# Patient Record
Sex: Male | Born: 1960 | Race: White | Hispanic: No | Marital: Married | Smoking: Former smoker
Health system: Southern US, Community
[De-identification: ages and names within clinical notes are randomized; demographics above are authoritative.]

## PROBLEM LIST (undated history)

## (undated) DIAGNOSIS — K219 Gastro-esophageal reflux disease without esophagitis: Secondary | ICD-10-CM

## (undated) DIAGNOSIS — M419 Scoliosis, unspecified: Secondary | ICD-10-CM

## (undated) DIAGNOSIS — Z87442 Personal history of urinary calculi: Secondary | ICD-10-CM

## (undated) DIAGNOSIS — E7800 Pure hypercholesterolemia, unspecified: Secondary | ICD-10-CM

## (undated) DIAGNOSIS — M1990 Unspecified osteoarthritis, unspecified site: Secondary | ICD-10-CM

## (undated) HISTORY — DX: Unspecified osteoarthritis, unspecified site: M19.90

## (undated) HISTORY — PX: HIP SURGERY: SHX245

## (undated) HISTORY — DX: Scoliosis, unspecified: M41.9

## (undated) HISTORY — PX: OTHER SURGICAL HISTORY: SHX169

---

## 1998-12-30 ENCOUNTER — Encounter: Payer: Self-pay | Admitting: Family Medicine

## 1998-12-30 ENCOUNTER — Ambulatory Visit (HOSPITAL_COMMUNITY): Admission: RE | Admit: 1998-12-30 | Discharge: 1998-12-30 | Payer: Self-pay | Admitting: Family Medicine

## 2004-03-09 ENCOUNTER — Encounter: Payer: Self-pay | Admitting: Orthopedic Surgery

## 2004-03-09 ENCOUNTER — Ambulatory Visit (HOSPITAL_COMMUNITY): Admission: RE | Admit: 2004-03-09 | Discharge: 2004-03-09 | Payer: Self-pay | Admitting: Family Medicine

## 2009-11-22 ENCOUNTER — Encounter: Payer: Self-pay | Admitting: Orthopedic Surgery

## 2009-11-30 ENCOUNTER — Ambulatory Visit: Payer: Self-pay | Admitting: Orthopedic Surgery

## 2009-11-30 DIAGNOSIS — M549 Dorsalgia, unspecified: Secondary | ICD-10-CM | POA: Insufficient documentation

## 2009-11-30 DIAGNOSIS — M1610 Unilateral primary osteoarthritis, unspecified hip: Secondary | ICD-10-CM | POA: Insufficient documentation

## 2009-11-30 DIAGNOSIS — M169 Osteoarthritis of hip, unspecified: Secondary | ICD-10-CM | POA: Insufficient documentation

## 2009-12-10 ENCOUNTER — Encounter (INDEPENDENT_AMBULATORY_CARE_PROVIDER_SITE_OTHER): Payer: Self-pay | Admitting: *Deleted

## 2009-12-13 ENCOUNTER — Encounter: Payer: Self-pay | Admitting: Orthopedic Surgery

## 2009-12-14 ENCOUNTER — Inpatient Hospital Stay (HOSPITAL_COMMUNITY): Admission: RE | Admit: 2009-12-14 | Discharge: 2009-12-17 | Payer: Self-pay | Admitting: Orthopedic Surgery

## 2009-12-14 ENCOUNTER — Ambulatory Visit: Payer: Self-pay | Admitting: Orthopedic Surgery

## 2009-12-17 ENCOUNTER — Encounter: Payer: Self-pay | Admitting: Orthopedic Surgery

## 2009-12-20 ENCOUNTER — Telehealth: Payer: Self-pay | Admitting: Orthopedic Surgery

## 2009-12-21 ENCOUNTER — Telehealth: Payer: Self-pay | Admitting: Orthopedic Surgery

## 2009-12-24 ENCOUNTER — Encounter: Payer: Self-pay | Admitting: Orthopedic Surgery

## 2009-12-27 ENCOUNTER — Encounter: Payer: Self-pay | Admitting: Orthopedic Surgery

## 2009-12-28 ENCOUNTER — Ambulatory Visit: Payer: Self-pay | Admitting: Orthopedic Surgery

## 2010-01-03 ENCOUNTER — Telehealth: Payer: Self-pay | Admitting: Orthopedic Surgery

## 2010-01-14 ENCOUNTER — Telehealth: Payer: Self-pay | Admitting: Orthopedic Surgery

## 2010-01-17 ENCOUNTER — Ambulatory Visit: Payer: Self-pay | Admitting: Orthopedic Surgery

## 2010-01-21 ENCOUNTER — Encounter: Payer: Self-pay | Admitting: Orthopedic Surgery

## 2010-01-26 ENCOUNTER — Ambulatory Visit: Payer: Self-pay | Admitting: Orthopedic Surgery

## 2010-03-02 ENCOUNTER — Encounter: Payer: Self-pay | Admitting: Orthopedic Surgery

## 2010-03-08 ENCOUNTER — Ambulatory Visit: Payer: Self-pay | Admitting: Orthopedic Surgery

## 2010-03-16 ENCOUNTER — Encounter: Payer: Self-pay | Admitting: Orthopedic Surgery

## 2010-03-25 ENCOUNTER — Encounter: Payer: Self-pay | Admitting: Orthopedic Surgery

## 2010-05-05 ENCOUNTER — Encounter: Payer: Self-pay | Admitting: Orthopedic Surgery

## 2010-05-10 ENCOUNTER — Telehealth (INDEPENDENT_AMBULATORY_CARE_PROVIDER_SITE_OTHER): Payer: Self-pay | Admitting: *Deleted

## 2010-05-16 ENCOUNTER — Telehealth: Payer: Self-pay | Admitting: Orthopedic Surgery

## 2011-01-05 NOTE — Medication Information (Signed)
Summary: RX Folder Faxed RX [place]  RX Folder Faxed RX [place]   Imported By: Cammie Sickle 12/27/2009 18:26:20  _____________________________________________________________________  External Attachment:    Type:   Image     Comment:   External Document

## 2011-01-05 NOTE — Letter (Signed)
Summary: [facility] correspondence  [facility] correspondence   Imported By: Cammie Sickle 03/28/2010 18:12:45  _____________________________________________________________________  External Attachment:    Type:   Image     Comment:   External Document

## 2011-01-05 NOTE — Medication Information (Signed)
Summary: Tax adviser   Imported By: Cammie Sickle 12/17/2009 13:56:13  _____________________________________________________________________  External Attachment:    Type:   Image     Comment:   External Document

## 2011-01-05 NOTE — Miscellaneous (Signed)
Summary: Home PT progress note  Home PT progress note   Imported By: Cammie Sickle 12/30/2009 14:55:17  _____________________________________________________________________  External Attachment:    Type:   Image     Comment:   External Document

## 2011-01-05 NOTE — Letter (Signed)
Summary: Generic Letter  [facility]  [address]  [place], [place] [address]   Phone: [phone]  Fax: [phone]    12/13/2009  Vital Signs:  Patient profile:   50 year old male Weight:      240 pounds Pulse rate:   74 / minute Resp:     16 per minute  Vitals Entered By: Fuller Canada MD (November 30, 2009 1:16 PM)  Visit Type:  Initial Consult  CC:  left hip pain.  History of Present Illness: I saw Azriel Bamberg in the office today for an initial visit.  He is a 50 years old man with the complaint of:  chief complaint: left hip pain, referral [facility] Bou[facility].  We have a 50 year old male who was seen at [facility] orthopaedics and advised to have a total hip replacement.  He followed here in our total joint class and was recommended for surgery [place] since this is is home   He complains of pain and functional activity loss for x1 year with pain in his LEFT thigh and groin worsened by crossing his leg.  This is associated with a limp.  He says he cannot run.  He tried some Aleve once a day did not get relief.  The quality of the pain is aching sharp deep and throbbing.  Although he has had some back pain in the past is not having any of any significance at this time.   03/02/09 pelvis xray for review, GOC.  No injections or therapy.  2005 L spine xray report for review.   Allergies (verified): No Known Drug Allergies  Past History:  Past Medical History: cholesterol triglycerides  Past Surgical History: vasectomy  Family History: Family History of Diabetes Family History Coronary Heart Disease male < 30 Family History of Arthritis  Social History: Patient is married.  unemployed 2 cigs per day no alcohol use tea and coffee daily  Review of Systems General:  Denies weight loss, weight gain, fever, chills, and fatigue. Cardiac :  Denies chest pain, angina, heart attack, heart failure, poor circulation, blood  clots, and phlebitis. Resp:  Denies short of breath, difficulty breathing, COPD, cough, and pneumonia. GI:  Denies nausea, vomiting, diarrhea, constipation, difficulty swallowing, ulcers, GERD, and reflux. GU:  Denies kidney failure, kidney transplant, kidney stones, burning, poor stream, testicular cancer, blood in urine, and . Neuro:  Complains of numbness; denies headache, dizziness, migraines, weakness, tremor, and unsteady walking. MS:  Complains of joint pain; denies rheumatoid arthritis, joint swelling, gout, bone cancer, osteoporosis, and . Endo:  Denies thyroid disease, goiter, and diabetes. Psych:  Denies depression, mood swings, anxiety, panic attack, bipolar, and schizophrenia. Derm:  Denies eczema, cancer, and itching. EENT:  Denies poor vision, cataracts, glaucoma, poor hearing, vertigo, ears ringing, sinusitis, hoarseness, toothaches, and bleeding gums. Immunology:  Denies seasonal allergies, sinus problems, and allergic to bee stings. Lymphatic:  Denies lymph node cancer and lymph edema.  Physical Exam  Additional Exam:  The patient is well developed and nourished, with normal grooming and hygiene. The body habitus is medium.  The pulses and perfusion were normal with normal color, temperature  and no swelling  The coordination and sensation were normal  The reflexes were normal In his 4 extremities  Skin normal x4 extremities  Lymph nodes normal in the groin and axillae.  Musculoskeletal exam  Gait analysis: He has a definite limp his LEFT leg is shorter and his foot progression angle is extremely external.  Upper extremities inspection was normal, no contractures were seen.  Muscle tone was normal.  No joint subluxation was noted.  Leg length evaluation the LEFT leg is actually longer when looking at it clinically comparing the malleolus however when measured from the ASIS to the medial malleolus the legs are equal.  Inspection revealed bilaterally externally  rotated limb but the LEFT contracted in external rotation.  Hip flexion 110 on the RIGHT 100 on the LEFT with pain.  He cannot internally rotate with the hip in flexion, his leg is stuck in external rotation.  Internal rotation is limited on the RIGHT as well but he does get about 20.  Motor exam normal in both limbs.  Hip stability normal in both limbs.    left leg is longer on clinical exam but measures equally on ASIS to med mall   no IR on left actuall has ext rot contracture    Impression & Recommendations:  Problem # 1:  HIP, ARTHRITIS, DEGEN./OSTEO (ICD-715.95) Assessment New  a pelvic x-ray and what I believe to be a lateral x-ray was obtained in March of 2010 at the previous orthopaedic office it shows that he has a deformed femoral head with decreased femoral head neck offset and osteoarthritis.  xrays of the back pelvic obliquity   Orders: New Patient Level IV (60454) Hip x-ray unilateral complete, minimum 2 views (73510)/LEFT hip.  Findings deformity of the LEFT femoral head with decreased femoral neck head offset, consistent with osteoarthritis.  Impression osteoarthritis  Problem # 2:  BACK PAIN (ICD-724.5) Assessment: New  Orders: New Patient Level IV (09811) Lumbosacral Spine ,2/3 views (72100) AP lateral, spot films, show a severe L5-S1 disc space narrowing mild spondylolisthesis with facet arthritis between L4 and S1. There is also a pelvic obliquity noted with the LEFT side being lower than the RIGHT.  Impression degenerative disc disease with pelvic obliquity.  Diagnosis osteoarthritis, LEFT hip, (degenerative disc disease L. spine).  Plan LEFT total hip arthroplasty with a metal head plastic cup, would like to get a 36 hip ball in with at least 8 mm of polyethylene. As far as the leg lengths. I will try to shorten approximately 1 cm.  Medications Added to Medication List This Visit: 1)  Fenofibrate 160 Mg Tabs (Fenofibrate) 2)  Lovaza 1 Gm Caps  (Omega-3-acid ethyl esters)  Patient Instructions: 1)  Informed consent process: I have discussed the procedure with the patient. I have answered their questions. The risks of bleeding, infection, nerve and vascualr injury have been discussed. The diagnosis and reason for surgery have been explained. The patient demonstrates understanding of this discussion. Specific to this procedure risks include: leg length abnormality, DVT, infection [would require 2 stage revision], limp, pain  2)  TOTAL HIP REPLACEMENTS: SOME PATIENTS DEVELOPE  3)  BLEEDING, INFECTION, DISLOCATION, and CLOTTING 4)  DOS 12/14/08 5)  PREOP 12/10/08 reg at [facility] short stay center 9:15 am 6)  POSTOP in our office on January 25th 7)    8)      Fuller Canada MD

## 2011-01-05 NOTE — Progress Notes (Signed)
Summary: patient elects to cancel appointment for tomorrow  Phone Note Call from Patient   Caller: Patient Summary of Call: Patient called to cancel the appointment for tomorrow 05/17/10, notes concerns about copay and bal's on previous medical expenses. Strongly advised to still come for the appointment, due to recent surgery; copay issue addressed, told no problem.  He said he definitely cannot come right now, but will call back to re-schedule. Initial call taken by: Cammie Sickle,  May 16, 2010 12:04 PM

## 2011-01-05 NOTE — Miscellaneous (Signed)
Summary: Authorization request in-patient surgery  Clinical Lists Changes   Contacted [employer] insurance re: authorization for in-patient surgery sched'd at [facility] 12/14/09 / CPT [address], Dx 715.95.  Per Health services/utilization, Fax clinicals to [phone] - Done.  (Ph [phone])  ** 12/13/10 Rec'd AUTH# [id number], per Gigi Gin, [employer],[facility]. Her direct ph# [phone],  Ext [phone]

## 2011-01-05 NOTE — Letter (Signed)
Summary: Out of Work  [facility]  [address]  [place], [place] [address]   Phone: [phone]  Fax: [phone]    December 28, 2009   Employee:  [employer]    To Whom It May Concern:   For Medical reasons, please excuse the above named employee from work for the following dates:  Start:   December 14, 2009  End/Medically cleared to return to work as follows:                 January 04, 2010, between 4 and 8 hours a day, as tolerated.  Patient/employee may need to leave work early if in pain.   If you need additional information, please feel free to contact our office.         Sincerely,    Terrance Mass, MD

## 2011-01-05 NOTE — Progress Notes (Signed)
Summary: d/c from homecare services  Phone Note Other Incoming   Summary of Call: [facility]  left a message that she discharaged Arthur Aguilar from homecare last Friday 12/31/09 because he is returning to work  this week . Initial call taken by: Jacklynn Ganong,  January 03, 2010 10:37 AM

## 2011-01-05 NOTE — Assessment & Plan Note (Signed)
Summary: POST OP 1/LT TOTAL HIP/[facility]   Visit Type:  Follow-up  CC:  post op .  History of Present Illness: DOS 12/14/09 left hip.  Procedure Left total hip replacement, Smith and Nephew implant.  Medication  Robaxin, lorcet plus helps.  Subjectives: left thigh muscle spasms, intermittent relief with robaxin   PT: [facility]  DVT: LOVENOX  [Still on Lovenox, insurance would only pay for 21 not 28 days worth]  measured LLD Left long 3/4 inch   incision looks great, staples out today  Assessment:  doing well; return to work Feb 1or 2  will do PT at home   Plan: xrays in 4 weeks  9/16 right shoe lift          Allergies (verified): No Known Drug Allergies   Impression & Recommendations:  Problem # 1:  HIP, ARTHRITIS, DEGEN./OSTEO (ICD-715.95) Assessment Improved     Orders: Post-Op Check (04540)  Problem # 2:  AFTERCARE FOLLOW SURGERY MUSCULOSKEL SYSTEM NEC (ICD-V58.78) Assessment: Improved  Orders: Post-Op Check (98119)

## 2011-01-05 NOTE — Medication Information (Signed)
Summary: Authorization for Lovenox  Authorization for Lovenox   Imported By: Jacklynn Ganong 01/05/2010 16:47:53  _____________________________________________________________________  External Attachment:    Type:   Image     Comment:   External Document

## 2011-01-05 NOTE — Letter (Signed)
Summary: Medical record release authorization  Medical record release authoarization   Imported By: Jacklynn Ganong 03/22/2010 10:19:28  _____________________________________________________________________  External Attachment:    Type:   Image     Comment:   External Document

## 2011-01-05 NOTE — Miscellaneous (Signed)
Summary: [facility] verbal order confirmation  [facility] verbal order confirmation   Imported By: Jacklynn Ganong 03/22/2010 08:56:42  _____________________________________________________________________  External Attachment:    Type:   Image     Comment:   External Document

## 2011-01-05 NOTE — Progress Notes (Signed)
Summary: wants a milder pain medicaine  Phone Note Call from Patient   Summary of Call: Arthur Aguilar ([birth year]) says the Oxycontin makes him feel weird, wants to know if you will prescribe a milder pain medicine. He uses [facility] in [place]. His # [phone] Initial call taken by: Jacklynn Ganong,  December 21, 2009 11:34 AM  Follow-up for Phone Call        lorcet plus 1 q 4 as needed pain  Follow-up by: Fuller Canada MD,  December 21, 2009 11:47 AM    New/Updated Medications: LORCET PLUS 7.5-650 MG TABS (HYDROCODONE-ACETAMINOPHEN) one by mouth every 4 hrs as needed pain Prescriptions: LORCET PLUS 7.5-650 MG TABS (HYDROCODONE-ACETAMINOPHEN) one by mouth every 4 hrs as needed pain  #60 x 2   Entered by:   Ether Griffins   Authorized by:   Fuller Canada MD   Signed by:   Ether Griffins on 12/21/2009   Method used:   Telephoned to ...         RxID:   [id number]

## 2011-01-05 NOTE — Miscellaneous (Signed)
Summary: [facility] dischsrge  [facility] dischsrge   Imported By: Jacklynn Ganong 01/21/2010 10:25:53  _____________________________________________________________________  External Attachment:    Type:   Image     Comment:   External Document

## 2011-01-05 NOTE — Letter (Signed)
Summary: Surgery order LT total hip  Surgery order LT total hip   Imported By: Cammie Sickle 12/17/2009 13:57:33  _____________________________________________________________________  External Attachment:    Type:   Image     Comment:   External Document

## 2011-01-05 NOTE — Progress Notes (Signed)
Summary: yeast infection  Phone Note Call from Patient Call back at [facility]   Summary of Call: patient has yeast infection on tongue and groin area, advised to call PCP, Dr was not availabel in our office today Initial call taken by: Ether Griffins,  December 20, 2009 3:59 PM

## 2011-01-05 NOTE — Miscellaneous (Signed)
Summary: [facility] orders  [facility] orders   Imported By: Jacklynn Ganong 01/07/2010 09:18:00  _____________________________________________________________________  External Attachment:    Type:   Image     Comment:   External Document

## 2011-01-05 NOTE — Assessment & Plan Note (Signed)
Summary: 6 WK RE-CK HIP FOL'G EXERCISES/POST OP THA 12/15/09/[facility]   Visit Type:  Follow-up  CC:  post op hip.  History of Present Illness: DOS 12/14/09 left hip.  Procedure Left total hip replacement, Smith and Nephew implant.  Medication  No meds for pain now, Tylenol as needed.  He has a 1 inch lift in his RIGHT shoe.  c/o of some lateral knee pain at times   Today is 6 week recheck left hip after exercises.  He is able to [place], garden and do most of his activities               Allergies: No Known Drug Allergies   Impression & Recommendations:  Problem # 1:  AFTERCARE FOLLOW SURGERY MUSCULOSKEL SYSTEM NEC (ICD-V58.78) Assessment Improved  Orders: Post-Op Check (09811)  Patient Instructions: 1)  come back in 3 months recheck left hip post op with xrays

## 2011-01-05 NOTE — Progress Notes (Signed)
Summary: question from patient  Phone Note Call from Patient   Caller: Patient Summary of Call: Patient called to inquire about discomfort on LT side in upper thigh area. (had total hip surgery earlier this year)  Primary care or schedule appt here? Cell [phone] Initial call taken by: Cammie Sickle,  May 10, 2010 1:42 PM  Follow-up for Phone Call        appt here Follow-up by: Ether Griffins,  May 11, 2010 8:39 AM  Additional Follow-up for Phone Call Additional follow up Details #1::        done,advised, sched appt. Additional Follow-up by: Cammie Sickle,  May 11, 2010 8:46 AM

## 2011-01-05 NOTE — Miscellaneous (Signed)
Summary: [facility] plan [facility] care  [facility]   Imported By: Jacklynn Ganong 05/19/2010 15:20:27  _____________________________________________________________________  External Attachment:    Type:   Image     Comment:   External Document

## 2011-01-05 NOTE — Assessment & Plan Note (Signed)
Summary: WOUND CHECK/POST OP THA/caf   Visit Type:  post op   CC:  rash.  History of Present Illness: DOS 12/14/09 left hip.  Procedure Left total hip replacement, Smith and Nephew implant.  Medication  Robaxin, lorcet plus helps. Not taking anymore  Subjectives: rash both legs   raised red papules no drainage   none on trunk or arms   PE Incision healed normally  no erythema in the legs just papules with red center  Use 2.5% cream two times a day   recheck in  ~ 2 weeks            Allergies: No Known Drug Allergies   Impression & Recommendations:  Problem # 1:  AFTERCARE FOLLOW SURGERY MUSCULOSKEL SYSTEM NEC (ICD-V58.78) Assessment Comment Only  Orders: Post-Op Check (81191)  Medications Added to Medication List This Visit: 1)  Hydrocortisone 2.5 % Lotn (Hydrocortisone) .... Apply to legs bid  Patient Instructions: 1)  keep previous aapointment  Prescriptions: HYDROCORTISONE 2.5 % LOTN (HYDROCORTISONE) apply to legs bid  #1 tube x 2   Entered and Authorized by:   Fuller Canada MD   Signed by:   Fuller Canada MD on 01/17/2010   Method used:   Print then Give to Patient   RxID:   [id number] HYDROCORTISONE 2.5 % LOTN (HYDROCORTISONE) apply to legs bid  #1 tube x 2   Entered and Authorized by:   Fuller Canada MD   Signed by:   Fuller Canada MD on 01/17/2010   Method used:   Print then Give to Patient   RxID:   [id number]

## 2011-01-05 NOTE — Assessment & Plan Note (Signed)
Summary: 4 WK RE-CK/XRAY,6 WK THA/POST OP SURG 12/14/09/[facility]   Visit Type:  Follow-up  CC:  post op.  History of Present Illness: DOS 12/14/09 left hip.  Procedure Left total hip replacement, Smith and Nephew implant.  Medication  Robaxin, lorcet plus helps, faxed refill over today for lorcet.  Today is a recheck with xrays.  complains of some stiffness in the LEFT hip.  He has some mild swelling in his LEFT ankle but most of his swelling has gone in the LEFT leg  His rashes improve with cortisone cream and [place] medication  His contrast and Benadryl as well  He has a 1 inch lift in his RIGHT shoe.    it is still difficult to assess why he needs such a high LEFT.  His x-rays show 8 mm length on the LEFT side.  He did have some lumbar scoliosis but it was mild.  I reassess his x-rays today and his head hip sooner seems to be normalized again with an 8 mm leg length discrepancy on the x-ray.  He'll continue with his inch lift he is given his precautions of external rotation extension.  We'll see him back again at 3 months postop.  X-rays today show good position of his cup with good anteversion.  Good position of the cup angle at 45.  Prosthesis looks excellent in terms of femoral fit and fill.  Impression normal postop per the total hip.          Allergies (verified): No Known Drug Allergies   Impression & Recommendations:  Problem # 1:  AFTERCARE FOLLOW SURGERY MUSCULOSKEL SYSTEM NEC (ICD-V58.78) Assessment Improved  Orders: Post-Op Check (14782) Hip x-ray unilateral complete, minimum 2 views (95621)  Problem # 2:  HIP, ARTHRITIS, DEGEN./OSTEO (ICD-715.95) Assessment: Improved  Orders: Post-Op Check (30865) Hip x-ray unilateral complete, minimum 2 views (78469)  Patient Instructions: 1)  Continue exercises for hip 2)  Come back in 6 weeks

## 2011-01-05 NOTE — Progress Notes (Signed)
Summary: call from patient about stockings  Phone Note Call from Patient   Caller: Patient Summary of Call: Called w/question about some itching and slight redness above the knee, and "slight red at top of foot, after taking off compression stockings last night.  He is 4 wks out, total hip surgery.  Nurse n/a today in our office. Cell Ph [phone]. Initial call taken by: Cammie Sickle,  January 14, 2010 11:10 AM  Follow-up for Phone Call        Just for review,  all wound problems need to be reviewed when the patient is a postop patient Follow-up by: Fuller Canada MD,  January 17, 2010 8:22 AM  Additional Follow-up for Phone Call Additional follow up Details #1::        scheduled for appointment today, Mon, 01/17/10 as has offered when we spoke w/patient Friday. Additional Follow-up by: Cammie Sickle,  January 17, 2010 8:58 AM

## 2011-02-19 LAB — CBC
HCT: 30.2 % — ABNORMAL LOW (ref 39.0–52.0)
HCT: 30.4 % — ABNORMAL LOW (ref 39.0–52.0)
HCT: 33.5 % — ABNORMAL LOW (ref 39.0–52.0)
Hemoglobin: 11.6 g/dL — ABNORMAL LOW (ref 13.0–17.0)
MCHC: 34.8 g/dL (ref 30.0–36.0)
MCV: 88.1 fL (ref 78.0–100.0)
Platelets: 211 10*3/uL (ref 150–400)
Platelets: 227 10*3/uL (ref 150–400)
Platelets: 253 10*3/uL (ref 150–400)
RBC: 3.42 MIL/uL — ABNORMAL LOW (ref 4.22–5.81)
RDW: 12.5 % (ref 11.5–15.5)
RDW: 12.6 % (ref 11.5–15.5)
WBC: 11.6 10*3/uL — ABNORMAL HIGH (ref 4.0–10.5)
WBC: 11.9 10*3/uL — ABNORMAL HIGH (ref 4.0–10.5)

## 2011-02-19 LAB — HEMOGLOBIN AND HEMATOCRIT, BLOOD
HCT: 43.7 % (ref 39.0–52.0)
Hemoglobin: 15.2 g/dL (ref 13.0–17.0)

## 2011-02-19 LAB — BASIC METABOLIC PANEL
BUN: 10 mg/dL (ref 6–23)
BUN: 11 mg/dL (ref 6–23)
BUN: 7 mg/dL (ref 6–23)
BUN: 8 mg/dL (ref 6–23)
CO2: 26 mEq/L (ref 19–32)
CO2: 28 mEq/L (ref 19–32)
Calcium: 9.8 mg/dL (ref 8.4–10.5)
Chloride: 100 mEq/L (ref 96–112)
Chloride: 99 mEq/L (ref 96–112)
Creatinine, Ser: 0.76 mg/dL (ref 0.4–1.5)
Creatinine, Ser: 0.79 mg/dL (ref 0.4–1.5)
Creatinine, Ser: 0.88 mg/dL (ref 0.4–1.5)
GFR calc Af Amer: >60 mL/min (ref >60)
GFR calc Af Amer: >60 mL/min (ref >60)
GFR calc non Af Amer: >60 mL/min (ref >60)
GFR calc non Af Amer: >60 mL/min (ref >60)
GFR calc non Af Amer: >60 mL/min (ref >60)
Glucose, Bld: 133 mg/dL — ABNORMAL HIGH (ref 70–99)
Glucose, Bld: 147 mg/dL — ABNORMAL HIGH (ref 70–99)
Glucose, Bld: 161 mg/dL — ABNORMAL HIGH (ref 70–99)
Potassium: 3.9 mEq/L (ref 3.5–5.1)
Potassium: 4 mEq/L (ref 3.5–5.1)
Potassium: 4.1 mEq/L (ref 3.5–5.1)
Potassium: 4.3 mEq/L (ref 3.5–5.1)
Sodium: 135 mEq/L (ref 135–145)
Sodium: 137 mEq/L (ref 135–145)

## 2011-02-19 LAB — PROTIME-INR: Prothrombin Time: 11.9 seconds (ref 11.6–15.2)

## 2011-02-19 LAB — CROSSMATCH: ABO/RH(D): O NEG

## 2011-02-19 LAB — ABO/RH: ABO/RH(D): O NEG

## 2011-08-17 ENCOUNTER — Encounter: Payer: Self-pay | Admitting: Orthopedic Surgery

## 2011-08-17 ENCOUNTER — Ambulatory Visit: Payer: Self-pay | Admitting: Orthopedic Surgery

## 2011-08-17 ENCOUNTER — Ambulatory Visit (INDEPENDENT_AMBULATORY_CARE_PROVIDER_SITE_OTHER): Payer: PRIVATE HEALTH INSURANCE | Admitting: Orthopedic Surgery

## 2011-08-17 VITALS — BP 126/86 | Ht 71.0 in | Wt 247.0 lb

## 2011-08-17 DIAGNOSIS — M7660 Achilles tendinitis, unspecified leg: Secondary | ICD-10-CM

## 2011-08-17 MED ORDER — NAPROXEN SODIUM 550 MG PO TABS: 550.0000 mg | ORAL_TABLET | Freq: Two times a day (BID) | ORAL | Status: DC

## 2011-08-17 NOTE — Progress Notes (Signed)
Pain RIGHT  50 year old male previous LEFT hip presents with pain and swelling on the back of his foot for 6 weeks.  He was treated with Anaprox did not improve  Complains of sharp throbbing stabbing burning and constant pain worse with walking improve with rest associated with swelling  Review of systems he snores he complains of some numbness and tingling which will evaluate later related to his back  Anxiety depression nervousness  Past Surgical History  Procedure Date  . Left hip replacement   . Hip surgery   . Dental bone implant     Past Medical History  Diagnosis Date  . Arthritis   . Scoliosis     Physical Exam(12) GENERAL: normal development   CDV: pulses are normal   Skin: normal  Lymph: nodes were not palpable/normal  Psychiatric: awake, alert and oriented  Neuro: normal sensation  MSK No noticeable ambulation abnormality. 1RIGHT heel is swollen and tender over the Achilles tendon with a characteristic nodule 2 Range of motion normal 3 Plantar flexion strength is normal ankle stable  Assessment: Achilles tendinitis    Plan:  Continue anti-inflammatory, Cam Walker, followup 2 months

## 2011-08-17 NOTE — Patient Instructions (Addendum)
Wear brace x 6 weeks  Achilles Tendonitis (Tendinitis) Tendonitis a swelling and soreness of the tendon. The pain in the tendon (cord like structure which attaches muscle to bone) is produced by tiny tears and the inflammation present in that tendon. It commonly occurs at the shoulders, heels, and elbows. It is usually caused by overusing the tendon and joint involved. Achilles tendonitis involves the Achilles tendon. This is the large tendon in the back of the leg just above the foot. It attaches the large muscles of the lower leg to the heel bone (called calcaneus).   This diagnosis (learning what is wrong) is made by examination.   HOME CARE INSTRUCTIONS  Apply ice to the injury for 20 minutes 2 times per day. Put the ice in a plastic bag and place a towel between the bag of ice and your skin.   Try to avoid use other than gentle range of motion while the tendon is painful. Do not resume use until instructed by your caregiver. Then begin use gradually. Do not increase use to the point of pain. If pain does develop, decrease use and continue the above measures. Gradually increase activities that do not cause discomfort until you gradually achieve normal use.   Only take over-the-counter or prescription medicines for pain, discomfort, or fever as directed by your caregiver.

## 2011-09-05 ENCOUNTER — Ambulatory Visit (INDEPENDENT_AMBULATORY_CARE_PROVIDER_SITE_OTHER): Payer: PRIVATE HEALTH INSURANCE | Admitting: Orthopedic Surgery

## 2011-09-05 ENCOUNTER — Encounter: Payer: Self-pay | Admitting: Orthopedic Surgery

## 2011-09-05 VITALS — Ht 71.0 in | Wt 247.0 lb

## 2011-09-05 DIAGNOSIS — Z96649 Presence of unspecified artificial hip joint: Secondary | ICD-10-CM

## 2011-09-05 NOTE — Patient Instructions (Signed)
Activity as tolerated

## 2011-09-06 NOTE — Progress Notes (Signed)
Annual followup visit. LEFT total hip.  Surgery date: December 04, 2009  No complaints.  Normal flexion, extension, internal, and internal rotation. The LEFT hip. Leg lengths are equal. Neurovascular exam is normal. Strength in the limbs is normal. Skin incision is intact. No signs of cellulitis or infection. No tenderness. Pulse and temperature in the limb are normal. Patient ambulates without assistive device.  Separate x-ray report Separately identifiable. X-ray report   AP, pelvis, AP LEFT hip. Crosstable lateral left  hip.  Reason for x-rays: Total hip follow up   The position of the implants are normal. The cup version is acceptable. Stem alignment is neutral.  Impression stable total hip arthroplasty

## 2011-10-09 ENCOUNTER — Other Ambulatory Visit: Payer: Self-pay | Admitting: Orthopedic Surgery

## 2011-10-09 ENCOUNTER — Other Ambulatory Visit: Payer: Self-pay | Admitting: *Deleted

## 2011-10-09 DIAGNOSIS — M7660 Achilles tendinitis, unspecified leg: Secondary | ICD-10-CM

## 2011-10-09 MED ORDER — NAPROXEN SODIUM 550 MG PO TABS
550.0000 mg | ORAL_TABLET | Freq: Two times a day (BID) | ORAL | Status: DC
Start: 2011-10-09 — End: 2011-10-09

## 2011-10-19 ENCOUNTER — Ambulatory Visit: Payer: PRIVATE HEALTH INSURANCE | Admitting: Orthopedic Surgery

## 2011-12-26 ENCOUNTER — Ambulatory Visit (INDEPENDENT_AMBULATORY_CARE_PROVIDER_SITE_OTHER): Payer: BC Managed Care – PPO | Admitting: Orthopedic Surgery

## 2011-12-26 ENCOUNTER — Encounter: Payer: Self-pay | Admitting: Orthopedic Surgery

## 2011-12-26 VITALS — BP 110/76 | Ht 70.0 in | Wt 250.0 lb

## 2011-12-26 DIAGNOSIS — M7661 Achilles tendinitis, right leg: Secondary | ICD-10-CM

## 2011-12-26 DIAGNOSIS — M7660 Achilles tendinitis, unspecified leg: Secondary | ICD-10-CM

## 2011-12-26 NOTE — Patient Instructions (Signed)
You have received a steroid shot. 15% of patients experience increased pain at the injection site with in the next 24 hours. This is best treated with ice and tylenol extra strength 2 tabs every 8 hours. If you are still having pain please call the office.    

## 2011-12-27 ENCOUNTER — Encounter: Payer: Self-pay | Admitting: Orthopedic Surgery

## 2011-12-27 DIAGNOSIS — M7661 Achilles tendinitis, right leg: Secondary | ICD-10-CM | POA: Insufficient documentation

## 2011-12-27 NOTE — Progress Notes (Signed)
Patient ID: Arthur Aguilar, male   DOB: [birth year], 51 y.o.   MRN: [id number]   Followup visit  Achilles tendinitis right lower chest remedy  Previous treatment Cam Walker, anti-inflammatories.  Pain is currently mild. The patient is able to perform all activities of daily living, recreational activities at work.  He is concerned about the swelling posterior to the Achilles. He seems to do better with an open back shoe however this is not possible when he is working.  He is worn thru 2 Cam Walker's.  Review of systems: Negative for numbness tingling or weakness of the right ankle  Exam There is persistent posterior bursal swelling and pump bump over the right Achilles tendon with tenderness. The tendon itself is intact and nontender. There is some swelling of the retrocalcaneal bursa. Range of motion remains normal, plantar flexion strength and dorsiflexion strength remains normal. Ankle stability is normal as well. Skin is otherwise intact pulses are normal and neurovascular exam is stable.  Impression improved Achilles tendinitis. Recommend retrocalcaneal injection.  Retrocalcaneal bursal injection Consent was obtained, timeout was completed.  Ethyl chloride for anesthesia Medications used 1% lidocaine 3 ML's, Depo-Medrol 40 mg.  Alcohol was used to prep the skin along with ethyl chloride which was used for anesthesia. A 25-gauge needle was used to inject lidocaine and Depo-Medrol. This was tolerated without complication  A Band-Aid was applied.

## 2012-01-07 ENCOUNTER — Other Ambulatory Visit: Payer: Self-pay | Admitting: Orthopedic Surgery

## 2012-04-10 ENCOUNTER — Other Ambulatory Visit: Payer: Self-pay | Admitting: Orthopedic Surgery

## 2012-08-09 ENCOUNTER — Ambulatory Visit
Admission: RE | Admit: 2012-08-09 | Discharge: 2012-08-09 | Disposition: A | Discharge status: Home / Self Care | Payer: BC Managed Care – PPO | Source: Ambulatory Visit | Attending: Family Medicine | Admitting: Family Medicine

## 2012-08-09 ENCOUNTER — Other Ambulatory Visit: Payer: Self-pay | Admitting: Family Medicine

## 2012-08-09 DIAGNOSIS — M79609 Pain in unspecified limb: Secondary | ICD-10-CM

## 2012-09-05 ENCOUNTER — Ambulatory Visit: Payer: PRIVATE HEALTH INSURANCE | Admitting: Orthopedic Surgery

## 2012-09-19 ENCOUNTER — Ambulatory Visit (INDEPENDENT_AMBULATORY_CARE_PROVIDER_SITE_OTHER): Payer: BC Managed Care – PPO

## 2012-09-19 ENCOUNTER — Encounter: Payer: Self-pay | Admitting: Orthopedic Surgery

## 2012-09-19 ENCOUNTER — Ambulatory Visit (INDEPENDENT_AMBULATORY_CARE_PROVIDER_SITE_OTHER): Payer: BC Managed Care – PPO | Admitting: Orthopedic Surgery

## 2012-09-19 VITALS — BP 110/70 | Ht 70.0 in | Wt 250.0 lb

## 2012-09-19 DIAGNOSIS — Z96649 Presence of unspecified artificial hip joint: Secondary | ICD-10-CM

## 2012-09-19 DIAGNOSIS — M79609 Pain in unspecified limb: Secondary | ICD-10-CM

## 2012-09-19 DIAGNOSIS — M79673 Pain in unspecified foot: Secondary | ICD-10-CM

## 2012-09-19 NOTE — Patient Instructions (Addendum)
activities as tolerated 

## 2012-09-19 NOTE — Progress Notes (Signed)
Patient ID: KARMAN VENEY, male   DOB: 1961/08/21, 51 y.o.   MRN: [id number] Chief Complaint  Patient presents with  . Follow-up    yearly follow up and xray left hip, DOS 12/14/09    Hip xrays today   No c/o pain   ROS: pain in heel on the right   Exam  Normal Gait  Normal Mental status  Left Hip Exam  Left hip exam is normal.  Tenderness  The patient is experiencing no tenderness.     Range of Motion  The patient has normal left hip ROM.  Muscle Strength  The patient has normal left hip strength.   Other  Erythema: absent Scars: present Sensation: normal Pulse: present    hip stable   xrays are normal   X rays in 1 years THA

## 2013-09-18 ENCOUNTER — Encounter: Payer: Self-pay | Admitting: Orthopedic Surgery

## 2013-09-18 ENCOUNTER — Ambulatory Visit (INDEPENDENT_AMBULATORY_CARE_PROVIDER_SITE_OTHER): Payer: BC Managed Care – PPO | Admitting: Orthopedic Surgery

## 2013-09-18 ENCOUNTER — Ambulatory Visit (INDEPENDENT_AMBULATORY_CARE_PROVIDER_SITE_OTHER): Payer: BC Managed Care – PPO

## 2013-09-18 VITALS — BP 110/74 | Ht 70.0 in | Wt 235.0 lb

## 2013-09-18 DIAGNOSIS — Z96642 Presence of left artificial hip joint: Secondary | ICD-10-CM

## 2013-09-18 DIAGNOSIS — Z96649 Presence of unspecified artificial hip joint: Secondary | ICD-10-CM

## 2013-09-18 NOTE — Progress Notes (Signed)
Patient ID: Arthur Aguilar, male   DOB: July 19, 1961, 52 y.o.   MRN: [id number]  Left tha 2011  F/u annual visit x-rays   No complaints  Ros foot pain resolved   Blood pressure 110/74, height 5\' 10"  (1.778 m), weight 235 lb (106.595 kg). General appearance is normal, the patient is alert and oriented x3 with normal mood and affect. Gait normal leg lengths normal  Range of motion pain free   xrays normal   Encounter Diagnoses  Name Primary?  . Status post left hip replacement Yes  . S/P hip replacement     F/u 2016 tha x rays

## 2013-09-18 NOTE — Patient Instructions (Signed)
Activity as tolerated

## 2013-12-08 ENCOUNTER — Telehealth: Payer: Self-pay | Admitting: Orthopedic Surgery

## 2013-12-08 NOTE — Telephone Encounter (Signed)
Marja Kays asked for a prescription for Hydrocodone

## 2013-12-08 NOTE — Telephone Encounter (Signed)
Routing to Dr Harrison 

## 2013-12-09 NOTE — Telephone Encounter (Signed)
His left hip and low back hurts when he cuts wood and takes it inside. 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  He takes arthritis strength Tylenol and it does not stop his pain

## 2013-12-09 NOTE — Telephone Encounter (Signed)
Find out what its for????

## 2013-12-10 NOTE — Telephone Encounter (Signed)
Start with ibuprofen and then if that doesn't work tramadol for narcotics please thanks

## 2013-12-10 NOTE — Telephone Encounter (Signed)
Patient informed of DR. Harrison's reply. 

## 2014-02-26 ENCOUNTER — Telehealth: Payer: Self-pay | Admitting: Orthopedic Surgery

## 2014-02-26 NOTE — Telephone Encounter (Signed)
Patient called to check on when he is due for his next re-check of left hip (follow/up total hip surgery 2011); verified and scheduled the "81-month fol/up" for 12/22/14.  He states he occasionally has a slight jab or noticeable sensation in the front of the left hip, without any different amount of activity.  Please advise if he needs to be seen prior to this appointment or if any recommendation. His ph# is [phone].

## 2014-03-02 NOTE — Telephone Encounter (Signed)
Routing to Dr Harrison 

## 2014-03-03 NOTE — Telephone Encounter (Signed)
WHEN WAS THE LAST APPT ?

## 2014-03-03 NOTE — Telephone Encounter (Signed)
OK, please bring in for x-rays of the hip

## 2014-03-03 NOTE — Telephone Encounter (Signed)
Last appointment here was:  [name], MD at 09/18/2013 9:03 AM     Status: Signed        Patient ID: Arthur Aguilar, male DOB: Jun 29, 1961, 53 y.o. MRN: [id number]  Left tha 2011  F/u annual visit x-rays  No complaints  Ros foot pain resolved  Blood pressure 110/74, height 5\' 10"  (1.778 m), weight 235 lb (106.595 kg).  General appearance is normal, the patient is alert and oriented x3 with normal mood and affect.  Gait normal leg lengths normal  Range of motion pain free  xrays normal     Encounter Diagnoses     Name  Primary?       Status post left hip replacement  Yes       S/P hip replacement      F/u 2016 tha x rays

## 2014-03-03 NOTE — Telephone Encounter (Signed)
Routing to Dr Harrison 

## 2014-03-04 NOTE — Telephone Encounter (Signed)
Called patient and scheduled appointment with Xray for first available.

## 2014-03-04 NOTE — Telephone Encounter (Signed)
Routing to Carol Foltz to schedule 

## 2014-03-23 ENCOUNTER — Ambulatory Visit (INDEPENDENT_AMBULATORY_CARE_PROVIDER_SITE_OTHER): Payer: BC Managed Care – PPO | Admitting: Orthopedic Surgery

## 2014-03-23 ENCOUNTER — Encounter: Payer: Self-pay | Admitting: Orthopedic Surgery

## 2014-03-23 ENCOUNTER — Ambulatory Visit (INDEPENDENT_AMBULATORY_CARE_PROVIDER_SITE_OTHER): Payer: BC Managed Care – PPO

## 2014-03-23 VITALS — BP 150/86 | Ht 70.0 in | Wt 235.0 lb

## 2014-03-23 DIAGNOSIS — Z96649 Presence of unspecified artificial hip joint: Secondary | ICD-10-CM

## 2014-03-23 DIAGNOSIS — M25552 Pain in left hip: Secondary | ICD-10-CM

## 2014-03-23 DIAGNOSIS — M25559 Pain in unspecified hip: Secondary | ICD-10-CM

## 2014-03-23 MED ORDER — HYDROCODONE-ACETAMINOPHEN 5-325 MG PO TABS: 1.0000 | ORAL_TABLET | Freq: Four times a day (QID) | ORAL | Status: DC | PRN

## 2014-03-23 NOTE — Progress Notes (Signed)
Patient ID: Arthur Aguilar, male   DOB: 1961/06/29, 53 y.o.   MRN: [id number]  Chief Complaint  Patient presents with  . Follow-up    Recheck left hip s/p THA 12/14/2009, intermittent pain    53 year old male 4 years postop total hip. Last evaluation in January of this year. X-rays normal. You came in for complaints of intermittent groin and pain depending on activity. He he tells [place] he can ride his motorcycle for 100s of miles without difficulty usually. 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 Has intermittent symptoms in his left buttock left greater trochanter and occasionally in his left groin  His pain is relieved partially with anti-inflammatory naproxen and more so with hydrocodone.  Denies catching locking giving way does have occasional back pain no numbness or tingling in the left leg  Past Medical History  Diagnosis Date  . Arthritis   . Scoliosis    Past Surgical History  Procedure Laterality Date  . Left hip replacement    . Hip surgery    . Dental bone implant       BP 150/86  Ht 5\' 10"  (1.778 m)  Wt 235 lb (106.595 kg)  BMI 33.72 kg/m2 Vital signs:   General the patient is well-developed and well-nourished grooming and hygiene are normal Oriented x3 Mood and affect normal Ambulation normal  Inspection of the left hip reveals hip flexion 125. Hip abduction 40. Hip flexion no pain log roll no pain hip joint stable motor exam normal he does have some tenderness over his left greater trochanter buttock no groin tenderness. Skin clean dry and intact Cardiovascular exam is normal Sensory exam normal  Today's film is compared to previous x-rays no change in position with no loosening or complications  Encounter Diagnoses  Name Primary?  . Status post hip replacement   . Pain in left hip Yes    There are no signs of complications. We will keep him on his regularly scheduled interval followups. For severe pain he  can take Norco for pain  Meds ordered this encounter  Medications  . HYDROcodone-acetaminophen (NORCO/VICODIN) 5-325 MG per tablet    Sig: Take 1 tablet by mouth every 6 (six) hours as needed for moderate pain.    Dispense:  30 tablet    Refill:  0

## 2014-12-22 ENCOUNTER — Ambulatory Visit (INDEPENDENT_AMBULATORY_CARE_PROVIDER_SITE_OTHER): Payer: BLUE CROSS/BLUE SHIELD

## 2014-12-22 ENCOUNTER — Ambulatory Visit (INDEPENDENT_AMBULATORY_CARE_PROVIDER_SITE_OTHER): Payer: BLUE CROSS/BLUE SHIELD | Admitting: Orthopedic Surgery

## 2014-12-22 ENCOUNTER — Encounter: Payer: Self-pay | Admitting: Orthopedic Surgery

## 2014-12-22 ENCOUNTER — Ambulatory Visit: Payer: BLUE CROSS/BLUE SHIELD

## 2014-12-22 VITALS — BP 125/78 | Ht 70.0 in | Wt 245.0 lb

## 2014-12-22 DIAGNOSIS — Z96649 Presence of unspecified artificial hip joint: Secondary | ICD-10-CM

## 2014-12-22 DIAGNOSIS — Z9660 Presence of unspecified orthopedic joint implant: Secondary | ICD-10-CM

## 2014-12-22 DIAGNOSIS — M1610 Unilateral primary osteoarthritis, unspecified hip: Secondary | ICD-10-CM

## 2014-12-22 DIAGNOSIS — G5602 Carpal tunnel syndrome, left upper limb: Secondary | ICD-10-CM

## 2014-12-22 DIAGNOSIS — M1990 Unspecified osteoarthritis, unspecified site: Secondary | ICD-10-CM

## 2014-12-22 NOTE — Progress Notes (Signed)
Chief Complaint  Patient presents with  . Follow-up    Yearly recheck with xray of left hip. DOS 12-14-09.    New problem pain paresthesias thumb index long finger for several months. The patient has noticed numbness and tingling difficulty-which requires him to hold his arm down to get flow and feeling back to the tips of his fingers. No trauma. He did wear a splint but intermittently.  His hip shows no problems  Review of systems neurologic symptoms as stated in the history. Musculoskeletal complaints weakness in the right hand. No problems with his left hip. Neurovascularity problems no problems with cold weather  BP 125/78 mmHg  Ht 5\' 10"  (1.778 m)  Wt 245 lb (111.131 kg)  BMI 35.15 kg/m2 His appearance is normal is awake alert and oriented 3 his mood and affect is normal his gait and station are unremarkable. He has numbness and tingling in the tips of his index long finger and thumb no weakness really power grip range of motion of the wrist and hand are normal the wrist is stable. Scans intact color looks good pulses are normal in the radial ulnar artery no lymphadenopathy. Sensory losses is noted.  Left and right hip range of motion are normal he has some obligatory external rotation in both hips leg lengths are equal range of motion in hips normal hip stable. Muscle tone is excellent.  X-ray shows stable hip prosthesis with no complications or loosening  Recommend vitamin B 600 mg twice a day for 6 weeks continue night splint for 6 weeks  Follow-up one year for repeat total hip x-ray

## 2015-02-09 ENCOUNTER — Ambulatory Visit (INDEPENDENT_AMBULATORY_CARE_PROVIDER_SITE_OTHER): Payer: Managed Care, Other (non HMO) | Admitting: Orthopedic Surgery

## 2015-02-09 ENCOUNTER — Telehealth: Payer: Self-pay | Admitting: Orthopedic Surgery

## 2015-02-09 ENCOUNTER — Encounter: Payer: Self-pay | Admitting: Orthopedic Surgery

## 2015-02-09 VITALS — BP 132/81 | Ht 70.0 in | Wt 245.0 lb

## 2015-02-09 DIAGNOSIS — G5601 Carpal tunnel syndrome, right upper limb: Secondary | ICD-10-CM

## 2015-02-09 NOTE — Patient Instructions (Addendum)
Call back when ready to schedule surgery Right carpal tunnel release  Carpal Tunnel Release Carpal tunnel release is done to relieve the pressure on the nerves and tendons on the bottom side of your wrist.  LET YOUR CAREGIVER KNOW ABOUT:   Allergies to food or medicine.  Medicines taken, including vitamins, herbs, eyedrops, over-the-counter medicines, and creams.  Use of steroids (by mouth or creams).  Previous problems with anesthetics or numbing medicines.  History of bleeding problems or blood clots.  Previous surgery.  Other health problems, including diabetes and kidney problems.  Possibility of pregnancy, if this applies. RISKS AND COMPLICATIONS  Some problems that may happen after this procedure include:  Infection.  Damage to the nerves, arteries or tendons could occur. This would be very uncommon.  Bleeding. BEFORE THE PROCEDURE   This surgery may be done while you are asleep (general anesthetic) or may be done under a block where only your forearm and the surgical area is numb.  If the surgery is done under a block, the numbness will gradually wear off within several hours after surgery. HOME CARE INSTRUCTIONS   Have a responsible person with you for 24 hours.  Do not drive a car or use public transportation for 24 hours.  Only take over-the-counter or prescription medicines for pain, discomfort, or fever as directed by your caregiver. Take them as directed.  You may put ice on the palm side of the affected wrist.  Put ice in a plastic bag.  Place a towel between your skin and the bag.  Leave the ice on for 20 to 30 minutes, 4 times per day.  If you were given a splint to keep your wrist from bending, use it as directed. It is important to wear the splint at night or as directed. Use the splint for as long as you have pain or numbness in your hand, arm, or wrist. This may take 1 to 2 months.  Keep your hand raised (elevated) above the level of your heart  as much as possible. This keeps swelling down and helps with discomfort.  Change bandages (dressings) as directed.  Keep the wound clean and dry. SEEK MEDICAL CARE IF:   You develop pain not relieved with medications.  You develop numbness of your hand.  You develop bleeding from your surgical site.  You have an oral temperature above 102 F (38.9 C).  You develop redness or swelling of the surgical site.  You develop new, unexplained problems. SEEK IMMEDIATE MEDICAL CARE IF:   You develop a rash.  You have difficulty breathing.  You develop any reaction or side effects to medications given. Document Released: 02/10/2004 Document Revised: 02/12/2012 Document Reviewed: 09/26/2007 [facility] Patient Information 2015 [place], [place]. This information is not intended to replace advice given to you by your health care provider. Make sure you discuss any questions you have with your health care provider.

## 2015-02-09 NOTE — Telephone Encounter (Signed)
Patient called to inquire about surgery dates - he had asked about Friday 03/26/15, however, since Dr Aline Brochure will be away that week, I relayed that would not be a possible date. Asking if Dr Aline Brochure will be operating prior to that week - Friday, 03/19/15?  His ph# is [phone]

## 2015-02-09 NOTE — Progress Notes (Signed)
New problem established patient  Patient ID: UNO ESAU, male   DOB: 02-Apr-1961, 54 y.o.   MRN: [id number]  Chief Complaint  Patient presents with  . Carpal Tunnel    CTS in right hand for 3 months.     Arthur Aguilar is a 54 y.o. male.   HPI 54 year old male 3 month history of burning pain numbness and tingling thumb index long finger right hand. 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 Pain is become constant he wore a brace and took vitamin B 6 to no avail. His pain is 5-8 out of 10 and has become constant. He drops things he can't hold onto things. Review of Systems He does complain of some occasional night sweats he has a cracked tooth no infection and needs to be pulled. Other than the hand numbness and tingling no other systems are positive  Past Medical History  Diagnosis Date  . Arthritis   . Scoliosis     Past Surgical History  Procedure Laterality Date  . Left hip replacement    . Hip surgery    . Dental bone implant      Family History  Problem Relation Age of Onset  . Heart disease    . Arthritis    . Lung disease    . Diabetes      Social History History  Substance Use Topics  . Smoking status: Never Smoker   . Smokeless tobacco: Not on file  . Alcohol Use: No    No Known Allergies  Current Outpatient Prescriptions  Medication Sig Dispense Refill  . fenofibrate 160 MG tablet     . HYDROcodone-acetaminophen (NORCO/VICODIN) 5-325 MG per tablet Take 1 tablet by mouth every 6 (six) hours as needed for moderate pain. 30 tablet 0  . naproxen sodium (ANAPROX) 550 MG tablet TAKE 1 TABLET TWICE A DAY WITH MEALS 60 tablet 1  . naproxen sodium (ANAPROX) 550 MG tablet TAKE 1 TABLET TWICE A DAY 60 tablet 2  . terbinafine (LAMISIL) 250 MG tablet      No current facility-administered medications for this visit.       Physical Exam Blood pressure 132/81, height 5\' 10"  (1.778 m), weight 245 lb (111.131 kg). Physical Exam The  patient is well developed well nourished and well groomed.  Orientation to person place and time is normal  Mood is pleasant. Ambulatory status normal   Right hand and upper extremity evaluation he does have some tenderness in the distal forearm proximal to the wrist crease tenderness over the carpal tunnel. Normal range of motion in his hand. Wrist joint stable. Grip strength normal. Skin normal. Pulse and color normal. Sensation decreased sensation at the tips of the thumb index and long finger. Positive carpal tunnel compression test. Lymph nodes epitrochlear area normal.  Data Reviewed No additional data  Assessment Right carpal tunnel syndrome, he is failed conservative treatment. We did make him aware that if he continues to same work he is likely to have recurrence. We did advise ergonomic keyboard after surgery. He is recommended to take 2 weeks off after surgery. Plan Right carpal tunnel release at his convenience.

## 2015-03-12 ENCOUNTER — Other Ambulatory Visit: Payer: Self-pay | Admitting: *Deleted

## 2015-03-19 ENCOUNTER — Telehealth: Payer: Self-pay | Admitting: Orthopedic Surgery

## 2015-03-19 NOTE — Telephone Encounter (Signed)
Regarding out-patient surgery scheduled at [facility] 04/02/15, CPT [phone], contacted insurer, [employer] at [phone]; per automated voice response system, for participating providers, no pre-authorization is required.  Reference# [id number], 03/19/15, 1:20p.m.

## 2015-03-29 NOTE — Patient Instructions (Signed)
Arthur Aguilar  03/29/2015   Your procedure is scheduled on:  04/02/2015  Report to [name] at 6:15 AM.  Call this number if you have problems the morning of surgery: [phone]   Remember:   Do not eat food or drink liquids after midnight.   Take these medicines the morning of surgery with A SIP OF WATER: Hydrocodone   Do not wear jewelry, make-up or nail polish.  Do not wear lotions, powders, or perfumes. You may wear deodorant.  Do not shave 48 hours prior to surgery. Men may shave face and neck.  Do not bring valuables to the hospital.  [facility] is not responsible for any belongings or valuables.               Contacts, dentures or bridgework may not be worn into surgery.  Leave suitcase in the car. After surgery it may be brought to your room.  For patients admitted to the hospital, discharge time is determined by your treatment team.               Patients discharged the day of surgery will not be allowed to drive home.  Name and phone number of your driver:   Special Instructions: Shower using CHG 2 nights before surgery and the night before surgery.  If you shower the day of surgery use CHG.  Use special wash - you have one bottle of CHG for all showers.  You should use approximately 1/3 of the bottle for each shower.   Please read over the following fact sheets that you were given: Surgical Site Infection Prevention and Anesthesia Post-op Instructions     PATIENT INSTRUCTIONS POST-ANESTHESIA  IMMEDIATELY FOLLOWING SURGERY:  Do not drive or operate machinery for the first twenty four hours after surgery.  Do not make any important decisions for twenty four hours after surgery or while taking narcotic pain medications or sedatives.  If you develop intractable nausea and vomiting or a severe headache please notify your doctor immediately.  FOLLOW-UP:  Please make an appointment with your surgeon as instructed. You do not need to follow up with anesthesia unless  specifically instructed to do so.  WOUND CARE INSTRUCTIONS (if applicable):  Keep a dry clean dressing on the anesthesia/puncture wound site if there is drainage.  Once the wound has quit draining you may leave it open to air.  Generally you should leave the bandage intact for twenty four hours unless there is drainage.  If the epidural site drains for more than 36-48 hours please call the anesthesia department.  QUESTIONS?:  Please feel free to call your physician or the hospital operator if you have any questions, and they will be happy to assist you.      Carpal Tunnel Release Carpal tunnel release is done to relieve the pressure on the nerves and tendons on the bottom side of your wrist.  LET YOUR CAREGIVER KNOW ABOUT:   Allergies to food or medicine.  Medicines taken, including vitamins, herbs, eyedrops, over-the-counter medicines, and creams.  Use of steroids (by mouth or creams).  Previous problems with anesthetics or numbing medicines.  History of bleeding problems or blood clots.  Previous surgery.  Other health problems, including diabetes and kidney problems.  Possibility of pregnancy, if this applies. RISKS AND COMPLICATIONS  Some problems that may happen after this procedure include:  Infection.  Damage to the nerves, arteries or tendons could occur. This would be very uncommon.  Bleeding. BEFORE THE PROCEDURE  This surgery may be done while you are asleep (general anesthetic) or may be done under a block where only your forearm and the surgical area is numb.  If the surgery is done under a block, the numbness will gradually wear off within several hours after surgery. HOME CARE INSTRUCTIONS   Have a responsible person with you for 24 hours.  Do not drive a car or use public transportation for 24 hours.  Only take over-the-counter or prescription medicines for pain, discomfort, or fever as directed by your caregiver. Take them as directed.  You may put ice  on the palm side of the affected wrist.  Put ice in a plastic bag.  Place a towel between your skin and the bag.  Leave the ice on for 20 to 30 minutes, 4 times per day.  If you were given a splint to keep your wrist from bending, use it as directed. It is important to wear the splint at night or as directed. Use the splint for as long as you have pain or numbness in your hand, arm, or wrist. This may take 1 to 2 months.  Keep your hand raised (elevated) above the level of your heart as much as possible. This keeps swelling down and helps with discomfort.  Change bandages (dressings) as directed.  Keep the wound clean and dry. SEEK MEDICAL CARE IF:   You develop pain not relieved with medications.  You develop numbness of your hand.  You develop bleeding from your surgical site.  You have an oral temperature above 102 F (38.9 C).  You develop redness or swelling of the surgical site.  You develop new, unexplained problems. SEEK IMMEDIATE MEDICAL CARE IF:   You develop a rash.  You have difficulty breathing.  You develop any reaction or side effects to medications given. Document Released: 02/10/2004 Document Revised: 02/12/2012 Document Reviewed: 09/26/2007 [facility] Patient Information 2015 [place], [place]. This information is not intended to replace advice given to you by your health care provider. Make sure you discuss any questions you have with your health care provider.

## 2015-03-30 ENCOUNTER — Encounter (HOSPITAL_COMMUNITY): Payer: Self-pay

## 2015-03-30 ENCOUNTER — Other Ambulatory Visit: Payer: Self-pay

## 2015-03-30 ENCOUNTER — Encounter (HOSPITAL_COMMUNITY)
Admission: RE | Admit: 2015-03-30 | Discharge: 2015-03-30 | Disposition: A | Discharge status: Home / Self Care | Payer: Managed Care, Other (non HMO) | Source: Ambulatory Visit | Attending: Orthopedic Surgery | Admitting: Orthopedic Surgery

## 2015-03-30 DIAGNOSIS — G5601 Carpal tunnel syndrome, right upper limb: Secondary | ICD-10-CM | POA: Diagnosis present

## 2015-03-30 DIAGNOSIS — Z87891 Personal history of nicotine dependence: Secondary | ICD-10-CM | POA: Diagnosis not present

## 2015-03-30 DIAGNOSIS — M419 Scoliosis, unspecified: Secondary | ICD-10-CM | POA: Diagnosis not present

## 2015-03-30 LAB — CBC
HEMATOCRIT: 46 % (ref 39.0–52.0)
Hemoglobin: 15.9 g/dL (ref 13.0–17.0)
MCH: 29.9 pg (ref 26.0–34.0)
MCHC: 34.6 g/dL (ref 30.0–36.0)
MCV: 86.6 fL (ref 78.0–100.0)
Platelets: 248 10*3/uL (ref 150–400)
RBC: 5.31 MIL/uL (ref 4.22–5.81)
RDW: 13 % (ref 11.5–15.5)
WBC: 8.2 10*3/uL (ref 4.0–10.5)

## 2015-03-30 LAB — BASIC METABOLIC PANEL
Anion gap: 8 (ref 5–15)
BUN: 16 mg/dL (ref 6–23)
CALCIUM: 9.9 mg/dL (ref 8.4–10.5)
CO2: 28 mmol/L (ref 19–32)
CREATININE: 1.05 mg/dL (ref 0.50–1.35)
Chloride: 104 mmol/L (ref 96–112)
GFR calc Af Amer: >90 mL/min (ref >90)
GFR, EST NON AFRICAN AMERICAN: 79 mL/min — AB (ref >90)
GLUCOSE: 123 mg/dL — AB (ref 70–99)
Potassium: 4.1 mmol/L (ref 3.5–5.1)
SODIUM: 140 mmol/L (ref 135–145)

## 2015-03-30 NOTE — Pre-Procedure Instructions (Signed)
Patient given information to sign up for my chart at home. 

## 2015-04-01 NOTE — H&P (Signed)
Arthur Aguilar is an 54 y.o. male.   Chief Complaint: Carpal tunnel syndrome of the right hand HPI: The patient presented to me several months ago with new pain and paresthesias in his thumb index and long finger which she had for several months prior to that. He noticed numbness and tingling with difficulty which required him to hold his hand down to get flow and feeling back to his fingertips. He did not have any trauma. Or splint but intermittently. He was eventually treated with vitamin B 6 and splinting and did not improve and can complained and wanted carpal tunnel release  Past Medical History  Diagnosis Date  . Arthritis   . Scoliosis     Past Surgical History  Procedure Laterality Date  . Left hip replacement    . Hip surgery    . Dental bone implant      bottom - right jaw.    Family History  Problem Relation Age of Onset  . Heart disease    . Arthritis    . Lung disease    . Diabetes     Social History:  reports that he quit smoking about 6 years ago. His smoking use included Cigarettes. He has a 12.5 pack-year smoking history. He does not have any smokeless tobacco history on file. He reports that he drinks alcohol. He reports that he does not use illicit drugs.  Allergies: No Known Allergies  No prescriptions prior to admission    No results found for this or any previous visit (from the past 48 hour(s)). No results found.  ROS Musculoskeletal complaints including weakness in the right hand is left total hip is functioning well his review of systems is otherwise normal  There were no vitals taken for this visit. Physical Exam   last vital signs showed a blood pressure 125/78 height 510 weight 245  His appearance is normal his body habitus is mesomorphic, is oriented 3, mood and affect are normal, gait and station are normal  Has tenderness over the carpal tunnel positive for carpal tunnel compression test positive Phalen's test decreased sensation in the median  nerve distribution normal range of motion in the hand stability of the wrist was normal no grip strength weakness was detected skin was intact sensation is described pulses were good color was normal  Assessment/Plan Right carpal tunnel syndrome failed conservative treatment  Recommend right carpal tunnel release patient aware of the risk benefits of the procedure  [name] 04/01/2015, 4:36 PM

## 2015-04-02 ENCOUNTER — Ambulatory Visit (HOSPITAL_COMMUNITY): Payer: Managed Care, Other (non HMO) | Admitting: Anesthesiology

## 2015-04-02 ENCOUNTER — Encounter (HOSPITAL_COMMUNITY): Payer: Self-pay

## 2015-04-02 ENCOUNTER — Ambulatory Visit (HOSPITAL_COMMUNITY)
Admission: RE | Admit: 2015-04-02 | Discharge: 2015-04-02 | Disposition: A | Discharge status: Home / Self Care | Payer: Managed Care, Other (non HMO) | Source: Ambulatory Visit | Attending: Orthopedic Surgery | Admitting: Orthopedic Surgery

## 2015-04-02 ENCOUNTER — Encounter (HOSPITAL_COMMUNITY)
Admission: RE | Disposition: A | Discharge status: Home / Self Care | Payer: Self-pay | Source: Ambulatory Visit | Attending: Orthopedic Surgery

## 2015-04-02 DIAGNOSIS — G5601 Carpal tunnel syndrome, right upper limb: Secondary | ICD-10-CM | POA: Insufficient documentation

## 2015-04-02 DIAGNOSIS — Z96649 Presence of unspecified artificial hip joint: Secondary | ICD-10-CM

## 2015-04-02 DIAGNOSIS — M419 Scoliosis, unspecified: Secondary | ICD-10-CM | POA: Insufficient documentation

## 2015-04-02 DIAGNOSIS — Z87891 Personal history of nicotine dependence: Secondary | ICD-10-CM | POA: Insufficient documentation

## 2015-04-02 HISTORY — PX: CARPAL TUNNEL RELEASE: SHX101

## 2015-04-02 SURGERY — CARPAL TUNNEL RELEASE
Anesthesia: Regional | Site: Hand | Laterality: Right

## 2015-04-02 MED ORDER — MIDAZOLAM HCL 5 MG/5ML IJ SOLN
INTRAMUSCULAR | Status: DC | PRN
  Administered 2015-04-02 (×2): 1 mg via INTRAVENOUS

## 2015-04-02 MED ORDER — ONDANSETRON HCL 4 MG/2ML IJ SOLN
4.0000 mg | Freq: Once | INTRAMUSCULAR | Status: DC
  Filled 2015-04-02: qty 2

## 2015-04-02 MED ORDER — MIDAZOLAM HCL 2 MG/2ML IJ SOLN
INTRAMUSCULAR | Status: AC
  Filled 2015-04-02: qty 2

## 2015-04-02 MED ORDER — PROPOFOL 10 MG/ML IV BOLUS
INTRAVENOUS | Status: AC
  Filled 2015-04-02: qty 20

## 2015-04-02 MED ORDER — PROPOFOL INFUSION 10 MG/ML OPTIME
INTRAVENOUS | Status: DC | PRN
  Administered 2015-04-02: 08:00:00 via INTRAVENOUS
  Administered 2015-04-02: 125 ug/kg/min via INTRAVENOUS

## 2015-04-02 MED ORDER — CHLORHEXIDINE GLUCONATE 4 % EX LIQD: 60.0000 mL | Freq: Once | CUTANEOUS | Status: DC

## 2015-04-02 MED ORDER — LACTATED RINGERS IV SOLN
INTRAVENOUS | Status: DC
  Administered 2015-04-02: 07:00:00 via INTRAVENOUS

## 2015-04-02 MED ORDER — FENTANYL CITRATE (PF) 100 MCG/2ML IJ SOLN
INTRAMUSCULAR | Status: AC
  Filled 2015-04-02: qty 2

## 2015-04-02 MED ORDER — LIDOCAINE HCL (PF) 1 % IJ SOLN
INTRAMUSCULAR | Status: AC
Start: 2015-04-02 — End: 2015-04-02
  Filled 2015-04-02: qty 5

## 2015-04-02 MED ORDER — ONDANSETRON HCL 4 MG/2ML IJ SOLN: 4.0000 mg | Freq: Once | INTRAMUSCULAR | Status: DC | PRN

## 2015-04-02 MED ORDER — FENTANYL CITRATE (PF) 100 MCG/2ML IJ SOLN
INTRAMUSCULAR | Status: DC | PRN
  Administered 2015-04-02 (×4): 25 ug via INTRAVENOUS

## 2015-04-02 MED ORDER — MIDAZOLAM HCL 2 MG/2ML IJ SOLN
1.0000 mg | INTRAMUSCULAR | Status: DC | PRN
  Administered 2015-04-02: 2 mg via INTRAVENOUS

## 2015-04-02 MED ORDER — CEFAZOLIN SODIUM-DEXTROSE 2-3 GM-% IV SOLR
INTRAVENOUS | Status: AC
  Filled 2015-04-02: qty 50

## 2015-04-02 MED ORDER — IBUPROFEN 800 MG PO TABS: 800.0000 mg | ORAL_TABLET | Freq: Three times a day (TID) | ORAL | Status: DC | PRN

## 2015-04-02 MED ORDER — CEFAZOLIN SODIUM-DEXTROSE 2-3 GM-% IV SOLR
2.0000 g | INTRAVENOUS | Status: AC
  Administered 2015-04-02: 2 g via INTRAVENOUS

## 2015-04-02 MED ORDER — BUPIVACAINE HCL (PF) 0.5 % IJ SOLN
INTRAMUSCULAR | Status: AC
  Filled 2015-04-02: qty 30

## 2015-04-02 MED ORDER — KETOROLAC TROMETHAMINE 30 MG/ML IJ SOLN
INTRAMUSCULAR | Status: AC
  Filled 2015-04-02: qty 1

## 2015-04-02 MED ORDER — FENTANYL CITRATE (PF) 100 MCG/2ML IJ SOLN
25.0000 ug | INTRAMUSCULAR | Status: AC
  Administered 2015-04-02 (×2): 25 ug via INTRAVENOUS
  Filled 2015-04-02: qty 2

## 2015-04-02 MED ORDER — HYDROCODONE-ACETAMINOPHEN 5-325 MG PO TABS
2.0000 | ORAL_TABLET | Freq: Once | ORAL | Status: AC
  Administered 2015-04-02: 2 via ORAL

## 2015-04-02 MED ORDER — SUCCINYLCHOLINE CHLORIDE 20 MG/ML IJ SOLN
INTRAMUSCULAR | Status: AC
  Filled 2015-04-02: qty 1

## 2015-04-02 MED ORDER — LIDOCAINE HCL (CARDIAC) 10 MG/ML IV SOLN
INTRAVENOUS | Status: DC | PRN
  Administered 2015-04-02: 25 mg via INTRAVENOUS

## 2015-04-02 MED ORDER — EPHEDRINE SULFATE 50 MG/ML IJ SOLN
INTRAMUSCULAR | Status: AC
  Filled 2015-04-02: qty 1

## 2015-04-02 MED ORDER — BUPIVACAINE-EPINEPHRINE (PF) 0.5% -1:200000 IJ SOLN
INTRAMUSCULAR | Status: AC
  Filled 2015-04-02: qty 30

## 2015-04-02 MED ORDER — SODIUM CHLORIDE 0.9 % IJ SOLN
INTRAMUSCULAR | Status: AC
  Filled 2015-04-02: qty 3

## 2015-04-02 MED ORDER — KETOROLAC TROMETHAMINE 30 MG/ML IJ SOLN
30.0000 mg | Freq: Once | INTRAMUSCULAR | Status: AC
  Administered 2015-04-02: 30 mg via INTRAVENOUS

## 2015-04-02 MED ORDER — HYDROCODONE-ACETAMINOPHEN 5-325 MG PO TABS
ORAL_TABLET | ORAL | Status: AC
  Filled 2015-04-02: qty 2

## 2015-04-02 MED ORDER — SODIUM CHLORIDE 0.9 % IJ SOLN
INTRAMUSCULAR | Status: AC
  Filled 2015-04-02: qty 10

## 2015-04-02 MED ORDER — LIDOCAINE HCL (PF) 0.5 % IJ SOLN
INTRAMUSCULAR | Status: AC
  Filled 2015-04-02: qty 50

## 2015-04-02 MED ORDER — 0.9 % SODIUM CHLORIDE (POUR BTL) OPTIME
TOPICAL | Status: DC | PRN
  Administered 2015-04-02: 1000 mL

## 2015-04-02 MED ORDER — LIDOCAINE HCL (PF) 0.5 % IJ SOLN
INTRAMUSCULAR | Status: DC | PRN
  Administered 2015-04-02: 50 mL

## 2015-04-02 MED ORDER — LACTATED RINGERS IV SOLN
INTRAVENOUS | Status: DC | PRN
  Administered 2015-04-02: 07:00:00 via INTRAVENOUS

## 2015-04-02 MED ORDER — BUPIVACAINE HCL (PF) 0.5 % IJ SOLN
INTRAMUSCULAR | Status: DC | PRN
  Administered 2015-04-02: 10 mL

## 2015-04-02 MED ORDER — HYDROCODONE-ACETAMINOPHEN 5-325 MG PO TABS: 1.0000 | ORAL_TABLET | ORAL | Status: DC | PRN

## 2015-04-02 MED ORDER — FENTANYL CITRATE (PF) 100 MCG/2ML IJ SOLN: 25.0000 ug | INTRAMUSCULAR | Status: DC | PRN

## 2015-04-02 SURGICAL SUPPLY — 34 items
BAG HAMPER (MISCELLANEOUS) ×3 IMPLANT
BANDAGE ELASTIC 3 VELCRO NS (GAUZE/BANDAGES/DRESSINGS) ×3 IMPLANT
BANDAGE ESMARK 4X12 BL STRL LF (DISPOSABLE) ×1 IMPLANT
BLADE SURG 15 STRL LF DISP TIS (BLADE) ×2 IMPLANT
BLADE SURG 15 STRL SS (BLADE) ×4
BNDG COHESIVE 4X5 TAN STRL (GAUZE/BANDAGES/DRESSINGS) ×3 IMPLANT
BNDG ESMARK 4X12 BLUE STRL LF (DISPOSABLE) ×3
BNDG GAUZE ELAST 4 BULKY (GAUZE/BANDAGES/DRESSINGS) ×3 IMPLANT
CHLORAPREP W/TINT 26ML (MISCELLANEOUS) ×3 IMPLANT
CLOTH BEACON ORANGE TIMEOUT ST (SAFETY) ×3 IMPLANT
COVER LIGHT HANDLE STERIS (MISCELLANEOUS) ×6 IMPLANT
CUFF TOURNIQUET SINGLE 18IN (TOURNIQUET CUFF) ×3 IMPLANT
DECANTER SPIKE VIAL GLASS SM (MISCELLANEOUS) ×3 IMPLANT
DRSG XEROFORM 1X8 (GAUZE/BANDAGES/DRESSINGS) ×3 IMPLANT
ELECT NEEDLE TIP 2.8 STRL (NEEDLE) ×3 IMPLANT
ELECT REM PT RETURN 9FT ADLT (ELECTROSURGICAL) ×3
ELECTRODE REM PT RTRN 9FT ADLT (ELECTROSURGICAL) ×1 IMPLANT
GAUZE SPONGE 4X4 12PLY STRL (GAUZE/BANDAGES/DRESSINGS) IMPLANT
GLOVE SKINSENSE NS SZ8.0 LF (GLOVE) ×2
GLOVE SKINSENSE STRL SZ8.0 LF (GLOVE) ×1 IMPLANT
GLOVE SS N UNI LF 8.5 STRL (GLOVE) ×3 IMPLANT
GOWN STRL REUS W/ TWL LRG LVL3 (GOWN DISPOSABLE) ×1 IMPLANT
GOWN STRL REUS W/TWL LRG LVL3 (GOWN DISPOSABLE) ×2
GOWN STRL REUS W/TWL XL LVL3 (GOWN DISPOSABLE) ×3 IMPLANT
HAND ALUMI XLG (SOFTGOODS) ×3 IMPLANT
KIT ROOM TURNOVER APOR (KITS) ×3 IMPLANT
MANIFOLD NEPTUNE II (INSTRUMENTS) ×3 IMPLANT
NEEDLE HYPO 21X1.5 SAFETY (NEEDLE) ×3 IMPLANT
NS IRRIG 1000ML POUR BTL (IV SOLUTION) ×3 IMPLANT
PACK BASIC LIMB (CUSTOM PROCEDURE TRAY) ×3 IMPLANT
PAD ARMBOARD 7.5X6 YLW CONV (MISCELLANEOUS) ×3 IMPLANT
SET BASIN LINEN APH (SET/KITS/TRAYS/PACK) ×3 IMPLANT
SUT ETHILON 3 0 FSL (SUTURE) ×3 IMPLANT
SYR CONTROL 10ML LL (SYRINGE) ×3 IMPLANT

## 2015-04-02 NOTE — Anesthesia Postprocedure Evaluation (Signed)
  Anesthesia Post-op Note  Patient: Arthur Aguilar  Procedure(s) Performed: Procedure(s): RIGHT CARPAL TUNNEL RELEASE (Right)  Patient Location: PACU  Anesthesia Type:Bier block  Level of Consciousness: awake, alert , oriented and patient cooperative  Airway and Oxygen Therapy: Patient Spontanous Breathing and Patient connected to nasal cannula oxygen  Post-op Pain: none  Post-op Assessment: Post-op Vital signs reviewed, Patient's Cardiovascular Status Stable, Respiratory Function Stable, No signs of Nausea or vomiting and Pain level controlled  Post-op Vital Signs: Reviewed and stable  Last Vitals:  Filed Vitals:   04/02/15 0745  BP: 120/74  Pulse:   Temp:   Resp: 43    Complications: No apparent anesthesia complications

## 2015-04-02 NOTE — Interval H&P Note (Signed)
History and Physical Interval Note:  04/02/2015 7:24 AM  Arthur Aguilar  has presented today for surgery, with the diagnosis of RIGHT CARPAL TUNNEL SYNDROME  The various methods of treatment have been discussed with the patient and family. After consideration of risks, benefits and other options for treatment, the patient has consented to  Procedure(s): [place] as a surgical intervention .  The patient's history has been reviewed, patient examined, no change in status, stable for surgery.  I have reviewed the patient's chart and labs.  Questions were answered to the patient's satisfaction.     [name]

## 2015-04-02 NOTE — Interval H&P Note (Signed)
History and Physical Interval Note:  04/02/2015 7:23 AM  Arthur Aguilar  has presented today for surgery, with the diagnosis of RIGHT CARPAL TUNNEL SYNDROME  The various methods of treatment have been discussed with the patient and family. After consideration of risks, benefits and other options for treatment, the patient has consented to  Procedure(s): CARPAL TUNNEL RELEASE (Right) as a surgical intervention .  The patient's history has been reviewed, patient examined, no change in status, stable for surgery.  I have reviewed the patient's chart and labs.  Questions were answered to the patient's satisfaction.     [name]

## 2015-04-02 NOTE — Transfer of Care (Signed)
Immediate Anesthesia Transfer of Care Note  Patient: Arthur Aguilar  Procedure(s) Performed: Procedure(s): RIGHT CARPAL TUNNEL RELEASE (Right)  Patient Location: PACU  Anesthesia Type:Bier block  Level of Consciousness: awake, alert , oriented and patient cooperative  Airway & Oxygen Therapy: Patient Spontanous Breathing and Patient connected to nasal cannula oxygen  Post-op Assessment: Report given to RN and Post -op Vital signs reviewed and stable  Post vital signs: Reviewed and stable  Last Vitals:  Filed Vitals:   04/02/15 0745  BP: 120/74  Pulse:   Temp:   Resp: 43    Complications: No apparent anesthesia complications

## 2015-04-02 NOTE — Brief Op Note (Signed)
04/02/2015  8:32 AM  PATIENT:  Arthur Aguilar  54 y.o. male  PRE-OPERATIVE DIAGNOSIS:  RIGHT CARPAL TUNNEL SYNDROME  POST-OPERATIVE DIAGNOSIS:  RIGHT CARPAL TUNNEL SYNDROME  PROCEDURE:  Procedure(s): RIGHT CARPAL TUNNEL RELEASE (Right)  SURGEON:  Surgeon(s) and Role:    * [name], MD - Primary  PHYSICIAN ASSISTANT:   ASSISTANTS: none   ANESTHESIA:   regional  EBL:  Total I/O In: 200 [I.V.:200] Out: 0   BLOOD ADMINISTERED:none  DRAINS: none   LOCAL MEDICATIONS USED:  MARCAINE     SPECIMEN:  No Specimen  DISPOSITION OF SPECIMEN:  N/A  COUNTS:  YES  TOURNIQUET:   Total Tourniquet Time Documented: Upper Arm (Right) - 30 minutes Total: Upper Arm (Right) - 30 minutes   DICTATION: .Dragon Dictation  PLAN OF CARE: Discharge to home after PACU  PATIENT DISPOSITION:  PACU - hemodynamically stable.   Delay start of Pharmacological VTE agent (>24hrs) due to surgical blood loss or risk of bleeding: not applicable

## 2015-04-02 NOTE — Discharge Instructions (Signed)
Carpal Tunnel Release (Repair), Care After  Refer to this sheet in the next few weeks. These discharge instructions provide you with general information on caring for yourself after you leave the hospital. Your caregiver may also give you specific instructions. Your treatment has been planned according to the most current medical practices available, but unavoidable complications sometimes occur. If you have any problems or questions after discharge, please call your caregiver.  HOME CARE INSTRUCTIONS   · Have a responsible person with you for 24 hours.  · Do not drive a car or take public transportation for 24 hours.  · Only take over-the-counter or prescription medicines for pain, discomfort, or fever as directed by your caregiver. Take them as directed.  · You may put ice on the palm side of the affected wrist.  ¨ Put ice in a plastic bag.  ¨ Place a towel between your skin and the bag.  ¨ Leave the ice on for 15-20 minutes, 03-04 times per day.  · If you were given a splint to keep your wrist from bending, use it as directed. It is important to wear the splint at night or as directed. Use the splint for as long as you have pain or numbness in your hand, arm, or wrist. This may take 1 to 2 months.  · Keep your hand raised (elevated) above the level of your heart as much as possible. This keeps swelling down and helps with discomfort.  · Change bandages (dressings) as directed.  · Keep the wound clean and dry.  SEEK MEDICAL CARE IF:   · You develop pain not relieved with medicines.  · You develop numbness of your hand.  · You develop bleeding from your surgical site.  · You have an oral temperature above 102° F (38.9° C).  · You develop redness or swelling of the surgical site.  · You develop new, unexplained problems.  SEEK IMMEDIATE MEDICAL CARE IF:   · You develop a rash.  · You have difficulty breathing.  · You develop any reaction or side effects to medicines given.  MAKE SURE YOU:   · Understand these  instructions.  · Will watch your condition.  · Will get help right away if you are not doing well or get worse.  Document Released: 06/09/2005 Document Revised: 09/10/2013 Document Reviewed: 09/26/2007  ExitCare® Patient Information ©2015 [employer]. This information is not intended to replace advice given to you by your health care provider. Make sure you discuss any questions you have with your health care provider.

## 2015-04-02 NOTE — Op Note (Signed)
Date  04/02/2015  Carpal tunnel release right wrist  Preop diagnosis carpal tunnel syndrome right wrist postop diagnosis same  Procedure open carpal tunnel release right wrist  Surgeon Aline Brochure  Anesthesia regional Bier block  Operative findings compression of the right median nerveoccupied lesions  Indications failure of conservative treatment to relieve pain and paresthesias and numbness and tingling of the right hand.  The patient was identified in the preop area we confirm the surgical site marked as right wrist. Chart update completed. Patient taken to surgery. he had 2 g of Ancef. After establishing a Bier block the arm was prepped with ChloraPrep.  Timeout executed completed and confirmed site.  A straight incision was made over the carpal tunnel in line with the radial border of the ring finger. Blunt dissection was carried out to find the distal aspect of the carpal tunnel. A blunted instrument was passed beneath the carpal tunnel. Sharp incision was then used to release the transverse carpal ligament. The contents of the carpal tunnel were inspected. The median nerve was compressed with slight discoloration.  The wound was irrigated and then closed with 3-0 nylon suture. We injected 10 mL of plain Marcaine on the radial side of the incision  A sterile bandage was applied and the tourniquet was released the color of the hand and capillary refill were normal  The patient was taken to the recovery room in stable condition

## 2015-04-02 NOTE — Anesthesia Preprocedure Evaluation (Signed)
Anesthesia Evaluation  Patient identified by MRN, date of birth, ID band Patient awake    Reviewed: Allergy & Precautions, NPO status , Patient's Chart, lab work & pertinent test results  Airway Mallampati: II  TM Distance: >3 FB     Dental  (+) Teeth Intact, Missing   Pulmonary former smoker,  breath sounds clear to auscultation        Cardiovascular negative cardio ROS  Rhythm:Regular Rate:Normal     Neuro/Psych    GI/Hepatic negative GI ROS, GERD-  ,  Endo/Other    Renal/GU      Musculoskeletal  (+) Arthritis -,   Abdominal   Peds  Hematology   Anesthesia Other Findings   Reproductive/Obstetrics                             Anesthesia Physical Anesthesia Plan  ASA: II  Anesthesia Plan: Bier Block   Post-op Pain Management:    Induction: Intravenous  Airway Management Planned: Nasal Cannula  Additional Equipment:   Intra-op Plan:   Post-operative Plan:   Informed Consent: I have reviewed the patients History and Physical, chart, labs and discussed the procedure including the risks, benefits and alternatives for the proposed anesthesia with the patient or authorized representative who has indicated his/her understanding and acceptance.     Plan Discussed with:   Anesthesia Plan Comments:         Anesthesia Quick Evaluation

## 2015-04-02 NOTE — Anesthesia Procedure Notes (Addendum)
Procedure Name: MAC Date/Time: 04/02/2015 7:50 AM Performed by: Andree Elk, Kalynne Womac A Pre-anesthesia Checklist: Patient identified, Timeout performed, Emergency Drugs available, Suction available and Patient being monitored Oxygen Delivery Method: Simple face mask   Anesthesia Regional Block:  Bier block (IV Regional)  Pre-Anesthetic Checklist: ,, timeout performed, Correct Patient, Correct Site, Correct Laterality, Correct Procedure,, site marked, surgical consent,, at surgeon's request  Laterality: Right     Needles:  Injection technique: Single-shot  Needle Type: Other      Needle Gauge: 22 and 22 G    Additional Needles: Bier block (IV Regional)  Nerve Stimulator or Paresthesia:   Additional Responses:  Pulse checked post tourniquet inflation. IV NSL discontinued post injection. Narrative:  Start time: 04/02/2015 8:03 AM  Performed by: Personally

## 2015-04-05 ENCOUNTER — Encounter: Payer: Self-pay | Admitting: Orthopedic Surgery

## 2015-04-05 ENCOUNTER — Encounter (HOSPITAL_COMMUNITY): Payer: Self-pay | Admitting: Orthopedic Surgery

## 2015-04-05 ENCOUNTER — Ambulatory Visit (INDEPENDENT_AMBULATORY_CARE_PROVIDER_SITE_OTHER): Payer: Managed Care, Other (non HMO) | Admitting: Orthopedic Surgery

## 2015-04-05 VITALS — BP 124/77 | Ht 70.0 in | Wt 246.0 lb

## 2015-04-05 DIAGNOSIS — Z9889 Other specified postprocedural states: Secondary | ICD-10-CM

## 2015-04-05 NOTE — Progress Notes (Signed)
Post op appt  Arthur Aguilar is 3 days post-op: left RCTR.  Presenting for routine follow-up.    S:   Chief Complaint  Patient presents with  . Follow-up    post op 1, RT CTR, DOS 04/02/15   Doing well.  Patient has noted no excessive redness, swelling, pain and discharge.   He says some of the feeling has come back   O:  Wound healing well.   A:  Satisfactory course.    P: Sutures left in place.  .  Patient to return in 10 days.   Patient is to return as needed for redness, swelling, discomfort, or any concern about his  surgery.

## 2015-04-15 ENCOUNTER — Ambulatory Visit (INDEPENDENT_AMBULATORY_CARE_PROVIDER_SITE_OTHER): Payer: Managed Care, Other (non HMO) | Admitting: Orthopedic Surgery

## 2015-04-15 VITALS — BP 123/78 | Ht 70.0 in | Wt 246.0 lb

## 2015-04-15 DIAGNOSIS — Z9889 Other specified postprocedural states: Secondary | ICD-10-CM

## 2015-04-15 NOTE — Progress Notes (Signed)
Sutures removed, patient tolerated well 

## 2015-05-13 ENCOUNTER — Ambulatory Visit (INDEPENDENT_AMBULATORY_CARE_PROVIDER_SITE_OTHER): Payer: Managed Care, Other (non HMO) | Admitting: Orthopedic Surgery

## 2015-05-13 VITALS — BP 138/84 | Ht 70.0 in | Wt 246.0 lb

## 2015-05-13 DIAGNOSIS — Z4789 Encounter for other orthopedic aftercare: Secondary | ICD-10-CM

## 2015-05-13 DIAGNOSIS — Z9889 Other specified postprocedural states: Secondary | ICD-10-CM

## 2015-05-13 NOTE — Progress Notes (Signed)
Patient ID: Arthur Aguilar, male   DOB: 24-Jun-1961, 54 y.o.   MRN: [id number] 6 weeks postop right carpal tunnel release. The patient says that he has improved grip improved overall pain just some mild sensory loss still at the tips of the thumb index and long finger.  His wound is clean healed no tenderness  He has full range of motion and normal grip  Released after carpal tunnel surgery

## 2015-09-20 ENCOUNTER — Telehealth: Payer: Self-pay | Admitting: Orthopedic Surgery
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
## 2015-09-20 NOTE — Telephone Encounter (Signed)
PERFECT  WE ARE NOT AN er   Needs to go to er

## 2015-09-20 NOTE — Telephone Encounter (Signed)
Patient aware.

## 2015-09-20 NOTE — Telephone Encounter (Signed)
Patient has called back and requested to be seen today asap, I explained to him Dr. Althia Forts only here a half day today and that he should be seen in the ER for Evaluation and Xrays and if the ER recommends Ortho follow up we could schedule him in possibly as soon as tomorrow. Patient was not happy with that recommendation because he did not want to sit in the ER all day today because he stated he had work to do but I explained that our office has to follow protocol.

## 2015-12-23 ENCOUNTER — Other Ambulatory Visit: Payer: Self-pay | Admitting: *Deleted

## 2015-12-23 ENCOUNTER — Ambulatory Visit: Payer: Managed Care, Other (non HMO)

## 2015-12-23 ENCOUNTER — Other Ambulatory Visit: Payer: Self-pay | Admitting: Radiology

## 2015-12-23 ENCOUNTER — Encounter: Payer: Managed Care, Other (non HMO) | Admitting: Orthopedic Surgery

## 2015-12-23 ENCOUNTER — Other Ambulatory Visit: Payer: Self-pay

## 2015-12-23 VITALS — Ht 70.0 in | Wt 245.0 lb

## 2015-12-23 DIAGNOSIS — M1610 Unilateral primary osteoarthritis, unspecified hip: Secondary | ICD-10-CM

## 2015-12-23 DIAGNOSIS — Z96642 Presence of left artificial hip joint: Secondary | ICD-10-CM

## 2016-01-25 NOTE — Progress Notes (Signed)
Patient was unable to stay to complete visit on this date of service, 12/23/15; non-working equipment

## 2017-04-03 ENCOUNTER — Ambulatory Visit (INDEPENDENT_AMBULATORY_CARE_PROVIDER_SITE_OTHER): Payer: Managed Care, Other (non HMO) | Admitting: Orthopedic Surgery

## 2017-04-03 ENCOUNTER — Encounter: Payer: Self-pay | Admitting: Orthopedic Surgery

## 2017-04-03 ENCOUNTER — Ambulatory Visit (INDEPENDENT_AMBULATORY_CARE_PROVIDER_SITE_OTHER): Payer: Managed Care, Other (non HMO)

## 2017-04-03 VITALS — BP 125/88 | HR 99 | Ht 71.5 in | Wt 244.0 lb

## 2017-04-03 DIAGNOSIS — M75102 Unspecified rotator cuff tear or rupture of left shoulder, not specified as traumatic: Secondary | ICD-10-CM | POA: Diagnosis not present

## 2017-04-03 DIAGNOSIS — M25512 Pain in left shoulder: Secondary | ICD-10-CM

## 2017-04-03 MED ORDER — TRAMADOL-ACETAMINOPHEN 37.5-325 MG PO TABS: 1.0000 | ORAL_TABLET | ORAL | 1 refills | Status: DC | PRN

## 2017-04-03 NOTE — Progress Notes (Signed)
New Patient History and Physical   Patient ID: Arthur Aguilar, male   DOB: [birth year], 56 y.o.   MRN: [id number]  Chief Complaint  Patient presents with  . Shoulder Injury    Left shoulder pain, injured around 2 months ago, fell in snow.    HPI LAYMAN GULLY is a 56 y.o. male.  He presents with pain in his left shoulder for the last month a half or so. During one of the snow falls he fell and landed with his left arm above his head acute pain. After the swelling went down he noticed that he couldn't move his shoulder as well anymore could lifted over his head. He tried oxycodone hydrocodone and 800 mg ibuprofen no relief. He has global diffuse dull aching pain which is worse at night  Review of Systems Review of Systems  Musculoskeletal: Positive for arthralgias.  Neurological: Positive for weakness. Negative for numbness.    Past Medical History:  Diagnosis Date  . Arthritis   . Scoliosis     Past Surgical History:  Procedure Laterality Date  . CARPAL TUNNEL RELEASE Right 04/02/2015   Procedure: RIGHT CARPAL TUNNEL RELEASE;  Surgeon: [name], MD;  Location: [facility];  Service: Orthopedics;  Laterality: Right;  . dental bone implant     bottom - right jaw.  Marland Kitchen HIP SURGERY    . left hip replacement      Family History  Problem Relation Age of Onset  . Heart disease    . Arthritis    . Lung disease    . Diabetes      Social History Social History  Substance Use Topics  . Smoking status: Former Smoker    Packs/day: 0.50    Years: 25.00    Types: Cigarettes    Quit date: 03/29/2009  . Smokeless tobacco: Not on file  . Alcohol use Yes     Comment: rarely    No Known Allergies  Current Outpatient Prescriptions  Medication Sig Dispense Refill  . fenofibrate 160 MG tablet Take 160 mg by mouth daily.     . Fish Oil-Cholecalciferol (FISH OIL + D3 PO) Take 1 capsule by mouth daily.    . Flaxseed, Linseed, (FLAX SEED OIL PO) Take 1 capsule by mouth daily.    Marland Kitchen  glucosamine-chondroitin 500-400 MG tablet Take 1 tablet by mouth 3 (three) times daily.    Marland Kitchen HYDROcodone-acetaminophen (NORCO/VICODIN) 5-325 MG per tablet Take 1-2 tablets by mouth every 4 (four) hours as needed for moderate pain. 40 tablet 0  . ibuprofen (ADVIL,MOTRIN) 800 MG tablet Take 1 tablet (800 mg total) by mouth every 8 (eight) hours as needed. (Patient not taking: Reported on 05/13/2015) 60 tablet 1  . Multiple Vitamin (MULTIVITAMIN WITH MINERALS) TABS tablet Take 1 tablet by mouth daily.     No current facility-administered medications for this visit.        Physical Exam BP 125/88   Pulse 99   Ht 5' 11.5" (1.816 m)   Wt 244 lb (110.7 kg)   BMI 33.56 kg/m  Physical Exam  Constitutional: He is oriented to person, place, and time. He appears well-developed and well-nourished. No distress.  Cardiovascular: Normal rate and intact distal pulses.   Neurological: He is alert and oriented to person, place, and time.  Skin: Skin is warm and dry. No rash noted. He is not diaphoretic. No erythema. No pallor.  Psychiatric: He has a normal mood and affect. His behavior is normal. Judgment  and thought content normal.   Ambulatory status normal with no assistive devices Right Shoulder Exam   Tenderness  The patient is experiencing no tenderness.    Range of Motion  Active Abduction: normal  Passive Abduction: normal  Extension: normal  Forward Flexion:  140 abnormal  External Rotation: normal  Internal Rotation 0 degrees: normal  Internal Rotation 90 degrees: normal   Muscle Strength  The patient has normal right shoulder strength.  Tests  Apprehension: negative Impingement: negative  Other  Erythema: absent Sensation: normal Pulse: present   Left Shoulder Exam   Tenderness  The patient is experiencing no tenderness (DELTOID, ROTATOR INTERVAL, POSTERIOR SUBACROMIAL ).     Range of Motion  Active Abduction: 90  Passive Abduction: normal  Extension: normal   Forward Flexion: 120  External Rotation: normal  Internal Rotation 0 degrees: L5  Internal Rotation 90 degrees: abnormal   Muscle Strength  The patient has normal left shoulder strength. Abduction: 4/5  Internal Rotation: 5/5  External Rotation: 5/5  Supraspinatus: 4/5  Subscapularis: 5/5  Biceps: 5/5   Tests  Apprehension: negative  Other  Erythema: absent Sensation: normal Pulse: present        Data Reviewed Imaging of the LEFT shoulder are independently reviewed and I interpreted these as   Radiology reading dictated by Dr. Shanon Brow report of the LEFT  shoulder  Chief complaint shoulder pain  Views obtained via modified scapular Y-view  Findings: The [facility] and lateral x-rays of the shoulder show no abnormalities. The glenohumeral joint is normal. Shenton's line of the shoulder is normal. The acromion is a type 1 No fracture is seen. There are no bone tumor seen. The visual portions of the chest wall are normal in the residual portion of the humeral shaft is normal. Hemispherical glenoid is normal in shape and contour.  THE AC JOINT IS HYPERTROPHIC    Impression normal x-ray of the shoulder  Assessment  Encounter Diagnoses  Name Primary?  . Pain in joint of left shoulder   . Tear of left rotator cuff, unspecified tear extent Yes      Plan  MRI  Meds ordered this encounter  Medications  . traMADol-acetaminophen (ULTRACET) 37.5-325 MG tablet    Sig: Take 1 tablet by mouth every 4 (four) hours as needed.    Dispense:  60 tablet    Refill:  1

## 2017-04-09 ENCOUNTER — Telehealth: Payer: Self-pay | Admitting: Orthopedic Surgery

## 2017-04-09 NOTE — Telephone Encounter (Signed)
Patient was calling to check on MRI appointment>  Please advise if approved or scheduled   Thanks

## 2017-04-10 NOTE — Telephone Encounter (Signed)
Spoke to [facility] and they will be calling patient to schedule

## 2017-04-16 ENCOUNTER — Other Ambulatory Visit: Payer: Self-pay | Admitting: Orthopedic Surgery

## 2017-04-16 DIAGNOSIS — M75102 Unspecified rotator cuff tear or rupture of left shoulder, not specified as traumatic: Secondary | ICD-10-CM

## 2017-04-16 DIAGNOSIS — Z77018 Contact with and (suspected) exposure to other hazardous metals: Secondary | ICD-10-CM

## 2017-04-26 ENCOUNTER — Ambulatory Visit
Admission: RE | Admit: 2017-04-26 | Discharge: 2017-04-26 | Disposition: A | Discharge status: Home / Self Care | Payer: Managed Care, Other (non HMO) | Source: Ambulatory Visit | Attending: Orthopedic Surgery | Admitting: Orthopedic Surgery

## 2017-04-26 DIAGNOSIS — M75102 Unspecified rotator cuff tear or rupture of left shoulder, not specified as traumatic: Secondary | ICD-10-CM

## 2017-04-26 DIAGNOSIS — Z77018 Contact with and (suspected) exposure to other hazardous metals: Secondary | ICD-10-CM

## 2017-05-11 ENCOUNTER — Ambulatory Visit (INDEPENDENT_AMBULATORY_CARE_PROVIDER_SITE_OTHER): Payer: Managed Care, Other (non HMO) | Admitting: Orthopedic Surgery

## 2017-05-11 ENCOUNTER — Encounter: Payer: Self-pay | Admitting: Orthopedic Surgery

## 2017-05-11 DIAGNOSIS — S46812D Strain of other muscles, fascia and tendons at shoulder and upper arm level, left arm, subsequent encounter: Secondary | ICD-10-CM | POA: Diagnosis not present

## 2017-05-11 DIAGNOSIS — S46809D Unspecified injury of other muscles, fascia and tendons at shoulder and upper arm level, unspecified arm, subsequent encounter: Secondary | ICD-10-CM | POA: Diagnosis not present

## 2017-05-11 DIAGNOSIS — IMO0001 Reserved for inherently not codable concepts without codable children: Secondary | ICD-10-CM

## 2017-05-11 NOTE — Patient Instructions (Signed)
You have decided to proceed with rotator cuff repair surgery. You have decided not to continue with nonoperative measures such as but not limited to oral medication,   activity modification, physical therapy, or injection.  We will perform a rotator cuff repair. Some of the risks associated with rotator cuff repair include but are not limited to Bleeding Infection Swelling Stiffness Blood clot Pain Re-tearing of the rotator cuff Failure of the rotator cuff to heal   If you're not comfortable with these risks and would like to continue with nonoperative treatment please let Dr. Harrison know prior to your surgery.  

## 2017-05-11 NOTE — Progress Notes (Signed)
Patient ID: Arthur Aguilar, male   DOB: 03-11-1961, 56 y.o.   MRN: [id number]  Chief Complaint  Patient presents with  . Follow-up    left shoulder MRI review    Arthur Aguilar is a 56 y.o. male.  He presents with pain in his left shoulder for the last month a half or so. During one of the snow falls he fell and landed with his left arm above his head acute pain. After the swelling went down he noticed that he couldn't move his shoulder as well anymore could lifted over his head. He tried oxycodone hydrocodone and 800 mg ibuprofen no relief. He has global diffuse dull aching pain which is worse at night    Review of Systems  Musculoskeletal: Positive for joint pain.   Past Medical History:  Diagnosis Date  . Arthritis   . Scoliosis    Past Surgical History:  Procedure Laterality Date  . CARPAL TUNNEL RELEASE Right 04/02/2015   Procedure: RIGHT CARPAL TUNNEL RELEASE;  Surgeon: [name], MD;  Location: [facility];  Service: Orthopedics;  Laterality: Right;  . dental bone implant     bottom - right jaw.  Marland Kitchen HIP SURGERY    . left hip replacement     Family History  Problem Relation Age of Onset  . Heart disease Unknown   . Arthritis Unknown   . Lung disease Unknown   . Diabetes Unknown    Social History  Substance Use Topics  . Smoking status: Former Smoker    Packs/day: 0.50    Years: 25.00    Types: Cigarettes    Quit date: 03/29/2009  . Smokeless tobacco: Never Used  . Alcohol use Yes     Comment: rarely      There were no vitals taken for this visit. Gen. appearance is normal grooming and hygiene normal Orientation to person place and time normal Mood normal No ambulatory disturbances are noted at this time  His right arm is some nontender full range of motion strength and stability normal with some discomfort with overhead activity motor exam normal pulses good sensation normal lymph nodes negative skin normal  Lower extremities show no clubbing cyanosis or  edema joint restrictions he does have a total hip pain placed    Ortho Exam Patient has tenderness in the shoulder but none anteriorly over the subscap in his belly press test was normal his main pain is when reaching out and reaching overhead  I think the supraspinatus tendon repairs all that is necessary. There is some data that shows that undiagnosed unrepaired subscapularis tears can lead to failed rotator cuff tears however he is completely asymptomatic physically exam wise and clinically in terms of the subscap  The MRI indicates full-thickness tearing of the subscap with some fibers intact  I do see significant undersurface tearing of the subscap but not full thickness tear through the tendon completely  So we will go with an open left rotator cuff repair doing anterior approach and evaluate the subscap to see if anything needs to be done to   A/P  Medical decision-making  Acromioclavicular Joint: Slight AC joint arthropathy. The distal clavicle indents the supraspinous muscle. Type 2 acromion. FINDINGS: Rotator cuff: There is a 10 x 16 mm full-thickness tear of distal supraspinous tendon. The adjacent portions of the tendon are severely degenerated. There is also a full-thickness tear of the subscapularis tendon although there are some intact fibers remaining. IMPRESSION: 1. Full-thickness tear of the  distal supraspinous tendon with severe degeneration of the tendon around the tear. 2. Full-thickness tear of the subscapularis tendon with some intact remaining fibers. 3. Inferior labral tear.     Electronically Signed   By: Lorriane Shire M.D.   On: 04/27/2017 08:46  Encounter Diagnoses  Name Primary?  . Traumatic injury of subscapularis tendon, unspecified laterality, subsequent encounter Yes  . Tear of left supraspinatus tendon, subsequent encounter       [name], MD 05/11/2017 8:39 AM

## 2017-05-14 ENCOUNTER — Other Ambulatory Visit: Payer: Self-pay | Admitting: *Deleted

## 2017-05-14 ENCOUNTER — Telehealth: Payer: Self-pay | Admitting: Orthopedic Surgery

## 2017-05-14 NOTE — Telephone Encounter (Addendum)
Patient left message to say that the shoulder surgery scheduled for 6/20 is good. He called back just now to say if it could be scheduled for 6/27 that would be better, because of his wife's situation.  Please call and advise.  [phone]

## 2017-05-14 NOTE — Telephone Encounter (Signed)
Patient aware, per my speaking with nurse that the date 05/30/17 is okay for surgery; aware he will receive a call back with further information - ph# [phone]

## 2017-05-14 NOTE — Telephone Encounter (Signed)
SCHEDULED

## 2017-05-18 ENCOUNTER — Encounter: Payer: Self-pay | Admitting: *Deleted

## 2017-05-23 NOTE — Patient Instructions (Signed)
    Arthur Aguilar  05/23/2017     @PREFPERIOPPHARMACY @   Your procedure is scheduled on 05/30/2017.  Report to [facility] at 9:00 A.M.  Call this number if you have problems the morning of surgery:  [phone]   Remember:  Do not eat food or drink liquids after midnight.  Take these medicines the morning of surgery with A SIP OF WATER Hydrocodone OR Ultracet if needed   Do not wear jewelry, make-up or nail polish.  Do not wear lotions, powders, or perfumes, or deoderant.  Do not shave 48 hours prior to surgery.  Men may shave face and neck.  Do not bring valuables to the hospital.  [facility] is not responsible for any belongings or valuables.  Contacts, dentures or bridgework may not be worn into surgery.  Leave your suitcase in the car.  After surgery it may be brought to your room.  For patients admitted to the hospital, discharge time will be determined by your treatment team.  Patients discharged the day of surgery will not be allowed to drive home.    Please read over the following fact sheets that you were given. Surgical Site Infection Prevention and Anesthesia Post-op Instructions     PATIENT INSTRUCTIONS POST-ANESTHESIA  IMMEDIATELY FOLLOWING SURGERY:  Do not drive or operate machinery for the first twenty four hours after surgery.  Do not make any important decisions for twenty four hours after surgery or while taking narcotic pain medications or sedatives.  If you develop intractable nausea and vomiting or a severe headache please notify your doctor immediately.  FOLLOW-UP:  Please make an appointment with your surgeon as instructed. You do not need to follow up with anesthesia unless specifically instructed to do so.  WOUND CARE INSTRUCTIONS (if applicable):  Keep a dry clean dressing on the anesthesia/puncture wound site if there is drainage.  Once the wound has quit draining you may leave it open to air.  Generally you should leave the bandage intact for  twenty four hours unless there is drainage.  If the epidural site drains for more than 36-48 hours please call the anesthesia department.  QUESTIONS?:  Please feel free to call your physician or the hospital operator if you have any questions, and they will be happy to assist you.

## 2017-05-25 ENCOUNTER — Other Ambulatory Visit: Payer: Self-pay

## 2017-05-25 ENCOUNTER — Encounter (HOSPITAL_COMMUNITY)
Admission: RE | Admit: 2017-05-25 | Discharge: 2017-05-25 | Disposition: A | Discharge status: Home / Self Care | Payer: Managed Care, Other (non HMO) | Source: Ambulatory Visit | Attending: Orthopedic Surgery | Admitting: Orthopedic Surgery

## 2017-05-25 ENCOUNTER — Encounter (HOSPITAL_COMMUNITY): Payer: Self-pay

## 2017-05-25 DIAGNOSIS — M75102 Unspecified rotator cuff tear or rupture of left shoulder, not specified as traumatic: Secondary | ICD-10-CM | POA: Diagnosis not present

## 2017-05-25 DIAGNOSIS — Z01812 Encounter for preprocedural laboratory examination: Secondary | ICD-10-CM | POA: Insufficient documentation

## 2017-05-25 HISTORY — DX: Personal history of urinary calculi: Z87.442

## 2017-05-25 HISTORY — DX: Pure hypercholesterolemia, unspecified: E78.00

## 2017-05-25 HISTORY — DX: Gastro-esophageal reflux disease without esophagitis: K21.9

## 2017-05-25 LAB — BASIC METABOLIC PANEL
ANION GAP: 12 (ref 5–15)
BUN: 11 mg/dL (ref 6–20)
CALCIUM: 9.2 mg/dL (ref 8.9–10.3)
CO2: 22 mmol/L (ref 22–32)
Chloride: 102 mmol/L (ref 101–111)
Creatinine, Ser: 0.82 mg/dL (ref 0.61–1.24)
Glucose, Bld: 141 mg/dL — ABNORMAL HIGH (ref 65–99)
POTASSIUM: 3.9 mmol/L (ref 3.5–5.1)
Sodium: 136 mmol/L (ref 135–145)

## 2017-05-25 LAB — CBC
HEMATOCRIT: 44.6 % (ref 39.0–52.0)
Hemoglobin: 15.3 g/dL (ref 13.0–17.0)
MCH: 29.4 pg (ref 26.0–34.0)
MCHC: 34.3 g/dL (ref 30.0–36.0)
MCV: 85.6 fL (ref 78.0–100.0)
Platelets: 293 10*3/uL (ref 150–400)
RBC: 5.21 MIL/uL (ref 4.22–5.81)
RDW: 13 % (ref 11.5–15.5)
WBC: 8.6 10*3/uL (ref 4.0–10.5)

## 2017-05-29 NOTE — H&P (Signed)
Arthur Aguilar is an 56 y.o. male.    Chief Complaint  Patient presents with  . Shoulder Injury      Left shoulder pain, injured around 2 months ago, fell in snow.      HPI Arthur Aguilar is a 56 y.o. male.  He presents with pain in his left shoulder for the last month a half or so. During one of the snow falls he fell and landed with his left arm above his head acute pain. After the swelling went down he noticed that he couldn't move his shoulder as well anymore could lifted over his head. He tried oxycodone hydrocodone and 800 mg ibuprofen no relief. He has global diffuse dull aching pain which is worse at night   Review of Systems Review of Systems  Musculoskeletal: Positive for arthralgias.  Neurological: Positive for weakness. Negative for numbness.          Past Medical History:  Diagnosis Date  . Arthritis    . Scoliosis             Past Surgical History:  Procedure Laterality Date  . CARPAL TUNNEL RELEASE Right 04/02/2015    Procedure: RIGHT CARPAL TUNNEL RELEASE;  Surgeon: [name], MD;  Location: [facility];  Service: Orthopedics;  Laterality: Right;  . dental bone implant        bottom - right jaw.  Marland Kitchen HIP SURGERY      . left hip replacement          Family History  Problem Relation Age of Onset  . Heart disease      . Arthritis      . Lung disease      . Diabetes          Social History        Social History  Substance Use Topics  . Smoking status: Former Smoker      Packs/day: 0.50      Years: 25.00      Types: Cigarettes      Quit date: 03/29/2009  . Smokeless tobacco: Not on file  . Alcohol use Yes         Comment: rarely      No Known Allergies         Current Outpatient Prescriptions  Medication Sig Dispense Refill  . fenofibrate 160 MG tablet Take 160 mg by mouth daily.       . Fish Oil-Cholecalciferol (FISH OIL + D3 PO) Take 1 capsule by mouth daily.      . Flaxseed, Linseed, (FLAX SEED OIL PO) Take 1 capsule by mouth daily.       Marland Kitchen glucosamine-chondroitin 500-400 MG tablet Take 1 tablet by mouth 3 (three) times daily.      Marland Kitchen HYDROcodone-acetaminophen (NORCO/VICODIN) 5-325 MG per tablet Take 1-2 tablets by mouth every 4 (four) hours as needed for moderate pain. 40 tablet 0  . ibuprofen (ADVIL,MOTRIN) 800 MG tablet Take 1 tablet (800 mg total) by mouth every 8 (eight) hours as needed. (Patient not taking: Reported on 05/13/2015) 60 tablet 1  . Multiple Vitamin (MULTIVITAMIN WITH MINERALS) TABS tablet Take 1 tablet by mouth daily.        No current facility-administered medications for this visit.             Physical Exam BP 125/88   Pulse 99   Ht 5' 11.5" (1.816 m)   Wt 244 lb (110.7 kg)   BMI 33.56 kg/m  Physical  Exam  Constitutional: He is oriented to person, place, and time. He appears well-developed and well-nourished. No distress.  Cardiovascular: Normal rate and intact distal pulses.   Neurological: He is alert and oriented to person, place, and time.  Skin: Skin is warm and dry. No rash noted. He is not diaphoretic. No erythema. No pallor.  Psychiatric: He has a normal mood and affect. His behavior is normal. Judgment and thought content normal.    Ambulatory status normal with no assistive devices Right Shoulder Exam    Tenderness  The patient is experiencing no tenderness.       Range of Motion  Active Abduction: normal  Passive Abduction: normal  Extension: normal  Forward Flexion:  140 abnormal  External Rotation: normal  Internal Rotation 0 degrees: normal  Internal Rotation 90 degrees: normal    Muscle Strength  The patient has normal right shoulder strength.   Tests  Apprehension: negative Impingement: negative   Other  Erythema: absent Sensation: normal Pulse: present     Left Shoulder Exam    Tenderness  The patient is experiencing no tenderness (DELTOID, ROTATOR INTERVAL, POSTERIOR SUBACROMIAL ).        Range of Motion  Active Abduction: 90  Passive Abduction:  normal  Extension: normal  Forward Flexion: 120  External Rotation: normal  Internal Rotation 0 degrees: L5  Internal Rotation 90 degrees: abnormal    Muscle Strength  The patient has normal left shoulder strength. Abduction: 4/5  Internal Rotation: 5/5  External Rotation: 5/5  Supraspinatus: 4/5  Subscapularis: 5/5  Biceps: 5/5    Tests  Apprehension: negative   Other  Erythema: absent Sensation: normal Pulse: present            Data Reviewed Imaging of the LEFT shoulder are independently reviewed and I interpreted these as    Radiology reading dictated by Dr. Shanon Brow report of the LEFT  shoulder   Chief complaint shoulder pain   Views obtained via modified scapular Y-view   Findings: The [facility] and lateral x-rays of the shoulder show no abnormalities. The glenohumeral joint is normal. Shenton's line of the shoulder is normal. The acromion is a type 1 No fracture is seen. There are no bone tumor seen. The visual portions of the chest wall are normal in the residual portion of the humeral shaft is normal. Hemispherical glenoid is normal in shape and contour.   THE AC JOINT IS HYPERTROPHIC      Impression normal x-ray of the shoulder   Assessment IMPRESSION: 1. Full-thickness tear of the distal supraspinous tendon with severe degeneration of the tendon around the tear. 2. Full-thickness tear of the subscapularis tendon with some intact remaining fibers. 3. Inferior labral tear.     Electronically Signed   By: Lorriane Shire M.D.   On: 04/27/2017 08:46   Allergies: No Known Allergies   Diagnosis torn rotator cuff left shoulder  Clinically the patient has no evidence of subscapularis tearing  Lantus do an open left rotator cuff repair of the left shoulder    ROS See above dictation  Physical Exam  See above dictation  Arthur Abbott, MD 05/29/2017, 5:01 PM

## 2017-05-30 ENCOUNTER — Encounter (HOSPITAL_COMMUNITY): Payer: Self-pay

## 2017-05-30 ENCOUNTER — Encounter (HOSPITAL_COMMUNITY)
Admission: RE | Disposition: A | Discharge status: Home / Self Care | Payer: Self-pay | Source: Ambulatory Visit | Attending: Orthopedic Surgery

## 2017-05-30 ENCOUNTER — Ambulatory Visit (HOSPITAL_COMMUNITY)
Admission: RE | Admit: 2017-05-30 | Discharge: 2017-05-30 | Disposition: A | Discharge status: Home / Self Care | Payer: Managed Care, Other (non HMO) | Source: Ambulatory Visit | Attending: Orthopedic Surgery | Admitting: Orthopedic Surgery

## 2017-05-30 ENCOUNTER — Ambulatory Visit (HOSPITAL_COMMUNITY): Payer: Managed Care, Other (non HMO) | Admitting: Anesthesiology

## 2017-05-30 DIAGNOSIS — W000XXA Fall on same level due to ice and snow, initial encounter: Secondary | ICD-10-CM | POA: Diagnosis not present

## 2017-05-30 DIAGNOSIS — S46012A Strain of muscle(s) and tendon(s) of the rotator cuff of left shoulder, initial encounter: Secondary | ICD-10-CM | POA: Insufficient documentation

## 2017-05-30 DIAGNOSIS — Z79899 Other long term (current) drug therapy: Secondary | ICD-10-CM | POA: Insufficient documentation

## 2017-05-30 DIAGNOSIS — Z96642 Presence of left artificial hip joint: Secondary | ICD-10-CM | POA: Insufficient documentation

## 2017-05-30 DIAGNOSIS — K219 Gastro-esophageal reflux disease without esophagitis: Secondary | ICD-10-CM | POA: Insufficient documentation

## 2017-05-30 DIAGNOSIS — Z87891 Personal history of nicotine dependence: Secondary | ICD-10-CM | POA: Diagnosis not present

## 2017-05-30 DIAGNOSIS — M75122 Complete rotator cuff tear or rupture of left shoulder, not specified as traumatic: Secondary | ICD-10-CM | POA: Diagnosis not present

## 2017-05-30 HISTORY — PX: SHOULDER OPEN ROTATOR CUFF REPAIR: SHX2407

## 2017-05-30 SURGERY — REPAIR, ROTATOR CUFF, OPEN
Anesthesia: General | Site: Shoulder | Laterality: Left

## 2017-05-30 MED ORDER — ACETAMINOPHEN 500 MG PO TABS
500.0000 mg | ORAL_TABLET | Freq: Once | ORAL | Status: AC
  Administered 2017-05-30: 500 mg via ORAL

## 2017-05-30 MED ORDER — OXYCODONE HCL 5 MG PO TABS
ORAL_TABLET | ORAL | Status: AC
  Filled 2017-05-30: qty 1

## 2017-05-30 MED ORDER — BUPIVACAINE LIPOSOME 1.3 % IJ SUSP
INTRAMUSCULAR | Status: DC | PRN
  Administered 2017-05-30: 20 mL

## 2017-05-30 MED ORDER — METHOCARBAMOL 750 MG PO TABS: 750.0000 mg | ORAL_TABLET | Freq: Four times a day (QID) | ORAL | 1 refills | Status: DC

## 2017-05-30 MED ORDER — LIDOCAINE 2% (20 MG/ML) 5 ML SYRINGE
INTRAMUSCULAR | Status: DC | PRN
  Administered 2017-05-30: 40 mg via INTRAVENOUS

## 2017-05-30 MED ORDER — HYDROMORPHONE HCL 1 MG/ML IJ SOLN
INTRAMUSCULAR | Status: AC
  Filled 2017-05-30: qty 1

## 2017-05-30 MED ORDER — LIDOCAINE HCL (PF) 1 % IJ SOLN
INTRAMUSCULAR | Status: AC
  Filled 2017-05-30: qty 5

## 2017-05-30 MED ORDER — HYDROCODONE-ACETAMINOPHEN 10-325 MG PO TABS: 1.0000 | ORAL_TABLET | ORAL | 0 refills | Status: DC | PRN

## 2017-05-30 MED ORDER — OXYCODONE HCL 5 MG/5ML PO SOLN
5.0000 mg | Freq: Once | ORAL | Status: DC | PRN
Start: 2017-05-30 — End: 2017-05-30

## 2017-05-30 MED ORDER — DEXAMETHASONE SODIUM PHOSPHATE 10 MG/ML IJ SOLN
INTRAMUSCULAR | Status: DC | PRN
  Administered 2017-05-30: 8 mg via INTRAVENOUS

## 2017-05-30 MED ORDER — FENTANYL CITRATE (PF) 250 MCG/5ML IJ SOLN
INTRAMUSCULAR | Status: AC
  Filled 2017-05-30: qty 5

## 2017-05-30 MED ORDER — LACTATED RINGERS IV SOLN
INTRAVENOUS | Status: DC
  Administered 2017-05-30: 10:00:00 via INTRAVENOUS

## 2017-05-30 MED ORDER — KETOROLAC TROMETHAMINE 30 MG/ML IJ SOLN
30.0000 mg | Freq: Once | INTRAMUSCULAR | Status: AC
  Administered 2017-05-30: 30 mg via INTRAVENOUS
  Filled 2017-05-30: qty 1

## 2017-05-30 MED ORDER — BUPIVACAINE-EPINEPHRINE (PF) 0.5% -1:200000 IJ SOLN
INTRAMUSCULAR | Status: DC | PRN
  Administered 2017-05-30: 42 mL via PERINEURAL

## 2017-05-30 MED ORDER — ONDANSETRON HCL 4 MG/2ML IJ SOLN
INTRAMUSCULAR | Status: AC
  Filled 2017-05-30: qty 2

## 2017-05-30 MED ORDER — 0.9 % SODIUM CHLORIDE (POUR BTL) OPTIME
TOPICAL | Status: DC | PRN
  Administered 2017-05-30: 1000 mL

## 2017-05-30 MED ORDER — METHOCARBAMOL 1000 MG/10ML IJ SOLN
500.0000 mg | Freq: Once | INTRAVENOUS | Status: AC
  Administered 2017-05-30: 500 mg via INTRAVENOUS
  Filled 2017-05-30: qty 5

## 2017-05-30 MED ORDER — OXYCODONE HCL 5 MG PO TABS
5.0000 mg | ORAL_TABLET | Freq: Once | ORAL | Status: AC
  Administered 2017-05-30: 5 mg via ORAL

## 2017-05-30 MED ORDER — BUPIVACAINE-EPINEPHRINE (PF) 0.5% -1:200000 IJ SOLN
INTRAMUSCULAR | Status: AC
  Filled 2017-05-30: qty 60

## 2017-05-30 MED ORDER — LACTATED RINGERS IV SOLN: INTRAVENOUS | Status: DC | PRN

## 2017-05-30 MED ORDER — ONDANSETRON HCL 4 MG/2ML IJ SOLN
4.0000 mg | Freq: Once | INTRAMUSCULAR | Status: AC
  Administered 2017-05-30: 4 mg via INTRAVENOUS

## 2017-05-30 MED ORDER — MIDAZOLAM HCL 2 MG/2ML IJ SOLN
1.0000 mg | Freq: Once | INTRAMUSCULAR | Status: AC | PRN
  Administered 2017-05-30: 2 mg via INTRAVENOUS

## 2017-05-30 MED ORDER — IBUPROFEN 800 MG PO TABS: 800.0000 mg | ORAL_TABLET | Freq: Three times a day (TID) | ORAL | 1 refills | Status: DC | PRN

## 2017-05-30 MED ORDER — CHLORHEXIDINE GLUCONATE 4 % EX LIQD: 60.0000 mL | Freq: Once | CUTANEOUS | Status: DC

## 2017-05-30 MED ORDER — IBUPROFEN 800 MG PO TABS
800.0000 mg | ORAL_TABLET | Freq: Once | ORAL | Status: AC
  Administered 2017-05-30: 800 mg via ORAL
  Filled 2017-05-30: qty 1

## 2017-05-30 MED ORDER — ACETAMINOPHEN 325 MG PO TABS
650.0000 mg | ORAL_TABLET | Freq: Once | ORAL | Status: AC
  Administered 2017-05-30: 650 mg via ORAL

## 2017-05-30 MED ORDER — BUPIVACAINE LIPOSOME 1.3 % IJ SUSP
INTRAMUSCULAR | Status: AC
  Filled 2017-05-30: qty 20

## 2017-05-30 MED ORDER — ACETAMINOPHEN 325 MG PO TABS
ORAL_TABLET | ORAL | Status: AC
  Filled 2017-05-30: qty 2

## 2017-05-30 MED ORDER — PROPOFOL 10 MG/ML IV BOLUS
INTRAVENOUS | Status: DC | PRN
  Administered 2017-05-30: 200 mg via INTRAVENOUS

## 2017-05-30 MED ORDER — ACETAMINOPHEN 500 MG PO TABS
ORAL_TABLET | ORAL | Status: AC
  Filled 2017-05-30: qty 1

## 2017-05-30 MED ORDER — OXYCODONE HCL 5 MG PO TABS: 5.0000 mg | ORAL_TABLET | Freq: Once | ORAL | Status: DC | PRN

## 2017-05-30 MED ORDER — FENTANYL CITRATE (PF) 100 MCG/2ML IJ SOLN
INTRAMUSCULAR | Status: DC | PRN
  Administered 2017-05-30: 25 ug via INTRAVENOUS
  Administered 2017-05-30: 50 ug via INTRAVENOUS
  Administered 2017-05-30: 25 ug via INTRAVENOUS
  Administered 2017-05-30: 50 ug via INTRAVENOUS
  Administered 2017-05-30: 25 ug via INTRAVENOUS
  Administered 2017-05-30: 50 ug via INTRAVENOUS
  Administered 2017-05-30: 25 ug via INTRAVENOUS

## 2017-05-30 MED ORDER — CEFAZOLIN SODIUM-DEXTROSE 2-4 GM/100ML-% IV SOLN
2.0000 g | INTRAVENOUS | Status: AC
  Administered 2017-05-30: 2 g via INTRAVENOUS

## 2017-05-30 MED ORDER — CEFAZOLIN SODIUM-DEXTROSE 2-4 GM/100ML-% IV SOLN
INTRAVENOUS | Status: AC
  Filled 2017-05-30: qty 100

## 2017-05-30 MED ORDER — MIDAZOLAM HCL 2 MG/2ML IJ SOLN
INTRAMUSCULAR | Status: AC
  Filled 2017-05-30: qty 2

## 2017-05-30 MED ORDER — HYDROMORPHONE HCL 1 MG/ML IJ SOLN
0.2500 mg | INTRAMUSCULAR | Status: DC | PRN
  Administered 2017-05-30 (×3): 0.5 mg via INTRAVENOUS
  Filled 2017-05-30: qty 1

## 2017-05-30 MED ORDER — DEXAMETHASONE SODIUM PHOSPHATE 4 MG/ML IJ SOLN
INTRAMUSCULAR | Status: AC
  Filled 2017-05-30: qty 2

## 2017-05-30 MED ORDER — PROPOFOL 10 MG/ML IV BOLUS
INTRAVENOUS | Status: AC
  Filled 2017-05-30: qty 20

## 2017-05-30 MED ORDER — ONDANSETRON HCL 4 MG/2ML IJ SOLN
INTRAMUSCULAR | Status: DC | PRN
  Administered 2017-05-30: 4 mg via INTRAVENOUS

## 2017-05-30 SURGICAL SUPPLY — 55 items
ANCHOR SUT BIOCOMP CORKSREW (Anchor) ×3 IMPLANT
BIT DRILL 2.0MX128MM (BIT) IMPLANT
BIT DRILL 2.0X128 (BIT) ×2 IMPLANT
BIT DRILL 2.0X128MM (BIT) ×1
BLADE HEX COATED 2.75 (ELECTRODE) ×3 IMPLANT
BLADE OSC/SAGITTAL MD 9X18.5 (BLADE) ×3 IMPLANT
BUR FAST CUTTING (BURR)
BUR ROUND 5.0 (BURR) IMPLANT
BUR SRG 54X4.7X12 FLUT (BURR) IMPLANT
BURR SRG 54X4.7X12 FLUT (BURR)
CHLORAPREP W/TINT 26ML (MISCELLANEOUS) ×3 IMPLANT
CLOTH BEACON ORANGE TIMEOUT ST (SAFETY) ×3 IMPLANT
COVER LIGHT HANDLE STERIS (MISCELLANEOUS) ×6 IMPLANT
DECANTER SPIKE VIAL GLASS SM (MISCELLANEOUS) ×6 IMPLANT
DRAPE PROXIMA HALF (DRAPES) ×3 IMPLANT
DRESSING MEPILEX BORDER 6X8 (GAUZE/BANDAGES/DRESSINGS) ×1 IMPLANT
DRSG MEPILEX BORDER 6X8 (GAUZE/BANDAGES/DRESSINGS) ×3
ELECT REM PT RETURN 9FT ADLT (ELECTROSURGICAL) ×3
ELECTRODE REM PT RTRN 9FT ADLT (ELECTROSURGICAL) ×1 IMPLANT
GLOVE BIOGEL PI IND STRL 6.5 (GLOVE) ×1 IMPLANT
GLOVE BIOGEL PI IND STRL 7.0 (GLOVE) ×1 IMPLANT
GLOVE BIOGEL PI INDICATOR 6.5 (GLOVE) ×2
GLOVE BIOGEL PI INDICATOR 7.0 (GLOVE) ×2
GLOVE SKINSENSE NS SZ8.0 LF (GLOVE) ×2
GLOVE SKINSENSE STRL SZ8.0 LF (GLOVE) ×1 IMPLANT
GLOVE SS N UNI LF 8.5 STRL (GLOVE) ×3 IMPLANT
GOWN STRL REUS W/TWL LRG LVL3 (GOWN DISPOSABLE) ×6 IMPLANT
GOWN STRL REUS W/TWL XL LVL3 (GOWN DISPOSABLE) ×3 IMPLANT
INST SET MINOR BONE (KITS) ×3 IMPLANT
KIT BLADEGUARD II DBL (SET/KITS/TRAYS/PACK) ×3 IMPLANT
KIT ROOM TURNOVER APOR (KITS) ×3 IMPLANT
MANIFOLD NEPTUNE II (INSTRUMENTS) ×3 IMPLANT
MARKER SKIN DUAL TIP RULER LAB (MISCELLANEOUS) ×3 IMPLANT
NEEDLE HYPO 21X1.5 SAFETY (NEEDLE) ×3 IMPLANT
NEEDLE MA TROC 1/2 (NEEDLE) IMPLANT
NEEDLE MAYO 6 CRC TAPER PT (NEEDLE) IMPLANT
NEEDLE SCORPION MULTI FIRE (NEEDLE) ×3 IMPLANT
NS IRRIG 1000ML POUR BTL (IV SOLUTION) ×3 IMPLANT
PACK TOTAL JOINT (CUSTOM PROCEDURE TRAY) ×3 IMPLANT
PAD ARMBOARD 7.5X6 YLW CONV (MISCELLANEOUS) ×3 IMPLANT
PASSER SUT CAPTURE FIRST (SUTURE) IMPLANT
PASSER SUT SWANSON 36MM LOOP (INSTRUMENTS) ×3 IMPLANT
RASP SM TEAR CROSS CUT (RASP) ×3 IMPLANT
SET BASIN LINEN APH (SET/KITS/TRAYS/PACK) ×3 IMPLANT
SLING ARM IMMOBILIZER LRG (SOFTGOODS) ×3 IMPLANT
STAPLER VISISTAT 35W (STAPLE) ×3 IMPLANT
SUT BONE WAX W31G (SUTURE) ×3 IMPLANT
SUT ETHIBOND NAB OS 4 #2 30IN (SUTURE) ×3 IMPLANT
SUT ETHILON 3 0 FSL (SUTURE) IMPLANT
SUT MON AB 0 CT1 (SUTURE) ×3 IMPLANT
SUT MON AB 2-0 CT1 36 (SUTURE) ×3 IMPLANT
SUT PROLENE 3 0 PS 1 (SUTURE) IMPLANT
SYR 30ML LL (SYRINGE) ×3 IMPLANT
SYR BULB IRRIGATION 50ML (SYRINGE) ×3 IMPLANT
YANKAUER SUCT 12FT TUBE ARGYLE (SUCTIONS) ×3 IMPLANT

## 2017-05-30 NOTE — Interval H&P Note (Signed)
History and Physical Interval Note:  05/30/2017 10:11 AM  Arthur Aguilar  has presented today for surgery, with the diagnosis of LEFT ROTATOR CUFF TEAR  The various methods of treatment have been discussed with the patient and family. After consideration of risks, benefits and other options for treatment, the patient has consented to  Procedure(s): ROTATOR CUFF REPAIR SHOULDER OPEN (Left) as a surgical intervention .  The patient's history has been reviewed, patient examined, no change in status, stable for surgery.  I have reviewed the patient's chart and labs.  Questions were answered to the patient's satisfaction.     [name]

## 2017-05-30 NOTE — Anesthesia Procedure Notes (Signed)
Procedure Name: LMA Insertion Date/Time: 05/30/2017 10:36 AM Performed by: Lajuana Carry E Pre-anesthesia Checklist: Patient identified, Emergency Drugs available, Suction available, Patient being monitored and Timeout performed Patient Re-evaluated:Patient Re-evaluated prior to inductionOxygen Delivery Method: Circle system utilized Preoxygenation: Pre-oxygenation with 100% oxygen Intubation Type: IV induction Ventilation: Mask ventilation without difficulty LMA: LMA inserted LMA Size: 5.0 Tube type: Oral Number of attempts: 1 Placement Confirmation: positive ETCO2 and breath sounds checked- equal and bilateral Tube secured with: Tape Dental Injury: Teeth and Oropharynx as per pre-operative assessment

## 2017-05-30 NOTE — Brief Op Note (Signed)
05/30/2017  12:14 PM  PATIENT:  Arthur Aguilar  56 y.o. male  PRE-OPERATIVE DIAGNOSIS:  LEFT ROTATOR CUFF TEAR  POST-OPERATIVE DIAGNOSIS:  LEFT ROTATOR CUFF TEAR, acromioclavicular arthritis  PROCEDURE:  Procedure(s): ROTATOR CUFF REPAIR SHOULDER OPEN (Left) Acromioplasty Distal clavicle excision   Operative findings Complex tear of the rotator cuff. The supraspinatus tendon was torn and was retracted 2 cm. There was also musculotendinous junction tear longitudinal front to back about 3 cm  1 corkscrew bio composite Arthrex suture anchor was placed with 2 fiber tapes   SURGEON:  Surgeon(s) and Role:    * [name], MD - Primary  PHYSICIAN ASSISTANT:   ASSISTANTS: betty ashley   ANESTHESIA:   general  EBL:  No intake/output data recorded.  BLOOD ADMINISTERED:none  DRAINS: none   LOCAL MEDICATIONS USED:  MARCAINE   , Amount: 60 ml and OTHER exparel 20  SPECIMEN:  No Specimen  DISPOSITION OF SPECIMEN:  N/A  COUNTS:  YES  TOURNIQUET:  * No tourniquets in log *  DICTATION: .Dragon Dictation  PLAN OF CARE: Discharge to home after PACU  PATIENT DISPOSITION:  PACU - hemodynamically stable.   Delay start of Pharmacological VTE agent (>24hrs) due to surgical blood loss or risk of bleeding: not applicable  [id number]

## 2017-05-30 NOTE — Anesthesia Preprocedure Evaluation (Signed)
Anesthesia Evaluation  Patient identified by MRN, date of birth, ID band  Airway Mallampati: I  TM Distance: >3 FB Neck ROM: Full   Comment: Full beard Dental  (+) Teeth Intact   Pulmonary former smoker,    Pulmonary exam normal        Cardiovascular Normal cardiovascular exam Rhythm:Regular Rate:Normal     Neuro/Psych  Neuromuscular disease    GI/Hepatic GERD  ,  Endo/Other    Renal/GU      Musculoskeletal  (+) Arthritis , Multiple arthritis/bone/joint pain complaints   Abdominal Normal abdominal exam  (+)   Peds  Hematology   Anesthesia Other Findings   Reproductive/Obstetrics                             Anesthesia Physical Anesthesia Plan  ASA: II  Anesthesia Plan: General   Post-op Pain Management:    Induction: Intravenous  PONV Risk Score and Plan: Ondansetron and Dexamethasone  Airway Management Planned: LMA  Additional Equipment:   Intra-op Plan:   Post-operative Plan: Extubation in OR  Informed Consent: I have reviewed the patients History and Physical, chart, labs and discussed the procedure including the risks, benefits and alternatives for the proposed anesthesia with the patient or authorized representative who has indicated his/her understanding and acceptance.     Plan Discussed with: CRNA  Anesthesia Plan Comments:         Anesthesia Quick Evaluation

## 2017-05-30 NOTE — Progress Notes (Signed)
Patient's wife forgot to sign avs for discharge instructions on way out to car.

## 2017-05-30 NOTE — Anesthesia Procedure Notes (Deleted)
Performed by: Verlin Grills

## 2017-05-30 NOTE — Op Note (Signed)
Surgical dictation   [phone] [id number]  05/30/2017 12:17 PM  Preop diagnosis torn rotator cuff left shoulder  Postop diagnosis torn rotator cuff left shoulder, acromial clavicular arthritis  Procedure open rotator cuff repair, acromioplasty, distal clavicle excision   Surgeon was Aline Brochure assisted by Simonne Maffucci  Surgical findings 2 cm retracted supraspinatus tendon tear with longitudinal split front to back 3 cm at the musculotendinous junction  Implants 1 Arthrex bio composite suture anchor with 2 fiber tapes  Surgery was done as follows  In the preop area the patient was identified surgical site was confirmed and marked left shoulder chart review completed  Patient taken to surgery. 2 g of Ancef were given IV and the patient underwent general anesthesia were no complications.  The patient was placed in a modified beachchair position with appropriate padding  Timeout was completed  The skin was infiltrated with Marcaine with epinephrine 10 mL. The incision was made over the acromioclavicular joint extended down across the acromion between the anterior and middle third of the deltoid  The subcutaneous taste tissue was divided in the median raphae was identified. This was split bluntly and then sharply up over the acromial clavicular joint into the deltotrapezial fascia  An acromioplasty was performed with an oscillating saw this was beveled with a power rasp and bone wax was applied to the end  The medial border of the acromion had a large osteophyte which was removed and contoured with a rasp  We also removed 7 mm of the distal clavicle, this was contoured with a rasp and then bone wax was applied  We then turned our attention to the rotator cuff tear and repair the musculotendinous junction with #2 Ethibond suture  We then repaired the rotator cuff tendon tear front to back with margin convergence using #2 Ethibond suture  We then placed 2 drill holes in the greater  tuberosity to gain access to the marrow  One suture anchor was then placed in the 2 fiber tapes were passed through the rotator cuff tendon repairing it back to its anatomic position  Range of motion showed no stress on the repair with the arm at his side or in flexion or abduction or external rotation  The wound was then irrigated. The deltoid trapezial fascia was closed with #2 Ethibond in interrupted and running sutures  The skin and soft tissues were infiltrated with Xparelll and Marcaine with epinephrine 0 Monocryl was used to close subcutaneous tissue staples were used to close the skin  A sterile bandage was applied and a sling was applied.  The patient was extubated and taken to recovery room in stable condition   Counts were correct   [phone] 23120-51

## 2017-05-30 NOTE — Transfer of Care (Signed)
Immediate Anesthesia Transfer of Care Note  Patient: Arthur Aguilar  Procedure(s) Performed: Procedure(s): ROTATOR CUFF REPAIR SHOULDER OPEN (Left)  Patient Location: PACU  Anesthesia Type:General  Level of Consciousness: awake, drowsy, patient cooperative and responds to stimulation  Airway & Oxygen Therapy: Patient Spontanous Breathing and Patient connected to nasal cannula oxygen  Post-op Assessment: Report given to RN and Post -op Vital signs reviewed and stable  Post vital signs: Reviewed and stable  Last Vitals:  Vitals:   05/30/17 0921 05/30/17 0923  BP:  (!) 134/98  Pulse: 89   Resp: (!) 22   Temp: 36.6 C     Last Pain:  Vitals:   05/30/17 0921  TempSrc: Oral         Complications: No apparent anesthesia complications

## 2017-05-30 NOTE — Anesthesia Postprocedure Evaluation (Signed)
Anesthesia Post Note  Patient: Arthur Aguilar  Procedure(s) Performed: Procedure(s) (LRB): ROTATOR CUFF REPAIR SHOULDER OPEN (Left)  Patient location during evaluation: PACU Anesthesia Type: General Level of consciousness: awake and alert, oriented and patient cooperative Pain management: pain level controlled Vital Signs Assessment: post-procedure vital signs reviewed and stable Respiratory status: spontaneous breathing, nonlabored ventilation and respiratory function stable Cardiovascular status: blood pressure returned to baseline Postop Assessment: no signs of nausea or vomiting Anesthetic complications: no     Last Vitals:  Vitals:   05/30/17 1335 05/30/17 1341  BP:  (!) 141/91  Pulse: 98 (!) 104  Resp: 14 16  Temp:  36.9 C    Last Pain:  Vitals:   05/30/17 1341  TempSrc: Oral  PainSc: 4                  Amaka Gluth J

## 2017-05-31 ENCOUNTER — Encounter (HOSPITAL_COMMUNITY): Payer: Self-pay | Admitting: Orthopedic Surgery

## 2017-06-05 ENCOUNTER — Encounter: Payer: Self-pay | Admitting: Orthopedic Surgery

## 2017-06-05 ENCOUNTER — Ambulatory Visit (INDEPENDENT_AMBULATORY_CARE_PROVIDER_SITE_OTHER): Payer: Managed Care, Other (non HMO) | Admitting: Orthopedic Surgery

## 2017-06-05 DIAGNOSIS — Z9889 Other specified postprocedural states: Secondary | ICD-10-CM

## 2017-06-05 DIAGNOSIS — Z4889 Encounter for other specified surgical aftercare: Secondary | ICD-10-CM

## 2017-06-05 MED ORDER — HYDROCODONE-ACETAMINOPHEN 7.5-325 MG PO TABS: 1.0000 | ORAL_TABLET | Freq: Four times a day (QID) | ORAL | 0 refills | Status: DC | PRN

## 2017-06-05 NOTE — Addendum Note (Signed)
Addended by: Baldomero Lamy B on: 06/05/2017 03:13 PM   Modules accepted: Orders

## 2017-06-05 NOTE — Progress Notes (Signed)
[facility]   Chief Complaint  Patient presents with  . Follow-up    left rotator cuff repair, DOS 05/30/17    Encounter Diagnoses  Name Primary?  Marland Kitchen Aftercare following surgery Yes  . S/P left rotator cuff repair      Current Outpatient Prescriptions:  .  diphenhydramine-acetaminophen (TYLENOL PM) 25-500 MG TABS tablet, Take 2 tablets by mouth at bedtime., Disp: , Rfl:  .  fenofibrate 160 MG tablet, Take 160 mg by mouth daily. , Disp: , Rfl:  .  Flaxseed, Linseed, (FLAXSEED OIL) 1000 MG CAPS, Take 1,000 mg by mouth 2 (two) times daily., Disp: , Rfl:  .  Glucosamine HCl (GLUCOSAMINE PO), Take 1 tablet by mouth daily., Disp: , Rfl:  .  ibuprofen (ADVIL,MOTRIN) 800 MG tablet, Take 1 tablet (800 mg total) by mouth every 8 (eight) hours as needed., Disp: 90 tablet, Rfl: 1 .  loratadine (ALLERGY RELIEF) 10 MG tablet, Take 10 mg by mouth daily., Disp: , Rfl:  .  methocarbamol (ROBAXIN-750) 750 MG tablet, Take 1 tablet (750 mg total) by mouth 4 (four) times daily., Disp: 28 tablet, Rfl: 1 .  Multiple Vitamin (MULTIVITAMIN WITH MINERALS) TABS tablet, Take 1 tablet by mouth daily. (Men's, Disp: , Rfl:  .  Omega-3 Fatty Acids (FISH OIL) 1000 MG CAPS, Take 2,000 mg by mouth 2 (two) times daily., Disp: , Rfl:  .  omeprazole (PRILOSEC OTC) 20 MG tablet, Take 20 mg by mouth daily., Disp: , Rfl:  .  HYDROcodone-acetaminophen (NORCO) 7.5-325 MG tablet, Take 1 tablet by mouth every 6 (six) hours as needed for moderate pain., Disp: 30 tablet, Rfl: 0  Meds ordered this encounter  Medications  . HYDROcodone-acetaminophen (NORCO) 7.5-325 MG tablet    Sig: Take 1 tablet by mouth every 6 (six) hours as needed for moderate pain.    Dispense:  30 tablet    Refill:  0    Wound clean  neurovascualr exam intact    Remove the staples 1 week  Start OT next week

## 2017-06-05 NOTE — Addendum Note (Signed)
Addended by: Baldomero Lamy B on: 06/05/2017 02:43 PM   Modules accepted: Orders

## 2017-06-07 ENCOUNTER — Telehealth (HOSPITAL_COMMUNITY): Payer: Self-pay | Admitting: Orthopedic Surgery

## 2017-06-07 NOTE — Telephone Encounter (Signed)
Called Dr. Ruthe Mannan office everyone was off for July 4th. Talked to Angie and she confirmed that Dr. Aline Brochure did write in the patient's note for patient to start OT  Next week when Dr. Aline Brochure saw patient on July 3. NF 06/07/17

## 2017-06-12 ENCOUNTER — Ambulatory Visit: Payer: Managed Care, Other (non HMO) | Admitting: Orthopedic Surgery

## 2017-06-12 DIAGNOSIS — Z9889 Other specified postprocedural states: Secondary | ICD-10-CM

## 2017-06-12 NOTE — Progress Notes (Signed)
Patient in today for staple removal of left shoulder s/p left rotator cuff repair 05/30/17. Wound looks great, no signs of infection. Cleaned wound with alcohol and staples removed. Patient tolerated well. To schedule 1 month follow up appointment with Dr Aline Brochure.

## 2017-06-19 ENCOUNTER — Telehealth: Payer: Self-pay | Admitting: Orthopedic Surgery

## 2017-06-19 NOTE — Telephone Encounter (Signed)
No   Sleep upright x a week then if persists see PMD

## 2017-06-19 NOTE — Telephone Encounter (Signed)
Routing to Dr Harrison 

## 2017-06-19 NOTE — Telephone Encounter (Signed)
Patient called asking if there is a reason why he feels like he is congested when he sleeps flat in his bed. When he sleeps in his recliner he doesn't have this problem. He stated that it has nothing to do with animal hair or anything like that, but it only happens when he sleeps flat in his bed.  He can be reached at [phone].  Please advise

## 2017-06-20 ENCOUNTER — Encounter (HOSPITAL_COMMUNITY): Payer: Self-pay | Admitting: Specialist

## 2017-06-20 ENCOUNTER — Ambulatory Visit (HOSPITAL_COMMUNITY): Payer: Managed Care, Other (non HMO) | Attending: Orthopedic Surgery | Admitting: Specialist

## 2017-06-20 DIAGNOSIS — M25512 Pain in left shoulder: Secondary | ICD-10-CM | POA: Diagnosis present

## 2017-06-20 DIAGNOSIS — R29898 Other symptoms and signs involving the musculoskeletal system: Secondary | ICD-10-CM | POA: Diagnosis present

## 2017-06-20 DIAGNOSIS — M25612 Stiffness of left shoulder, not elsewhere classified: Secondary | ICD-10-CM | POA: Diagnosis not present

## 2017-06-20 NOTE — Telephone Encounter (Signed)
Called patient, no answer, left message.

## 2017-06-20 NOTE — Therapy (Signed)
Arthur Aguilar Arthur Agu[place], [place], [address] Phone: [phone]   Fax:  [phone]  Occupational Therapy Evaluation  Patient Details  Name: Arthur Aguilar MRN: [id number] Date of Birth: 17-Aug-1961 Referring Provider: Dr. [name]  Encounter Date: 06/20/2017      OT End of Session - 06/20/17 1327    Visit Number 1   Number of Visits 16   Date for OT Re-Evaluation 08/19/17  07/19/17   Authorization Type [employer]et[employer]   OT Start Time 0905   OT Stop Time 0950   OT Time Calculation (min) 45 min   Activity Tolerance Patient tolerated treatment well   Behavior During Therapy [facility] for tasks assessed/performed      Past Medical History:  Diagnosis Date  . Arthritis   . GERD (gastroesophageal reflux disease)   . History of kidney stones   . Hypercholesteremia   . Scoliosis     Past Surgical History:  Procedure Laterality Date  . CARPAL TUNNEL RELEASE Right 04/02/2015   Procedure: RIGHT CARPAL TUNNEL RELEASE;  Surgeon: [name], MD;  Location: [facility];  Service: Orthopedics;  Laterality: Right;  . dental bone implant     bottom - right jaw.  Marland Kitchen HIP SURGERY    . left hip replacement    . SHOULDER OPEN ROTATOR CUFF REPAIR Left 05/30/2017   Procedure: ROTATOR CUFF REPAIR SHOULDER OPEN;  Surgeon: [name], MD;  Location: [facility];  Service: Orthopedics;  Laterality: Left;    There were no vitals filed for this visit.      Subjective Assessment - 06/20/17 1304    Subjective  S:  I am really anxious to get back to using my arm.     Pertinent History Arthur Aguilar reports chronic shoulder pain and discomfort.  He had open left rotator cuff repair on 05/30/17.  He has been referred to occupational therapy for evaluation and treatment.     Patient Stated Goals I want to move my arm and get back to riding my motorcycle as soon as possible.    Currently in Pain? Yes   Pain Score 4    Pain Location Shoulder   Pain  Orientation Left   Pain Descriptors / Indicators Aching   Pain Type Acute pain   Pain Radiating Towards elbow   Pain Onset 1 to 4 weeks ago   Pain Frequency Intermittent   Aggravating Factors  movement   Pain Relieving Factors rest   Effect of Pain on Daily Activities moderate           [facility] OT Assessment - 06/20/17 0001      Assessment   Diagnosis S/P Open Left Rotator Cuff Repair   Referring Provider Dr. [name]   Onset Date 05/30/17     Precautions   Precautions Shoulder   Type of Shoulder Precautions sling;  P/ROM through 8/22, AA/ROM through 10/03, after 10/03 begin A/ROM and PRE     Restrictions   Weight Bearing Restrictions No     Balance Screen   Has the patient fallen in the past 6 months No   Has the patient had a decrease in activity level because of a fear of falling?  No   Is the patient reluctant to leave their home because of a fear of falling?  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 unable to sleep in supine     Written Expression   Dominant Hand Right     Vision - History   Baseline Vision Wears glasses all the time     Cognition   Overall Cognitive Status Within Functional Limits for tasks assessed     Observation/Other Assessments   Skin Integrity 4 inch anterior shoulder healed surgical incision.  scar is raised and dense.   Focus on Therapeutic Outcomes (FOTO)   complete next session     Sensation   Light Touch Appears Intact     Coordination   Gross Motor Movements are Fluid and Coordinated Yes   Fine Motor Movements are Fluid and Coordinated Yes     ROM /  Strength   AROM / PROM / Strength AROM;PROM;Strength     Palpation   Palpation comment mod-max fascial restrictions in left shoulder region     AROM   Overall AROM Comments not assessed due to recent surgery   AROM Assessment Site Shoulder   Right/Left Shoulder Left     PROM   Overall PROM Comments assessed in supine, external and internal rotation with shoulder adducted   PROM Assessment Site Shoulder   Right/Left Shoulder Left   Left Shoulder Flexion 86 Degrees   Left Shoulder ABduction 70 Degrees   Left Shoulder Internal Rotation 76 Degrees   Left Shoulder External Rotation 40 Degrees     Strength   Overall Strength Comments not assessed due to recent surgery   Strength Assessment Site Shoulder   Right/Left Shoulder Left                  OT Treatments/Exercises (OP) - 06/20/17 0001      Exercises   Exercises Shoulder     Manual Therapy   Manual Therapy Myofascial release   Manual therapy comments completed seperately from all other interventions   Myofascial Release myofascial release and manual stretching to left upper arm, scapular, and shoulder region to decrease pain and restrictions and improve pain free mobility in left shoulder region.  Scar massage to healed surgical scar.                OT Education - 06/20/17 1326    Education provided Yes   Education Details towel slides 1-3 minutes each, 2-3 times per day.  issued scar pad for use on healed surgical incision.  patient to place pad on scar while sleeping for improved mobility of scar tissue .   Person(s) Educated Patient   Methods Explanation;Handout;Demonstration   Comprehension Verbalized understanding          OT Short Term Goals - 06/20/17 1332      OT SHORT TERM GOAL #1   Title Patient will be educated on HEP for improved shoulder mobility.   Time 4   Period Weeks   Status New     OT SHORT TERM GOAL #2   Title Patient will improve left shoulder P/ROM to [facility] in order to don  and doff shirts easier.    Time 4   Period Weeks   Status New     OT SHORT TERM GOAL #3   Title Patient  will improve left shoulder strength to 3/5 for improved ability to lift parts at work.    Time 4   Period Weeks   Status New     OT SHORT TERM GOAL #4   Title Patient will decrease pain level to 3/10 or better in left shoulder when using his computer.    Time 4   Period Weeks   Status New     OT SHORT TERM GOAL #5   Title Patient will improve left shoulder fascial restrictions to min-mod for greater mobility needed for ADL completion.    Time 4   Period Weeks   Status New           OT Long Term Goals - 06/20/17 1334      OT LONG TERM GOAL #1   Title Patient will return to prior level of function using left arm normally with all other ADLs, work, leisure activities.    Time 8   Period Weeks   Status New     OT LONG TERM GOAL #2   Title Patient will have WNL A/ROM in left shoulder needed to reach overhead and to steer his motorcycle.   Time 8   Period Weeks   Status New     OT LONG TERM GOAL #3   Title Patient will have 5/5 strength in his left shoulder for improved ability to lift his kayak onto his truck.    Time 8   Period Weeks   Status New     OT LONG TERM GOAL #4   Title Patient will have 2/10 or better pain in his left shoulder while riding his motorcycle.   Time 8   Period Weeks   Status New     OT LONG TERM GOAL #5   Title Patient will have trace fascial and scar restrictions in his left shoulder region.    Time 8   Period Weeks   Status New               Plan - 06/20/17 1328    Clinical Impression Statement A:  Patient is a 57 year old male with past medical history that includes right carpal tunnel release, hip replacement, arthritis.  Patient underwent open rotator cuff repair surgery on 05/30/17.  Patient referred to occupational therapy for evauation and treatment. Patient currently unable to use his left arm above waist height with  any funcitonal activities.    Occupational Profile and client history currently impacting functional performance see above   Occupational performance deficits (Please refer to evaluation for details): ADL's;IADL's;Rest and Sleep;Work;Leisure;Social Participation   Rehab Potential Excellent   Current Impairments/barriers affecting progress: age, motiviation   OT Frequency 2x / week   OT Duration 8 weeks   OT Treatment/Interventions Self-care/ADL training;Cryotherapy;Electrical Stimulation;Moist Heat;Ultrasound;Therapeutic exercise;Iontophoresis;Energy conservation;DME and/or AE instruction;Manual Therapy;Scar mobilization;Passive range of motion;Therapeutic activities;Therapeutic exercises;Patient/family education   Plan P:  Skilled OT intervention to improve pain free range and strength in left arm in order to return to prior level of independence with all B/IADLs, work, and leisure activities.  Next session: P/ROM, isometrics, review plan of care.    Clinical Decision Making Limited treatment options, no task modification necessary   OT Home Exercise Plan 06/20/17:  towel slides   Consulted and Agree with Plan of Care Patient      Patient will benefit from skilled therapeutic intervention in order to improve the following deficits and impairments:  Decreased range of motion, Decreased knowledge of precautions, Decreased strength,  Decreased scar mobility, Decreased skin integrity, Increased fascial restricitons, Increased muscle spasms, Impaired UE functional use, Pain  Visit Diagnosis: Stiffness of left shoulder, not elsewhere classified - Plan: Ot plan of care cert/re-cert  Acute pain of left shoulder - Plan: Ot plan of care cert/re-cert  Other symptoms and signs involving the musculoskeletal system - Plan: Ot plan of care cert/re-cert    Problem List Patient Active Problem List   Diagnosis Date Noted  . Complete tear of left rotator cuff   . Carpal tunnel syndrome of right wrist   .  Pain in left hip 03/23/2014  . Status post hip replacement 03/23/2014  . S/P hip replacement 09/18/2013  . Right Achilles tendinitis 12/27/2011  . History of total hip arthroplasty 09/05/2011  . Achilles bursitis or tendinitis 08/17/2011  . HIP, ARTHRITIS, DEGEN./OSTEO 11/30/2009  . BACK PAIN 11/30/2009    Vangie Bicker, [place], OTR/L [phone]  06/20/2017, 1:39 PM  [place] [address] [place], [place], [address] Phone: [phone]   Fax:  [phone]  Name: Arthur Aguilar MRN: [id number] Date of Birth: Jul 16, 1961

## 2017-06-20 NOTE — Patient Instructions (Signed)
COMPLETE EACH EXERCISE 1-3 MINUTES EACH 2-3 TIMES PER DAY.    SHOULDER: Flexion On Table   Place hands on table, elbows straight. Move hips away from body. Press hands down into table. Hold ___ seconds. ___ reps per set, ___ sets per day, ___ days per week  Abduction (Passive)   With arm out to side, resting on table, lower head toward arm, keeping trunk away from table. Hold ____ seconds. Repeat ____ times. Do ____ sessions per day.  Copyright  VHI. All rights reserved.     Internal Rotation (Assistive)   Seated with elbow bent at right angle and held against side, slide arm on table surface in an inward arc. Repeat ____ times. Do ____ sessions per day. Activity: Use this motion to brush crumbs off the table.  Copyright  VHI. All rights reserved.

## 2017-06-25 ENCOUNTER — Ambulatory Visit (HOSPITAL_COMMUNITY): Payer: Managed Care, Other (non HMO) | Admitting: Specialist

## 2017-06-25 DIAGNOSIS — M25612 Stiffness of left shoulder, not elsewhere classified: Secondary | ICD-10-CM | POA: Diagnosis not present

## 2017-06-25 DIAGNOSIS — M25512 Pain in left shoulder: Secondary | ICD-10-CM

## 2017-06-25 DIAGNOSIS — R29898 Other symptoms and signs involving the musculoskeletal system: Secondary | ICD-10-CM

## 2017-06-25 NOTE — Therapy (Signed)
Arthur Aguilar, [place], [address] Phone: [phone]   Fax:  [phone]  Occupational Therapy Treatment  Patient Details  Name: Arthur Aguilar MRN: [id number] Date of Birth: 1961/10/06 Referring Provider: Dr. [name]  Encounter Date: 06/25/2017      OT End of Session - 06/25/17 1321    Visit Number 2   Number of Visits 16   Date for OT Re-Evaluation 08/19/17  mini reassess on 8/16   Authorization Type [employer]et[employer]   OT Start Time 0820   OT Stop Time 0855   OT Time Calculation (min) 35 min   Activity Tolerance Patient tolerated treatment well   Behavior During Therapy [facility] for tasks assessed/performed      Past Medical History:  Diagnosis Date  . Arthritis   . GERD (gastroesophageal reflux disease)   . History of kidney stones   . Hypercholesteremia   . Scoliosis     Past Surgical History:  Procedure Laterality Date  . CARPAL TUNNEL RELEASE Right 04/02/2015   Procedure: RIGHT CARPAL TUNNEL RELEASE;  Surgeon: [name], MD;  Location: [facility];  Service: Orthopedics;  Laterality: Right;  . dental bone implant     bottom - right jaw.  Marland Kitchen HIP SURGERY    . left hip replacement    . SHOULDER OPEN ROTATOR CUFF REPAIR Left 05/30/2017   Procedure: ROTATOR CUFF REPAIR SHOULDER OPEN;  Surgeon: [name], MD;  Location: [facility];  Service: Orthopedics;  Laterality: Left;    There were no vitals filed for this visit.      Subjective Assessment - 06/25/17 1319    Subjective  S:  I did the exercises, they pull a little bit.    Special Tests FOTO 44/100   Currently in Pain? Yes   Pain Score 4    Pain Location Shoulder   Pain Orientation Left   Pain Descriptors / Indicators Aching   Pain Type Acute pain            [facility] OT Assessment - 06/25/17 0001      Assessment   Diagnosis S/P Open Left Rotator Cuff Repair     Precautions   Precautions Shoulder   Type of Shoulder Precautions Per  MD:  PROM AS TOLERATED NOW UP TO 6 WEEKS POST OP (07/11/17), progress as tolerated 07/12/17      Observation/Other Assessments   Focus on Therapeutic Outcomes (FOTO)  44/100                  OT Treatments/Exercises (OP) - 06/25/17 0001      Exercises   Exercises Shoulder     Shoulder Exercises: Supine   Protraction PROM;10 reps   Horizontal ABduction PROM;10 reps   External Rotation PROM;10 reps   Internal Rotation PROM;10 reps   Flexion PROM;10 reps   ABduction PROM;10 reps     Shoulder Exercises: Seated   Elevation AROM;10 reps   Extension AROM;10 reps   Row AROM;10 reps     Shoulder Exercises: Therapy Ball   Flexion 10 reps   ABduction 10 reps     Shoulder Exercises: ROM/Strengthening   Caudal Glide 5 X 5"     Shoulder Exercises: Isometric Strengthening   Flexion Supine;3X3"   Extension Supine;3X3"   External Rotation Supine;3X3"   Internal Rotation Supine;3X3"   ABduction Supine;3X3"   ADduction Supine;3X3"     Manual Therapy   Manual Therapy Myofascial release   Manual therapy  comments completed seperately from all other interventions   Myofascial Release myofascial release and manual stretching to left upper arm, scapular, and shoulder region to decrease pain and restrictions and improve pain free mobility in left shoulder region.  Scar massage to healed surgical scar.                 OT Education - 06/25/17 1320    Education provided Yes   Education Details reviewed plan of care with patient and issued a written copy   Person(s) Educated Patient   Methods Explanation;Handout   Comprehension Verbalized understanding          OT Short Term Goals - 06/25/17 1322      OT SHORT TERM GOAL #1   Title Patient will be educated on HEP for improved shoulder mobility.   Time 4   Period Weeks   Status On-going     OT SHORT TERM GOAL #2   Title Patient will improve left shoulder P/ROM to [facility] in order to don and doff shirts easier.    Time 4    Period Weeks   Status On-going     OT SHORT TERM GOAL #3   Title Patient will improve left shoulder strength to 3/5 for improved ability to lift parts at work.    Time 4   Period Weeks   Status On-going     OT SHORT TERM GOAL #4   Title Patient will decrease pain level to 3/10 or better in left shoulder when using his computer.    Time 4   Period Weeks   Status On-going     OT SHORT TERM GOAL #5   Title Patient will improve left shoulder fascial restrictions to min-mod for greater mobility needed for ADL completion.    Time 4   Period Weeks   Status On-going           OT Long Term Goals - 06/25/17 1323      OT LONG TERM GOAL #1   Title Patient will return to prior level of function using left arm normally with all other ADLs, work, leisure activities.    Time 8   Period Weeks   Status On-going     OT LONG TERM GOAL #2   Title Patient will have WNL A/ROM in left shoulder needed to reach overhead and to steer his motorcycle.   Time 8   Period Weeks   Status On-going     OT LONG TERM GOAL #3   Title Patient will have 5/5 strength in his left shoulder for improved ability to lift his kayak onto his truck.    Time 8   Period Weeks   Status On-going     OT LONG TERM GOAL #4   Title Patient will have 2/10 or better pain in his left shoulder while riding his motorcycle.   Time 8   Period Weeks   Status On-going     OT LONG TERM GOAL #5   Title Patient will have trace fascial and scar restrictions in his left shoulder region.    Time 8   Period Weeks   Status On-going               Plan - 06/25/17 1321    Clinical Impression Statement A:  Patient had minimal redness in scar region after use of scar remodeling pad.  Recommended use of scar pad at night while sleeping only.  Improved P/ROM in shoulder all ranges this date  noted.    Plan P:  continue to improve P/ROM to [facility] in prep for AA/ROM beginning 8/8.        Patient will benefit from skilled  therapeutic intervention in order to improve the following deficits and impairments:  Decreased range of motion, Decreased knowledge of precautions, Decreased strength, Decreased scar mobility, Decreased skin integrity, Increased fascial restricitons, Increased muscle spasms, Impaired UE functional use, Pain  Visit Diagnosis: Stiffness of left shoulder, not elsewhere classified  Acute pain of left shoulder  Other symptoms and signs involving the musculoskeletal system    Problem List Patient Active Problem List   Diagnosis Date Noted  . Complete tear of left rotator cuff   . Carpal tunnel syndrome of right wrist   . Pain in left hip 03/23/2014  . Status post hip replacement 03/23/2014  . S/P hip replacement 09/18/2013  . Right Achilles tendinitis 12/27/2011  . History of total hip arthroplasty 09/05/2011  . Achilles bursitis or tendinitis 08/17/2011  . HIP, ARTHRITIS, DEGEN./OSTEO 11/30/2009  . BACK PAIN 11/30/2009   Vangie Bicker, [place], OTR/L [phone]  06/25/2017, 1:26 PM  [place] [address] [place], [place], [address] Phone: [phone]   Fax:  [phone]  Name: MEYER ARORA MRN: [id number] Date of Birth: 1961/09/17

## 2017-06-28 ENCOUNTER — Ambulatory Visit (HOSPITAL_COMMUNITY): Payer: Managed Care, Other (non HMO) | Admitting: Specialist

## 2017-06-29 ENCOUNTER — Ambulatory Visit (HOSPITAL_COMMUNITY): Payer: Managed Care, Other (non HMO) | Admitting: Specialist

## 2017-06-29 ENCOUNTER — Encounter (HOSPITAL_COMMUNITY): Payer: Self-pay | Admitting: Specialist

## 2017-06-29 DIAGNOSIS — M25612 Stiffness of left shoulder, not elsewhere classified: Secondary | ICD-10-CM | POA: Diagnosis not present

## 2017-06-29 DIAGNOSIS — M25512 Pain in left shoulder: Secondary | ICD-10-CM

## 2017-06-29 DIAGNOSIS — R29898 Other symptoms and signs involving the musculoskeletal system: Secondary | ICD-10-CM

## 2017-06-29 NOTE — Therapy (Signed)
[facility] [place], [place], [address] Phone: [phone]   Fax:  [phone]  Occupational Therapy Treatment  Patient Details  Name: Arthur Aguilar MRN: [id number] Date of Birth: Jul 17, 1961 Referring Provider: Dr. [name]  Encounter Date: 06/29/2017      OT End of Session - 06/29/17 0845    Visit Number 3   Number of Visits 16   Date for OT Re-Evaluation 08/19/17  8/16   OT Start Time 0819   OT Stop Time 0900   OT Time Calculation (min) 41 min   Activity Tolerance Patient tolerated treatment well   Behavior During Therapy [facility] for tasks assessed/performed      Past Medical History:  Diagnosis Date  . Arthritis   . GERD (gastroesophageal reflux disease)   . History of kidney stones   . Hypercholesteremia   . Scoliosis     Past Surgical History:  Procedure Laterality Date  . CARPAL TUNNEL RELEASE Right 04/02/2015   Procedure: RIGHT CARPAL TUNNEL RELEASE;  Surgeon: [name], MD;  Location: [facility];  Service: Orthopedics;  Laterality: Right;  . dental bone implant     bottom - right jaw.  Marland Kitchen HIP SURGERY    . left hip replacement    . SHOULDER OPEN ROTATOR CUFF REPAIR Left 05/30/2017   Procedure: ROTATOR CUFF REPAIR SHOULDER OPEN;  Surgeon: [name], MD;  Location: [facility];  Service: Orthopedics;  Laterality: Left;    There were no vitals filed for this visit.      Subjective Assessment - 06/29/17 0844    Subjective  S:  I pushed mowed last night but I left my sling on.   Currently in Pain? Yes   Pain Score 3    Pain Location Shoulder   Pain Orientation Left   Pain Descriptors / Indicators Aching   Pain Type Acute pain            [facility] OT Assessment - 06/29/17 0001      Assessment   Diagnosis S/P Open Left Rotator Cuff Repair     Precautions   Precautions Shoulder   Type of Shoulder Precautions Per MD:  PROM AS TOLERATED NOW UP TO 6 WEEKS POST OP (07/11/17), progress as  tolerated 07/12/17                   OT Treatments/Exercises (OP) - 06/29/17 0001      Exercises   Exercises Shoulder     Shoulder Exercises: Supine   Protraction PROM;10 reps   Horizontal ABduction PROM;10 reps   External Rotation PROM;10 reps   Internal Rotation PROM;10 reps   Flexion PROM;10 reps   ABduction PROM;10 reps     Shoulder Exercises: Seated   Elevation AROM;15 reps   Extension AROM;15 reps   Row AROM;15 reps     Shoulder Exercises: Therapy Ball   Flexion 15 reps   ABduction 15 reps     Shoulder Exercises: ROM/Strengthening   Thumb Tacks 1 min low arms   Anterior Glide 5 X 5"   Caudal Glide 5 X 5"   Prot/Ret//Elev/Dep 1 min low arms     Shoulder Exercises: Isometric Strengthening   Flexion Supine;3X5"   Extension Supine;3X5"   External Rotation Supine;3X5"   Internal Rotation Supine;3X5"   ABduction Supine;3X5"   ADduction Supine;3X5"     Manual Therapy   Manual Therapy Myofascial release   Manual therapy comments completed seperately from all other  interventions   Myofascial Release myofascial release and manual stretching to left upper arm, scapular, and shoulder region to decrease pain and restrictions and improve pain free mobility in left shoulder region.  Scar massage to healed surgical scar.                   OT Short Term Goals - 06/25/17 1322      OT SHORT TERM GOAL #1   Title Patient will be educated on HEP for improved shoulder mobility.   Time 4   Period Weeks   Status On-going     OT SHORT TERM GOAL #2   Title Patient will improve left shoulder P/ROM to [facility] in order to don and doff shirts easier.    Time 4   Period Weeks   Status On-going     OT SHORT TERM GOAL #3   Title Patient will improve left shoulder strength to 3/5 for improved ability to lift parts at work.    Time 4   Period Weeks   Status On-going     OT SHORT TERM GOAL #4   Title Patient will decrease pain level to 3/10 or better in left  shoulder when using his computer.    Time 4   Period Weeks   Status On-going     OT SHORT TERM GOAL #5   Title Patient will improve left shoulder fascial restrictions to min-mod for greater mobility needed for ADL completion.    Time 4   Period Weeks   Status On-going           OT Long Term Goals - 06/25/17 1323      OT LONG TERM GOAL #1   Title Patient will return to prior level of function using left arm normally with all other ADLs, work, leisure activities.    Time 8   Period Weeks   Status On-going     OT LONG TERM GOAL #2   Title Patient will have WNL A/ROM in left shoulder needed to reach overhead and to steer his motorcycle.   Time 8   Period Weeks   Status On-going     OT LONG TERM GOAL #3   Title Patient will have 5/5 strength in his left shoulder for improved ability to lift his kayak onto his truck.    Time 8   Period Weeks   Status On-going     OT LONG TERM GOAL #4   Title Patient will have 2/10 or better pain in his left shoulder while riding his motorcycle.   Time 8   Period Weeks   Status On-going     OT LONG TERM GOAL #5   Title Patient will have trace fascial and scar restrictions in his left shoulder region.    Time 8   Period Weeks   Status On-going               Plan - 06/29/17 0940    Clinical Impression Statement A:  patient with improved P/ROM this date.  increased contraction time with isometric contractions.     Plan P:  Increase contraction time to 10" for isometrics, improbe P/ROM to WNL for ADL completion.       Patient will benefit from skilled therapeutic intervention in order to improve the following deficits and impairments:  Decreased range of motion, Decreased knowledge of precautions, Decreased strength, Decreased scar mobility, Decreased skin integrity, Increased fascial restricitons, Increased muscle spasms, Impaired UE functional use, Pain  Visit  Diagnosis: Stiffness of left shoulder, not elsewhere  classified  Acute pain of left shoulder  Other symptoms and signs involving the musculoskeletal system    Problem List Patient Active Problem List   Diagnosis Date Noted  . Complete tear of left rotator cuff   . Carpal tunnel syndrome of right wrist   . Pain in left hip 03/23/2014  . Status post hip replacement 03/23/2014  . S/P hip replacement 09/18/2013  . Right Achilles tendinitis 12/27/2011  . History of total hip arthroplasty 09/05/2011  . Achilles bursitis or tendinitis 08/17/2011  . HIP, ARTHRITIS, DEGEN./OSTEO 11/30/2009  . BACK PAIN 11/30/2009    Vangie Bicker, [place], OTR/L [phone]  06/29/2017, 9:48 AM  [facility] [address] [place], [place], [address] Phone: [phone]   Fax:  [phone]  Name: JAMAAL BERNASCONI MRN: [id number] Date of Birth: 05/29/1961

## 2017-07-03 ENCOUNTER — Encounter (HOSPITAL_COMMUNITY): Payer: Self-pay | Admitting: Specialist

## 2017-07-03 ENCOUNTER — Ambulatory Visit (HOSPITAL_COMMUNITY): Payer: Managed Care, Other (non HMO) | Admitting: Specialist

## 2017-07-03 DIAGNOSIS — R29898 Other symptoms and signs involving the musculoskeletal system: Secondary | ICD-10-CM

## 2017-07-03 DIAGNOSIS — M25612 Stiffness of left shoulder, not elsewhere classified: Secondary | ICD-10-CM

## 2017-07-03 DIAGNOSIS — M25512 Pain in left shoulder: Secondary | ICD-10-CM

## 2017-07-03 NOTE — Therapy (Signed)
[place], [place], [address] Phone: [phone]   Fax:  [phone]  Occupational Therapy Treatment  Patient Details  Name: Arthur Aguilar MRN: [id number] Date of Birth: Jan 26, 1961 Referring Provider: Dr. [name]  Encounter Date: 07/03/2017      OT End of Session - 07/03/17 1115    Visit Number 4   Number of Visits 16   Date for OT Re-Evaluation 08/19/17  8/16   Authorization Type [employer]   OT Start Time [phone]   OT Stop Time 0945   OT Time Calculation (min) 40 min   Activity Tolerance Patient tolerated treatment well   Behavior During Therapy [facility] for tasks assessed/performed      Past Medical History:  Diagnosis Date  . Arthritis   . GERD (gastroesophageal reflux disease)   . History of kidney stones   . Hypercholesteremia   . Scoliosis     Past Surgical History:  Procedure Laterality Date  . CARPAL TUNNEL RELEASE Right 04/02/2015   Procedure: RIGHT CARPAL TUNNEL RELEASE;  Surgeon: [name], MD;  Location: [facility];  Service: Orthopedics;  Laterality: Right;  . dental bone implant     bottom - right jaw.  Marland Kitchen HIP SURGERY    . left hip replacement    . SHOULDER OPEN ROTATOR CUFF REPAIR Left 05/30/2017   Procedure: ROTATOR CUFF REPAIR SHOULDER OPEN;  Surgeon: [name], MD;  Location: [facility];  Service: Orthopedics;  Laterality: Left;    There were no vitals filed for this visit.      Subjective Assessment - 07/03/17 1115    Subjective  S:  I am just a little sore.  I know its going to take time because my shoulder was injured for so long.    Currently in Pain? Yes   Pain Score 3    Pain Location Shoulder   Pain Orientation Left   Pain Descriptors / Indicators Aching   Pain Type Acute pain            [facility] OT Assessment - 07/03/17 0001      Assessment   Diagnosis S/P Open Left Rotator Cuff Repair     Precautions   Precautions Shoulder   Type of Shoulder  Precautions Per MD:  PROM AS TOLERATED NOW UP TO 6 WEEKS POST OP (07/11/17), progress as tolerated 07/12/17                   OT Treatments/Exercises (OP) - 07/03/17 0001      Exercises   Exercises Shoulder     Shoulder Exercises: Supine   Protraction PROM;10 reps   Horizontal ABduction PROM;10 reps   External Rotation PROM;10 reps   Internal Rotation PROM;10 reps   Flexion PROM;10 reps   ABduction PROM;10 reps     Shoulder Exercises: Seated   Elevation AROM;15 reps   Extension AROM;15 reps   Row AROM;15 reps     Shoulder Exercises: Therapy Ball   Flexion 20 reps   ABduction 20 reps     Shoulder Exercises: ROM/Strengthening   Thumb Tacks 1 min low arms   Prot/Ret//Elev/Dep 1 min low arms     Shoulder Exercises: Isometric Strengthening   Flexion Supine;5X10"   Extension Supine;5X10"   External Rotation Supine;5X10"   Internal Rotation Supine;5X10"   ABduction Supine;5X10"   ADduction Supine;5X10"     Manual Therapy   Manual Therapy Myofascial release   Manual therapy comments completed seperately  from all other interventions   Myofascial Release myofascial release and manual stretching to left upper arm, scapular, and shoulder region to decrease pain and restrictions and improve pain free mobility in left shoulder region.  Scar massage to healed surgical scar.                   OT Short Term Goals - 06/25/17 1322      OT SHORT TERM GOAL #1   Title Patient will be educated on HEP for improved shoulder mobility.   Time 4   Period Weeks   Status On-going     OT SHORT TERM GOAL #2   Title Patient will improve left shoulder P/ROM to [facility] in order to don and doff shirts easier.    Time 4   Period Weeks   Status On-going     OT SHORT TERM GOAL #3   Title Patient will improve left shoulder strength to 3/5 for improved ability to lift parts at work.    Time 4   Period Weeks   Status On-going     OT SHORT TERM GOAL #4   Title Patient will decrease  pain level to 3/10 or better in left shoulder when using his computer.    Time 4   Period Weeks   Status On-going     OT SHORT TERM GOAL #5   Title Patient will improve left shoulder fascial restrictions to min-mod for greater mobility needed for ADL completion.    Time 4   Period Weeks   Status On-going           OT Long Term Goals - 06/25/17 1323      OT LONG TERM GOAL #1   Title Patient will return to prior level of function using left arm normally with all other ADLs, work, leisure activities.    Time 8   Period Weeks   Status On-going     OT LONG TERM GOAL #2   Title Patient will have WNL A/ROM in left shoulder needed to reach overhead and to steer his motorcycle.   Time 8   Period Weeks   Status On-going     OT LONG TERM GOAL #3   Title Patient will have 5/5 strength in his left shoulder for improved ability to lift his kayak onto his truck.    Time 8   Period Weeks   Status On-going     OT LONG TERM GOAL #4   Title Patient will have 2/10 or better pain in his left shoulder while riding his motorcycle.   Time 8   Period Weeks   Status On-going     OT LONG TERM GOAL #5   Title Patient will have trace fascial and scar restrictions in his left shoulder region.    Time 8   Period Weeks   Status On-going               Plan - 07/03/17 1116    Clinical Impression Statement A:  Patient with decreased scapulohumeral rhythm which is causing delays with progression of abduction P/ROM.  mobilized and facilitated scapular region this date to promote imrpoved rhythm and improved movement.    Plan P:  increase contraction time to 15" for isometrics.  Add pulleys for improved P/ROM.        Patient will benefit from skilled therapeutic intervention in order to improve the following deficits and impairments:  Decreased range of motion, Decreased knowledge of precautions, Decreased strength, Decreased  scar mobility, Decreased skin integrity, Increased fascial  restricitons, Increased muscle spasms, Impaired UE functional use, Pain  Visit Diagnosis: Stiffness of left shoulder, not elsewhere classified  Acute pain of left shoulder  Other symptoms and signs involving the musculoskeletal system    Problem List Patient Active Problem List   Diagnosis Date Noted  . Complete tear of left rotator cuff   . Carpal tunnel syndrome of right wrist   . Pain in left hip 03/23/2014  . Status post hip replacement 03/23/2014  . S/P hip replacement 09/18/2013  . Right Achilles tendinitis 12/27/2011  . History of total hip arthroplasty 09/05/2011  . Achilles bursitis or tendinitis 08/17/2011  . HIP, ARTHRITIS, DEGEN./OSTEO 11/30/2009  . BACK PAIN 11/30/2009    Vangie Bicker, [place], OTR/L [phone]  07/03/2017, 11:20 AM  [place], [place], [address] Phone: [phone]   Fax:  [phone]  Name: Arthur Aguilar MRN: [id number] Date of Birth: 24-Dec-1960

## 2017-07-05 ENCOUNTER — Ambulatory Visit (HOSPITAL_COMMUNITY): Payer: Managed Care, Other (non HMO) | Attending: Orthopedic Surgery

## 2017-07-05 ENCOUNTER — Encounter (HOSPITAL_COMMUNITY): Payer: Self-pay

## 2017-07-05 DIAGNOSIS — M25512 Pain in left shoulder: Secondary | ICD-10-CM | POA: Insufficient documentation

## 2017-07-05 DIAGNOSIS — M25612 Stiffness of left shoulder, not elsewhere classified: Secondary | ICD-10-CM | POA: Insufficient documentation

## 2017-07-05 DIAGNOSIS — R29898 Other symptoms and signs involving the musculoskeletal system: Secondary | ICD-10-CM | POA: Diagnosis present

## 2017-07-05 NOTE — Patient Instructions (Signed)
Shoulder isometric-flexion  Hold your arm against your side, with your elbow at a 90 degree angle. Without moving your body, push your fist straight into the wall ahead of you. You should feel your shoulder muscles contract. Repeat contract and relax motion.       Shoulder isometric-external rotation   Hold your arm against your side, with your elbow at a 90 degree angle. Without moving your body, push the back of your hand into the wall. You should feel your shoulder muscles contract. Repeat contract and relax motion.       Shoulder isometric-extension  Hold your arm against your side, with your elbow at a 90 degree angle. Without moving your body, push your arm back into the wall. You should feel your shoulder muscles contract. Repeat contract and relax motion.          Shoulder isometric-abduction   Hold your arm against your side, with your elbow at a 90 degree angle. Without moving your body, push your arm into the wall. You should feel your shoulder muscles contract. Repeat contract and relax motion.       Shoulder isometric-internal rotation   Hold your arm against your side, with your elbow at a 90 degree angle. Without actually moving your arm, push your hand into the wall. You should feel your shoulder muscles contract. Repeat contract and relax motion.

## 2017-07-05 NOTE — Therapy (Signed)
[place], [place], [address] Phone: [phone]   Fax:  [phone]  Occupational Therapy Treatment  Patient Details  Name: Arthur Aguilar MRN: [id number] Date of Birth: 1961/03/30 Referring Provider: Dr. [name]  Encounter Date: 07/05/2017      OT End of Session - 07/05/17 0952    Visit Number 5   Number of Visits 16   Date for OT Re-Evaluation 08/19/17  8/16   Authorization Type [employer]   OT Start Time 0901   OT Stop Time 0946   OT Time Calculation (min) 45 min   Activity Tolerance Patient tolerated treatment well   Behavior During Therapy [facility] for tasks assessed/performed      Past Medical History:  Diagnosis Date  . Arthritis   . GERD (gastroesophageal reflux disease)   . History of kidney stones   . Hypercholesteremia   . Scoliosis     Past Surgical History:  Procedure Laterality Date  . CARPAL TUNNEL RELEASE Right 04/02/2015   Procedure: RIGHT CARPAL TUNNEL RELEASE;  Surgeon: [name], MD;  Location: [facility];  Service: Orthopedics;  Laterality: Right;  . dental bone implant     bottom - right jaw.  Marland Kitchen HIP SURGERY    . left hip replacement    . SHOULDER OPEN ROTATOR CUFF REPAIR Left 05/30/2017   Procedure: ROTATOR CUFF REPAIR SHOULDER OPEN;  Surgeon: [name], MD;  Location: [facility];  Service: Orthopedics;  Laterality: Left;    There were no vitals filed for this visit.      Subjective Assessment - 07/05/17 0925    Subjective  S: I haven't been having to take any pain medication lately.   Currently in Pain? No/denies            [facility] OT Assessment - 07/05/17 0926      Assessment   Diagnosis S/P Open Left Rotator Cuff Repair     Precautions   Precautions Shoulder   Type of Shoulder Precautions Per MD:  PROM AS TOLERATED NOW UP TO 6 WEEKS POST OP (07/11/17), progress as tolerated 07/12/17              OT Treatments/Exercises (OP) - 07/05/17 0926      Exercises   Exercises Shoulder     Shoulder Exercises: Supine   Protraction PROM;10 reps   Horizontal ABduction PROM;10 reps   External Rotation PROM;10 reps   Internal Rotation PROM;10 reps   Flexion PROM;10 reps   ABduction PROM;10 reps     Shoulder Exercises: Seated   Elevation AROM;15 reps   Extension AROM;15 reps   Row AROM;15 reps     Shoulder Exercises: Therapy Ball   Flexion 20 reps   ABduction 20 reps     Shoulder Exercises: ROM/Strengthening   Thumb Tacks 1 min low arms   Prot/Ret//Elev/Dep 1 min low arms     Shoulder Exercises: Isometric Strengthening   Flexion Other (comment)  Against wall 3X15"   Extension Other (comment)  Against wall 3X15"   External Rotation Other (comment)  Against wall 3X15"   Internal Rotation Other (comment)  Against wall 3X15"   ABduction Other (comment)  Against wall 3X15"   ADduction Other (comment)  Against wall 3X15"     Manual Therapy   Manual Therapy Myofascial release   Manual therapy comments completed seperately from all other interventions   Myofascial Release myofascial release and manual stretching to left upper arm, scapular, and  shoulder region to decrease pain and restrictions and improve pain free mobility in left shoulder region.  Scar massage to healed surgical scar.                 OT Education - 07/05/17 [phone]    Education provided Yes   Education Details provided pt with handout for isometric strengthening   Person(s) Educated Patient   Methods Explanation;Demonstration;Verbal cues;Handout   Comprehension Verbalized understanding;Returned demonstration          OT Short Term Goals - 06/25/17 1322      OT SHORT TERM GOAL #1   Title Patient will be educated on HEP for improved shoulder mobility.   Time 4   Period Weeks   Status On-going     OT SHORT TERM GOAL #2   Title Patient will improve left shoulder P/ROM to [facility] in order to don and doff shirts easier.    Time 4   Period Weeks    Status On-going     OT SHORT TERM GOAL #3   Title Patient will improve left shoulder strength to 3/5 for improved ability to lift parts at work.    Time 4   Period Weeks   Status On-going     OT SHORT TERM GOAL #4   Title Patient will decrease pain level to 3/10 or better in left shoulder when using his computer.    Time 4   Period Weeks   Status On-going     OT SHORT TERM GOAL #5   Title Patient will improve left shoulder fascial restrictions to min-mod for greater mobility needed for ADL completion.    Time 4   Period Weeks   Status On-going           OT Long Term Goals - 06/25/17 1323      OT LONG TERM GOAL #1   Title Patient will return to prior level of function using left arm normally with all other ADLs, work, leisure activities.    Time 8   Period Weeks   Status On-going     OT LONG TERM GOAL #2   Title Patient will have WNL A/ROM in left shoulder needed to reach overhead and to steer his motorcycle.   Time 8   Period Weeks   Status On-going     OT LONG TERM GOAL #3   Title Patient will have 5/5 strength in his left shoulder for improved ability to lift his kayak onto his truck.    Time 8   Period Weeks   Status On-going     OT LONG TERM GOAL #4   Title Patient will have 2/10 or better pain in his left shoulder while riding his motorcycle.   Time 8   Period Weeks   Status On-going     OT LONG TERM GOAL #5   Title Patient will have trace fascial and scar restrictions in his left shoulder region.    Time 8   Period Weeks   Status On-going               Plan - 07/05/17 8413    Clinical Impression Statement A: Pt completed scapular A/ROM, therapy ball stretches and low level thumb tacks with occasional VC needed for form and technique. Pt increased isometric strengthening time. Pt requested appointment changes for next week due to work schedule. Pulleys not completed due to time constraint.   Plan P: Follow up on Dr. appt from 07/10/17 and HEP  isometrics.  Add pulleys for improved P/ROM.      Patient will benefit from skilled therapeutic intervention in order to improve the following deficits and impairments:  Decreased range of motion, Decreased knowledge of precautions, Decreased strength, Decreased scar mobility, Decreased skin integrity, Increased fascial restricitons, Increased muscle spasms, Impaired UE functional use, Pain  Visit Diagnosis: Stiffness of left shoulder, not elsewhere classified  Acute pain of left shoulder  Other symptoms and signs involving the musculoskeletal system    Problem List Patient Active Problem List   Diagnosis Date Noted  . Complete tear of left rotator cuff   . Carpal tunnel syndrome of right wrist   . Pain in left hip 03/23/2014  . Status post hip replacement 03/23/2014  . S/P hip replacement 09/18/2013  . Right Achilles tendinitis 12/27/2011  . History of total hip arthroplasty 09/05/2011  . Achilles bursitis or tendinitis 08/17/2011  . HIP, ARTHRITIS, DEGEN./OSTEO 11/30/2009  . BACK PAIN 11/30/2009    Luther Hearing, OT Student [phone] 07/05/2017, 10:00 AM  [place], [place], [address] Phone: [phone]   Fax:  [phone]  Name: KAVIAN PETERS MRN: [id number] Date of Birth: 08/30/1961   Note reviewed by clinical instructor and accurately reflects treatment session.    Ailene Ravel, OTR/L,CBIS  [phone]

## 2017-07-09 ENCOUNTER — Ambulatory Visit (HOSPITAL_COMMUNITY): Payer: Managed Care, Other (non HMO)

## 2017-07-10 ENCOUNTER — Ambulatory Visit (INDEPENDENT_AMBULATORY_CARE_PROVIDER_SITE_OTHER): Payer: Managed Care, Other (non HMO) | Admitting: Orthopedic Surgery

## 2017-07-10 DIAGNOSIS — Z4889 Encounter for other specified surgical aftercare: Secondary | ICD-10-CM

## 2017-07-10 DIAGNOSIS — Z9889 Other specified postprocedural states: Secondary | ICD-10-CM

## 2017-07-10 MED ORDER — IBUPROFEN 800 MG PO TABS: 800.0000 mg | ORAL_TABLET | Freq: Three times a day (TID) | ORAL | 1 refills | Status: DC | PRN

## 2017-07-10 MED ORDER — HYDROCODONE-ACETAMINOPHEN 5-325 MG PO TABS: 1.0000 | ORAL_TABLET | Freq: Four times a day (QID) | ORAL | 0 refills | Status: DC | PRN

## 2017-07-10 MED ORDER — METHOCARBAMOL 750 MG PO TABS: 750.0000 mg | ORAL_TABLET | Freq: Four times a day (QID) | ORAL | 1 refills | Status: DC | PRN

## 2017-07-10 NOTE — Patient Instructions (Signed)
Continue therapy   Use ibuprofen for pain 1st , if that doesn't relieve the pain then you can use the hydrocodone

## 2017-07-10 NOTE — Progress Notes (Signed)
Postop visit  S/p  left rotator cuff repair June 27, POD # 41  Rogers progressing well. In the office his external rotation was 45 his passive flexion was 120, his wound is healed.  He will continue therapy  Will use ibuprofen and hydrocodone for pain  Return 6 weeks Encounter Diagnoses  Name Primary?  Marland Kitchen Aftercare following surgery Yes  . S/P left rotator cuff repair

## 2017-07-10 NOTE — Addendum Note (Signed)
Addended by: Christia Reading on: 07/10/2017 09:32 AM   Modules accepted: Orders

## 2017-07-11 ENCOUNTER — Ambulatory Visit (HOSPITAL_COMMUNITY): Payer: Managed Care, Other (non HMO)

## 2017-07-11 ENCOUNTER — Encounter (HOSPITAL_COMMUNITY): Payer: Self-pay

## 2017-07-11 DIAGNOSIS — M25612 Stiffness of left shoulder, not elsewhere classified: Secondary | ICD-10-CM

## 2017-07-11 DIAGNOSIS — M25512 Pain in left shoulder: Secondary | ICD-10-CM

## 2017-07-11 DIAGNOSIS — R29898 Other symptoms and signs involving the musculoskeletal system: Secondary | ICD-10-CM

## 2017-07-11 NOTE — Therapy (Signed)
[place], [place], [address] Phone: [phone]   Fax:  [phone]  Occupational Therapy Treatment  Patient Details  Name: Arthur Aguilar MRN: [id number] Date of Birth: [birth year] Referring Provider: Dr. [name]  Encounter Date: 07/11/2017      OT End of Session - 07/11/17 0852    Visit Number 6   Number of Visits 16   Date for OT Re-Evaluation 08/19/17  8/16   Authorization Type Aet[employer]   OT Start Time [phone]   OT Stop Time 0857   OT Time Calculation (min) 41 min   Activity Tolerance Patient tolerated treatment well   Behavior During Therapy [facility] for tasks assessed/performed      Past Medical History:  Diagnosis Date  . Arthritis   . GERD (gastroesophageal reflux disease)   . History of kidney stones   . Hypercholesteremia   . Scoliosis     Past Surgical History:  Procedure Laterality Date  . CARPAL TUNNEL RELEASE Right 04/02/2015   Procedure: RIGHT CARPAL TUNNEL RELEASE;  Surgeon: [name], MD;  Location: [facility];  Service: Orthopedics;  Laterality: Right;  . dental bone implant     bottom - right jaw.  Marland Kitchen HIP SURGERY    . left hip replacement    . SHOULDER OPEN ROTATOR CUFF REPAIR Left 05/30/2017   Procedure: ROTATOR CUFF REPAIR SHOULDER OPEN;  Surgeon: [name], MD;  Location: [facility];  Service: Orthopedics;  Laterality: Left;    There were no vitals filed for this visit.      Subjective Assessment - 07/11/17 0835    Subjective  S: It doesn't hurt anymore if I'm not moving it.   Currently in Pain? No/denies            [facility] OT Assessment - 07/11/17 0836      Assessment   Diagnosis S/P Open Left Rotator Cuff Repair     Precautions   Precautions Shoulder   Type of Shoulder Precautions Per MD:  PROM AS TOLERATED NOW UP TO 6 WEEKS POST OP (07/11/17), progress as tolerated 07/12/17                   OT Treatments/Exercises (OP) - 07/11/17 0836      Exercises   Exercises Shoulder     Shoulder Exercises: Supine   Protraction PROM;10 reps   Horizontal ABduction PROM;10 reps   External Rotation PROM;10 reps   Internal Rotation PROM;10 reps   Flexion PROM;10 reps   ABduction PROM;10 reps     Shoulder Exercises: Seated   Elevation AROM;15 reps   Extension AROM;15 reps   Row AROM;15 reps     Shoulder Exercises: Pulleys   Flexion 2 minutes   ABduction 2 minutes     Shoulder Exercises: Therapy Ball   Flexion 20 reps   ABduction 20 reps     Shoulder Exercises: ROM/Strengthening   Thumb Tacks 1 min low arms   Prot/Ret//Elev/Dep 1 min low arms     Shoulder Exercises: Isometric Strengthening   Flexion Other (comment)  against wall 5X15"   Extension Other (comment)  against wall 5X15"   External Rotation Other (comment)  against wall 5X15"   Internal Rotation Other (comment)  against wall 5X15"   ABduction Other (comment)  against wall 5X15"   ADduction Other (comment)  against wall 5X15"     Manual Therapy   Manual Therapy Myofascial release   Manual therapy comments  completed seperately from all other interventions   Myofascial Release myofascial release and manual stretching to left upper arm, scapular, and shoulder region to decrease pain and restrictions and improve pain free mobility in left shoulder region.  Scar massage to healed surgical scar.                 OT Education - 07/11/17 [phone]    Education provided No          OT Short Term Goals - 06/25/17 1322      OT SHORT TERM GOAL #1   Title Patient will be educated on HEP for improved shoulder mobility.   Time 4   Period Weeks   Status On-going     OT SHORT TERM GOAL #2   Title Patient will improve left shoulder P/ROM to [facility] in order to don and doff shirts easier.    Time 4   Period Weeks   Status On-going     OT SHORT TERM GOAL #3   Title Patient will improve left shoulder strength to 3/5 for improved ability to lift parts at work.     Time 4   Period Weeks   Status On-going     OT SHORT TERM GOAL #4   Title Patient will decrease pain level to 3/10 or better in left shoulder when using his computer.    Time 4   Period Weeks   Status On-going     OT SHORT TERM GOAL #5   Title Patient will improve left shoulder fascial restrictions to min-mod for greater mobility needed for ADL completion.    Time 4   Period Weeks   Status On-going           OT Long Term Goals - 06/25/17 1323      OT LONG TERM GOAL #1   Title Patient will return to prior level of function using left arm normally with all other ADLs, work, leisure activities.    Time 8   Period Weeks   Status On-going     OT LONG TERM GOAL #2   Title Patient will have WNL A/ROM in left shoulder needed to reach overhead and to steer his motorcycle.   Time 8   Period Weeks   Status On-going     OT LONG TERM GOAL #3   Title Patient will have 5/5 strength in his left shoulder for improved ability to lift his kayak onto his truck.    Time 8   Period Weeks   Status On-going     OT LONG TERM GOAL #4   Title Patient will have 2/10 or better pain in his left shoulder while riding his motorcycle.   Time 8   Period Weeks   Status On-going     OT LONG TERM GOAL #5   Title Patient will have trace fascial and scar restrictions in his left shoulder region.    Time 8   Period Weeks   Status On-going               Plan - 07/11/17 0865    Clinical Impression Statement A: Pt completed scpaular A/ROM, therapy ball stretches and isometrics. Occasional VC needed for form and technique. Added pulleys for shoulder flexion and abduction. Pt presented with significantly increased ROM and pt states he receievd a good report as far as progress from yesterday's doctor's appointment.   Plan P: Progress pt as tolerated. Attempt AA/ROM shoulder exercises.      Patient will  benefit from skilled therapeutic intervention in order to improve the following deficits and  impairments:  Decreased range of motion, Decreased knowledge of precautions, Decreased strength, Decreased scar mobility, Decreased skin integrity, Increased fascial restricitons, Increased muscle spasms, Impaired UE functional use, Pain  Visit Diagnosis: Stiffness of left shoulder, not elsewhere classified  Acute pain of left shoulder  Other symptoms and signs involving the musculoskeletal system    Problem List Patient Active Problem List   Diagnosis Date Noted  . Complete tear of left rotator cuff   . Carpal tunnel syndrome of right wrist   . Pain in left hip 03/23/2014  . Status post hip replacement 03/23/2014  . S/P hip replacement 09/18/2013  . Right Achilles tendinitis 12/27/2011  . History of total hip arthroplasty 09/05/2011  . Achilles bursitis or tendinitis 08/17/2011  . HIP, ARTHRITIS, DEGEN./OSTEO 11/30/2009  . BACK PAIN 11/30/2009    Luther Hearing, OT Student [phone] 07/11/2017, 9:11 AM  [place], [place], [address] Phone: [phone]   Fax:  [phone]  Name: JOELL USMAN MRN: [id number] Date of Birth: [birth year]   Note reviewed by clinical instructor and accurately reflects treatment session.   Ailene Ravel, OTR/L,CBIS  [phone]

## 2017-07-12 ENCOUNTER — Ambulatory Visit (HOSPITAL_COMMUNITY): Payer: Managed Care, Other (non HMO)

## 2017-07-12 ENCOUNTER — Encounter (HOSPITAL_COMMUNITY): Payer: Self-pay

## 2017-07-12 DIAGNOSIS — R29898 Other symptoms and signs involving the musculoskeletal system: Secondary | ICD-10-CM

## 2017-07-12 DIAGNOSIS — M25512 Pain in left shoulder: Secondary | ICD-10-CM

## 2017-07-12 DIAGNOSIS — M25612 Stiffness of left shoulder, not elsewhere classified: Secondary | ICD-10-CM

## 2017-07-12 NOTE — Patient Instructions (Signed)
Perform each exercise ____12-15____ reps. 2-3x days.   Protraction - STANDING  Start by holding a wand or cane at chest height.  Next, slowly push the wand outwards in front of your body so that your elbows become fully straightened. Then, return to the original position.     Shoulder FLEXION - STANDING - PALMS UP  In the standing position, hold a wand/cane with both arms, palms up on both sides. Raise up the wand/cane allowing your unaffected arm to perform most of the effort. Your affected arm should be partially relaxed.      Internal/External ROTATION - STANDING  In the standing position, hold a wand/cane with both hands keeping your elbows bent. Move your arms and wand/cane to one side.  Your affected arm should be partially relaxed while your unaffected arm performs most of the effort.       Shoulder ABDUCTION - STANDING  While holding a wand/cane palm face up on the injured side and palm face down on the uninjured side, slowly raise up your injured arm to the side.           Horizontal Abduction/Adduction      Straight arms holding cane at shoulder height, bring cane to right, center, left. Repeat starting to left.   Copyright  VHI. All rights reserved.

## 2017-07-12 NOTE — Therapy (Signed)
[place], [place], [address] Phone: [phone]   Fax:  [phone]  Occupational Therapy Treatment  Patient Details  Name: Arthur Aguilar MRN: [id number] Date of Birth: Jun 13, 1961 Referring Provider: Dr. [name]  Encounter Date: 07/12/2017      OT End of Session - 07/12/17 0934    Visit Number 7   Number of Visits 16   Date for OT Re-Evaluation 08/19/17  8/16   Authorization Type [employer]et[employer]   OT Start Time [phone]   OT Stop Time 0900   OT Time Calculation (Arthur) 43 Arthur   Activity Tolerance Patient tolerated treatment well   Behavior During Therapy [facility] for tasks assessed/performed      Past Medical History:  Diagnosis Date  . Arthritis   . GERD (gastroesophageal reflux disease)   . History of kidney stones   . Hypercholesteremia   . Scoliosis     Past Surgical History:  Procedure Laterality Date  . CARPAL TUNNEL RELEASE Right 04/02/2015   Procedure: RIGHT CARPAL TUNNEL RELEASE;  Surgeon: [name], MD;  Location: [facility];  Service: Orthopedics;  Laterality: Right;  . dental bone implant     bottom - right jaw.  Marland Kitchen HIP SURGERY    . left hip replacement    . SHOULDER OPEN ROTATOR CUFF REPAIR Left 05/30/2017   Procedure: ROTATOR CUFF REPAIR SHOULDER OPEN;  Surgeon: [name], MD;  Location: [facility];  Service: Orthopedics;  Laterality: Left;    There were no vitals filed for this visit.      Subjective Assessment - 07/12/17 0832    Subjective  S: I'm just a little sore from yesterday but it's nothing bad.   Currently in Pain? Yes   Pain Score 3    Pain Location Shoulder   Pain Orientation Left   Pain Descriptors / Indicators Sore   Pain Type Acute pain   Pain Radiating Towards N/A   Pain Onset 1 to 4 weeks ago   Pain Frequency Intermittent   Aggravating Factors  movement and stretching   Pain Relieving Factors rest   Effect of Pain on Daily Activities moderate   Multiple  Pain Sites No            [facility] OT Assessment - 07/12/17 0833      Assessment   Diagnosis S/P Open Left Rotator Cuff Repair     Precautions   Precautions Shoulder   Type of Shoulder Precautions Per MD:  PROM AS TOLERATED NOW UP TO 6 WEEKS POST OP (07/11/17), progress as tolerated 07/12/17                   OT Treatments/Exercises (OP) - 07/12/17 0833      Exercises   Exercises Shoulder     Shoulder Exercises: Supine   Protraction PROM;5 reps;AAROM;12 reps   Horizontal ABduction PROM;5 reps;AAROM;12 reps   External Rotation PROM;5 reps;AAROM;12 reps   Internal Rotation PROM;5 reps;AAROM;12 reps   Flexion PROM;5 reps;AAROM;12 reps   ABduction PROM;5 reps;AAROM;12 reps     Shoulder Exercises: Standing   Protraction AAROM;12 reps   Horizontal ABduction AAROM;12 reps   External Rotation AAROM;12 reps   Internal Rotation AAROM;12 reps   Flexion AAROM;12 reps   ABduction AAROM;12 reps     Shoulder Exercises: Pulleys   Flexion 2 minutes   ABduction 2 minutes     Shoulder Exercises: Therapy Ball   Right/Left 5 reps  Shoulder Exercises: ROM/Strengthening   Wall Wash 1'   Proximal Shoulder Strengthening, Supine 12X no rest breaks   Proximal Shoulder Strengthening, Seated 12X no rest breaks     Shoulder Exercises: Stretch   Wall Stretch - Flexion 3 reps;10 seconds   Wall Stretch - ABduction 3 reps;10 seconds     Manual Therapy   Manual Therapy Myofascial release   Manual therapy comments completed seperately from all other interventions   Myofascial Release myofascial release and manual stretching to left upper arm, scapular, and shoulder region to decrease pain and restrictions and improve pain free mobility in left shoulder region.  Scar massage to healed surgical scar.                 OT Education - 07/12/17 0854    Education provided Yes   Education Details AA/ROM exercises. patient was instructed to stop table slides.   Person(s) Educated  Patient   Methods Explanation;Demonstration;Handout;Verbal cues   Comprehension Verbalized understanding;Returned demonstration          OT Short Term Goals - 06/25/17 1322      OT SHORT TERM GOAL #1   Title Patient will be educated on HEP for improved shoulder mobility.   Time 4   Period Weeks   Status On-going     OT SHORT TERM GOAL #2   Title Patient will improve left shoulder P/ROM to [facility] in order to don and doff shirts easier.    Time 4   Period Weeks   Status On-going     OT SHORT TERM GOAL #3   Title Patient will improve left shoulder strength to 3/5 for improved ability to lift parts at work.    Time 4   Period Weeks   Status On-going     OT SHORT TERM GOAL #4   Title Patient will decrease pain level to 3/10 or better in left shoulder when using his computer.    Time 4   Period Weeks   Status On-going     OT SHORT TERM GOAL #5   Title Patient will improve left shoulder fascial restrictions to Arthur-mod for greater mobility needed for ADL completion.    Time 4   Period Weeks   Status On-going           OT Long Term Goals - 06/25/17 1323      OT LONG TERM GOAL #1   Title Patient will return to prior level of function using left arm normally with all other ADLs, work, leisure activities.    Time 8   Period Weeks   Status On-going     OT LONG TERM GOAL #2   Title Patient will have WNL A/ROM in left shoulder needed to reach overhead and to steer his motorcycle.   Time 8   Period Weeks   Status On-going     OT LONG TERM GOAL #3   Title Patient will have 5/5 strength in his left shoulder for improved ability to lift his kayak onto his truck.    Time 8   Period Weeks   Status On-going     OT LONG TERM GOAL #4   Title Patient will have 2/10 or better pain in his left shoulder while riding his motorcycle.   Time 8   Period Weeks   Status On-going     OT LONG TERM GOAL #5   Title Patient will have trace fascial and scar restrictions in his left  shoulder region.  Time 8   Period Weeks   Status On-going               Plan - 07/12/17 0935    Clinical Impression Statement A: Ptogressed patient to AA/ROM supine and standing, added proximal shoulder strength, therapy ball circles and wall wash. patient did well with all tasks with VC for form and technique. Upgraded HEP and discussed with patient. Pt continues to have some limitations in P/ROM although patient's range is functional.   Plan P: follow up on upgraded HEP. Continue with AA/ROM standing. Attempt A/ROM supine.   OT Home Exercise Plan 06/20/17:  towel slides. 8/9: Stop table slides and begin AA/ROM standing.   Consulted and Agree with Plan of Care Patient      Patient will benefit from skilled therapeutic intervention in order to improve the following deficits and impairments:  Decreased range of motion, Decreased knowledge of precautions, Decreased strength, Decreased scar mobility, Decreased skin integrity, Increased fascial restricitons, Increased muscle spasms, Impaired UE functional use, Pain  Visit Diagnosis: Stiffness of left shoulder, not elsewhere classified  Acute pain of left shoulder  Other symptoms and signs involving the musculoskeletal system    Problem List Patient Active Problem List   Diagnosis Date Noted  . Complete tear of left rotator cuff   . Carpal tunnel syndrome of right wrist   . Pain in left hip 03/23/2014  . Status post hip replacement 03/23/2014  . S/P hip replacement 09/18/2013  . Right Achilles tendinitis 12/27/2011  . History of total hip arthroplasty 09/05/2011  . Achilles bursitis or tendinitis 08/17/2011  . HIP, ARTHRITIS, DEGEN./OSTEO 11/30/2009  . BACK PAIN 11/30/2009   Ailene Ravel, OTR/L,CBIS  [phone]  07/12/2017, 9:39 AM  [place] [address] [place], [place], [address] Phone: [phone]   Fax:  [phone]  Name: Arthur Aguilar MRN: [id number] Date  of Birth: Jan 13, 1961

## 2017-07-18 ENCOUNTER — Ambulatory Visit (HOSPITAL_COMMUNITY): Payer: Managed Care, Other (non HMO)

## 2017-07-18 DIAGNOSIS — R29898 Other symptoms and signs involving the musculoskeletal system: Secondary | ICD-10-CM

## 2017-07-18 DIAGNOSIS — M25612 Stiffness of left shoulder, not elsewhere classified: Secondary | ICD-10-CM | POA: Diagnosis not present

## 2017-07-18 DIAGNOSIS — M25512 Pain in left shoulder: Secondary | ICD-10-CM

## 2017-07-18 NOTE — Therapy (Signed)
[place], [place], [address] Phone: [phone]   Fax:  [phone]  Occupational Therapy Treatment  Patient Details  Name: Arthur Aguilar MRN: [id number] Date of Birth: Apr 18, 1961 Referring Provider: Dr. [name]  Encounter Date: 07/18/2017      OT End of Session - 07/18/17 0850    Visit Number 8   Number of Visits 16   Date for OT Re-Evaluation 08/19/17   Authorization Type Aetna   OT Start Time 0820   OT Stop Time 0900   OT Time Calculation (min) 40 min   Activity Tolerance Patient tolerated treatment well   Behavior During Therapy [facility] for tasks assessed/performed      Past Medical History:  Diagnosis Date  . Arthritis   . GERD (gastroesophageal reflux disease)   . History of kidney stones   . Hypercholesteremia   . Scoliosis     Past Surgical History:  Procedure Laterality Date  . CARPAL TUNNEL RELEASE Right 04/02/2015   Procedure: RIGHT CARPAL TUNNEL RELEASE;  Surgeon: [name], MD;  Location: [facility];  Service: Orthopedics;  Laterality: Right;  . dental bone implant     bottom - right jaw.  Marland Kitchen HIP SURGERY    . left hip replacement    . SHOULDER OPEN ROTATOR CUFF REPAIR Left 05/30/2017   Procedure: ROTATOR CUFF REPAIR SHOULDER OPEN;  Surgeon: [name], MD;  Location: [facility];  Service: Orthopedics;  Laterality: Left;    There were no vitals filed for this visit.          [facility] OT Assessment - 07/18/17 0824      Assessment   Diagnosis S/P Open Left Rotator Cuff Repair     Precautions   Precautions Shoulder   Type of Shoulder Precautions Per MD:  PROM AS TOLERATED NOW UP TO 6 WEEKS POST OP (07/11/17), progress as tolerated 07/12/17      AROM   Overall AROM Comments Assessed supine. IR/er adducted. A/ROM not assessed prior to this session.    AROM Assessment Site Shoulder   Right/Left Shoulder Left   Left Shoulder Flexion 135 Degrees   Left Shoulder ABduction  180 Degrees   Left Shoulder Internal Rotation 90 Degrees   Left Shoulder External Rotation 80 Degrees     PROM   Overall PROM Comments assessed in supine, external and internal rotation with shoulder adducted   PROM Assessment Site Shoulder   Right/Left Shoulder Left   Left Shoulder Flexion 140 Degrees  previous: 86   Left Shoulder ABduction 180 Degrees  previous: 70   Left Shoulder Internal Rotation 90 Degrees  previous: 76   Left Shoulder External Rotation 75 Degrees  previous: 40     Strength   Overall Strength Comments Assessed standing. IR/er adducted. Strength not assessed prior to this session.   Strength Assessment Site Shoulder   Right/Left Shoulder Left   Left Shoulder Flexion 3/5   Left Shoulder ABduction 3/5   Left Shoulder Internal Rotation 3/5   Left Shoulder External Rotation 3/5                  OT Treatments/Exercises (OP) - 07/18/17 8416      Exercises   Exercises Shoulder     Shoulder Exercises: Supine   Protraction PROM;5 reps;AROM;10 reps   Horizontal ABduction PROM;5 reps;AROM;10 reps   External Rotation PROM;5 reps;AROM;10 reps   Internal Rotation PROM;5 reps;AROM;10 reps   Flexion PROM;5 reps;AROM;10 reps  ABduction PROM;5 reps;AROM;10 reps     Shoulder Exercises: Standing   Protraction AAROM;12 reps   Horizontal ABduction AAROM;12 reps   External Rotation AAROM;12 reps   Internal Rotation AAROM;12 reps   Flexion AAROM;12 reps   ABduction AAROM;12 reps   Extension Theraband;12 reps   Theraband Level (Shoulder Extension) Level 2 (Red)   Row Theraband;12 reps   Theraband Level (Shoulder Row) Level 2 (Red)   Retraction Theraband;12 reps   Theraband Level (Shoulder Retraction) Level 2 (Red)     Shoulder Exercises: ROM/Strengthening   UBE (Upper Arm Bike) Level 1 2' reverse 2' forward   Wall Wash 1'   Proximal Shoulder Strengthening, Supine 12X no rest breaks   Proximal Shoulder Strengthening, Seated 12X no rest breaks      Manual Therapy   Manual Therapy Myofascial release   Manual therapy comments completed seperately from all other interventions   Myofascial Release myofascial release and manual stretching to left upper arm, scapular, and shoulder region to decrease pain and restrictions and improve pain free mobility in left shoulder region.  Scar massage to healed surgical scar.                   OT Short Term Goals - 07/18/17 0847      OT SHORT TERM GOAL #1   Title Patient will be educated on HEP for improved shoulder mobility.   Time 4   Period Weeks   Status Achieved     OT SHORT TERM GOAL #2   Title Patient will improve left shoulder P/ROM to [facility] in order to don and doff shirts easier.    Time 4   Period Weeks   Status Achieved     OT SHORT TERM GOAL #3   Title Patient will improve left shoulder strength to 3/5 for improved ability to lift parts at work.    Time 4   Period Weeks   Status Achieved     OT SHORT TERM GOAL #4   Title Patient will decrease pain level to 3/10 or better in left shoulder when using his computer.    Time 4   Period Weeks   Status Achieved     OT SHORT TERM GOAL #5   Title Patient will improve left shoulder fascial restrictions to min-mod for greater mobility needed for ADL completion.    Time 4   Period Weeks   Status Achieved           OT Long Term Goals - 06/25/17 1323      OT LONG TERM GOAL #1   Title Patient will return to prior level of function using left arm normally with all other ADLs, work, leisure activities.    Time 8   Period Weeks   Status On-going     OT LONG TERM GOAL #2   Title Patient will have WNL A/ROM in left shoulder needed to reach overhead and to steer his motorcycle.   Time 8   Period Weeks   Status On-going     OT LONG TERM GOAL #3   Title Patient will have 5/5 strength in his left shoulder for improved ability to lift his kayak onto his truck.    Time 8   Period Weeks   Status On-going     OT LONG TERM  GOAL #4   Title Patient will have 2/10 or better pain in his left shoulder while riding his motorcycle.   Time 8   Period Weeks  Status On-going     OT LONG TERM GOAL #5   Title Patient will have trace fascial and scar restrictions in his left shoulder region.    Time 8   Period Weeks   Status On-going               Plan - 07/18/17 0859    Clinical Impression Statement A: Mini reassessment completed this date. patient has made great progress toward  therapy goals. All short term goals have been met at this time. Progressed to A/ROM supine. Some pain and difficulty noted with supine abduction. VC for form and technique when needed during session.   Plan P: Attempt Standing A/ROM. Add X to V arms.      Patient will benefit from skilled therapeutic intervention in order to improve the following deficits and impairments:  Decreased range of motion, Decreased knowledge of precautions, Decreased strength, Decreased scar mobility, Decreased skin integrity, Increased fascial restricitons, Increased muscle spasms, Impaired UE functional use, Pain  Visit Diagnosis: Stiffness of left shoulder, not elsewhere classified  Acute pain of left shoulder  Other symptoms and signs involving the musculoskeletal system    Problem List Patient Active Problem List   Diagnosis Date Noted  . Complete tear of left rotator cuff   . Carpal tunnel syndrome of right wrist   . Pain in left hip 03/23/2014  . Status post hip replacement 03/23/2014  . S/P hip replacement 09/18/2013  . Right Achilles tendinitis 12/27/2011  . History of total hip arthroplasty 09/05/2011  . Achilles bursitis or tendinitis 08/17/2011  . HIP, ARTHRITIS, DEGEN./OSTEO 11/30/2009  . BACK PAIN 11/30/2009   Ailene Ravel, OTR/L,CBIS  [phone]  07/18/2017, 9:06 AM  [place] [address] [place], [place], [address] Phone: [phone]   Fax:  [phone]  Name:  DIMITRIS SHANAHAN MRN: [id number] Date of Birth: 1961/06/30

## 2017-07-20 ENCOUNTER — Ambulatory Visit (HOSPITAL_COMMUNITY): Payer: Managed Care, Other (non HMO)

## 2017-07-20 ENCOUNTER — Encounter (HOSPITAL_COMMUNITY): Payer: Self-pay

## 2017-07-20 DIAGNOSIS — M25612 Stiffness of left shoulder, not elsewhere classified: Secondary | ICD-10-CM | POA: Diagnosis not present

## 2017-07-20 DIAGNOSIS — R29898 Other symptoms and signs involving the musculoskeletal system: Secondary | ICD-10-CM

## 2017-07-20 DIAGNOSIS — M25512 Pain in left shoulder: Secondary | ICD-10-CM

## 2017-07-20 NOTE — Therapy (Signed)
[place], [place], [address] Phone: [phone]   Fax:  [phone]  Occupational Therapy Treatment  Patient Details  Name: Arthur Aguilar MRN: [id number] Date of Birth: Aug 02, 1961 Referring Provider: Dr. [name]  Encounter Date: 07/20/2017      OT End of Session - 07/20/17 0833    Visit Number 9   Number of Visits 16   Date for OT Re-Evaluation 08/19/17   Authorization Type A[employer]   OT Start Time [phone]   OT Stop Time 0855   OT Time Calculation (min) 38 min   Activity Tolerance Patient tolerated treatment well   Behavior During Therapy [facility] for tasks assessed/performed      Past Medical History:  Diagnosis Date  . Arthritis   . GERD (gastroesophageal reflux disease)   . History of kidney stones   . Hypercholesteremia   . Scoliosis     Past Surgical History:  Procedure Laterality Date  . CARPAL TUNNEL RELEASE Right 04/02/2015   Procedure: RIGHT CARPAL TUNNEL RELEASE;  Surgeon: [name], MD;  Location: [facility];  Service: Orthopedics;  Laterality: Right;  . dental bone implant     bottom - right jaw.  Marland Kitchen HIP SURGERY    . left hip replacement    . SHOULDER OPEN ROTATOR CUFF REPAIR Left 05/30/2017   Procedure: ROTATOR CUFF REPAIR SHOULDER OPEN;  Surgeon: [name], MD;  Location: [facility];  Service: Orthopedics;  Laterality: Left;    There were no vitals filed for this visit.      Subjective Assessment - 07/20/17 0830    Subjective  S: I've been really working on my arm at home. It's gotten a lot better.    Currently in Pain? No/denies            [facility] OT Assessment - 07/20/17 0831      Assessment   Diagnosis S/P Open Left Rotator Cuff Repair     Precautions   Precautions Shoulder   Type of Shoulder Precautions Per MD:  PROM AS TOLERATED NOW UP TO 6 WEEKS POST OP (07/11/17), progress as tolerated 07/12/17                   OT Treatments/Exercises (OP) -  07/20/17 0831      Exercises   Exercises Shoulder     Shoulder Exercises: Supine   Protraction PROM;5 reps;AROM;12 reps   Horizontal ABduction PROM;5 reps;AROM;12 reps   External Rotation PROM;5 reps;AROM;12 reps   Internal Rotation PROM;5 reps;AROM;12 reps   Flexion PROM;5 reps;AROM;12 reps   ABduction PROM;5 reps;AROM;12 reps     Shoulder Exercises: Standing   Protraction AROM;12 reps   Horizontal ABduction AROM;12 reps   External Rotation AROM;12 reps   Internal Rotation AROM;12 reps   Flexion AROM;12 reps   ABduction AROM;12 reps   Extension Theraband;12 reps   Theraband Level (Shoulder Extension) Level 2 (Red)   Row Theraband;12 reps   Theraband Level (Shoulder Row) Level 2 (Red)   Retraction Theraband;12 reps   Theraband Level (Shoulder Retraction) Level 2 (Red)     Shoulder Exercises: Therapy Ball   Right/Left 5 reps     Shoulder Exercises: ROM/Strengthening   Wall Wash 1'   Over Head Lace 2'   Proximal Shoulder Strengthening, Supine 15X no rest breaks   Proximal Shoulder Strengthening, Seated 12X no rest breaks  OT Short Term Goals - 07/20/17 9629      OT SHORT TERM GOAL #1   Title Patient will be educated on HEP for improved shoulder mobility.   Time 4   Period Weeks     OT SHORT TERM GOAL #2   Title Patient will improve left shoulder P/ROM to [facility] in order to don and doff shirts easier.    Time 4   Period Weeks     OT SHORT TERM GOAL #3   Title Patient will improve left shoulder strength to 3/5 for improved ability to lift parts at work.    Time 4   Period Weeks     OT SHORT TERM GOAL #4   Title Patient will decrease pain level to 3/10 or better in left shoulder when using his computer.    Time 4   Period Weeks     OT SHORT TERM GOAL #5   Title Patient will improve left shoulder fascial restrictions to min-mod for greater mobility needed for ADL completion.    Time 4   Period Weeks           OT Long Term Goals -  06/25/17 1323      OT LONG TERM GOAL #1   Title Patient will return to prior level of function using left arm normally with all other ADLs, work, leisure activities.    Time 8   Period Weeks   Status On-going     OT LONG TERM GOAL #2   Title Patient will have WNL A/ROM in left shoulder needed to reach overhead and to steer his motorcycle.   Time 8   Period Weeks   Status On-going     OT LONG TERM GOAL #3   Title Patient will have 5/5 strength in his left shoulder for improved ability to lift his kayak onto his truck.    Time 8   Period Weeks   Status On-going     OT LONG TERM GOAL #4   Title Patient will have 2/10 or better pain in his left shoulder while riding his motorcycle.   Time 8   Period Weeks   Status On-going     OT LONG TERM GOAL #5   Title Patient will have trace fascial and scar restrictions in his left shoulder region.    Time 8   Period Weeks   Status On-going               Plan - 07/20/17 5284    Clinical Impression Statement A: Pt was able to complete A/ROM standing while almost reaching 75% range. Attempted X to V arms although was unable to complete due to shoulder weakness. VC for form and technique as needed. No fascial restrictions noted this date.    Plan A: Add A/ROM exercises and scapular theraband to HEP. Complete sidelying A/ROM and shoulder stretches. Myofascial release PRN.      Patient will benefit from skilled therapeutic intervention in order to improve the following deficits and impairments:  Decreased range of motion, Decreased knowledge of precautions, Decreased strength, Decreased scar mobility, Decreased skin integrity, Increased fascial restricitons, Increased muscle spasms, Impaired UE functional use, Pain  Visit Diagnosis: Stiffness of left shoulder, not elsewhere classified  Acute pain of left shoulder  Other symptoms and signs involving the musculoskeletal system    Problem List Patient Active Problem List    Diagnosis Date Noted  . Complete tear of left rotator cuff   . Carpal tunnel syndrome  of right wrist   . Pain in left hip 03/23/2014  . Status post hip replacement 03/23/2014  . S/P hip replacement 09/18/2013  . Right Achilles tendinitis 12/27/2011  . History of total hip arthroplasty 09/05/2011  . Achilles bursitis or tendinitis 08/17/2011  . HIP, ARTHRITIS, DEGEN./OSTEO 11/30/2009  . BACK PAIN 11/30/2009   Ailene Ravel, OTR/L,CBIS  [phone]  07/20/2017, 8:58 AM  [place] [address] [place], [place], [address] Phone: [phone]   Fax:  [phone]  Name: RAFFAEL BUGARIN MRN: [id number] Date of Birth: [birth year]

## 2017-07-25 ENCOUNTER — Ambulatory Visit (HOSPITAL_COMMUNITY): Payer: Managed Care, Other (non HMO)

## 2017-07-25 ENCOUNTER — Encounter (HOSPITAL_COMMUNITY): Payer: Self-pay

## 2017-07-25 DIAGNOSIS — R29898 Other symptoms and signs involving the musculoskeletal system: Secondary | ICD-10-CM

## 2017-07-25 DIAGNOSIS — M25612 Stiffness of left shoulder, not elsewhere classified: Secondary | ICD-10-CM

## 2017-07-25 DIAGNOSIS — M25512 Pain in left shoulder: Secondary | ICD-10-CM

## 2017-07-25 NOTE — Patient Instructions (Signed)
Repeat all exercises 10-15 times, 1-2 times per day.  1) Shoulder Protraction    Begin with elbows by your side, slowly "punch" straight out in front of you keeping arms/elbows straight.      2) Shoulder Flexion  Supine:     Standing:         Begin with arms at your side with thumbs pointed up, slowly raise both arms up and forward towards overhead.               3) Horizontal abduction/adduction  Supine:   Standing:           Begin with arms straight out in front of you, bring out to the side in at "T" shape. Keep arms straight entire time.                 4) Internal & External Rotation    *No band* -Stand with elbows at the side and elbows bent 90 degrees. Move your forearms away from your body, then bring back inward toward the body.     5) Shoulder Abduction  Supine:     Standing:       Lying on your back begin with your arms flat on the table next to your side. Slowly move your arms out to the side so that they go overhead, in a jumping jack or snow angel movement.    (Home) Extension: Isometric / Bilateral Arm Retraction - Sitting   Facing anchor, hold hands and elbow at shoulder height, with elbow bent.  Pull arms back to squeeze shoulder blades together. Repeat 10-15 times. 1-3 times/day.   Copyright  VHI. All rights reserved.   (Home) Retraction: Row - Bilateral (Anchor)   Facing anchor, arms reaching forward, pull hands toward stomach, keeping elbows bent and at your sides and pinching shoulder blades together. Repeat 10-15 times. 1-3 times/day.   Copyright  VHI. All rights reserved.   (Clinic) Extension / Flexion (Assist)   Face anchor, pull arms back, keeping elbow straight, and squeze shoulder blades together. Repeat 10-15 times. 1-3 times/day.   Copyright  VHI. All rights reserved.    

## 2017-07-25 NOTE — Therapy (Signed)
[place], [place], [address] Phone: [phone]   Fax:  [phone]  Occupational Therapy Treatment  Patient Details  Name: Arthur Aguilar MRN: [id number] Date of Birth: 02-Mar-1961 Referring Provider: Dr. [name]  Encounter Date: 07/25/2017      OT End of Session - 07/25/17 0842    Visit Number 10   Number of Visits 16   Date for OT Re-Evaluation 08/19/17   Authorization Type Aetna   OT Start Time 0820   OT Stop Time 0900   OT Time Calculation (min) 40 min   Activity Tolerance Patient tolerated treatment well   Behavior During Therapy [facility] for tasks assessed/performed      Past Medical History:  Diagnosis Date  . Arthritis   . GERD (gastroesophageal reflux disease)   . History of kidney stones   . Hypercholesteremia   . Scoliosis     Past Surgical History:  Procedure Laterality Date  . CARPAL TUNNEL RELEASE Right 04/02/2015   Procedure: RIGHT CARPAL TUNNEL RELEASE;  Surgeon: [name], MD;  Location: [facility];  Service: Orthopedics;  Laterality: Right;  . dental bone implant     bottom - right jaw.  Marland Kitchen HIP SURGERY    . left hip replacement    . SHOULDER OPEN ROTATOR CUFF REPAIR Left 05/30/2017   Procedure: ROTATOR CUFF REPAIR SHOULDER OPEN;  Surgeon: [name], MD;  Location: [facility];  Service: Orthopedics;  Laterality: Left;    There were no vitals filed for this visit.      Subjective Assessment - 07/25/17 0832    Subjective  S: Why is it harder to do the exercises standing up than laying down?   Currently in Pain? No/denies                      OT Treatments/Exercises (OP) - 07/25/17 0834      Exercises   Exercises Shoulder     Shoulder Exercises: Supine   Protraction PROM;5 reps;AROM;12 reps   Horizontal ABduction PROM;5 reps;AROM;12 reps   External Rotation PROM;5 reps;AROM;12 reps   Internal Rotation PROM;5 reps;AROM;12 reps   Flexion PROM;5  reps;AROM;12 reps   ABduction PROM;5 reps;AROM;12 reps     Shoulder Exercises: Sidelying   External Rotation AROM;12 reps   Internal Rotation AROM;12 reps   Other Sidelying Exercises Protraction: 12X; A/ROM   Other Sidelying Exercises Horizontal abduction; 12X; A/ROM     Shoulder Exercises: Standing   Protraction AROM;12 reps   Horizontal ABduction AROM;12 reps   External Rotation AROM;12 reps   Internal Rotation AROM;12 reps   Flexion AROM;12 reps   ABduction AROM;12 reps   Extension Theraband;12 reps   Theraband Level (Shoulder Extension) Level 2 (Red)   Row Theraband;12 reps   Theraband Level (Shoulder Row) Level 2 (Red)   Retraction Theraband;12 reps   Theraband Level (Shoulder Retraction) Level 2 (Red)     Shoulder Exercises: ROM/Strengthening   UBE (Upper Arm Bike) Level 1 2' reverse 2' forward   Over Head Lace 2'   X to V Arms 5X   Proximal Shoulder Strengthening, Supine 15X no rest breaks   Proximal Shoulder Strengthening, Seated 12X no rest breaks                OT Education - 07/25/17 [phone]    Education provided Yes   Education Details A/ROM shoulder exercises and scapular theraband exercises - red   Person(s) Educated  Patient   Methods Explanation;Demonstration;Verbal cues;Handout   Comprehension Returned demonstration;Verbalized understanding          OT Short Term Goals - 07/20/17 0858      OT SHORT TERM GOAL #1   Title Patient will be educated on HEP for improved shoulder mobility.   Time 4   Period Weeks     OT SHORT TERM GOAL #2   Title Patient will improve left shoulder P/ROM to [facility] in order to don and doff shirts easier.    Time 4   Period Weeks     OT SHORT TERM GOAL #3   Title Patient will improve left shoulder strength to 3/5 for improved ability to lift parts at work.    Time 4   Period Weeks     OT SHORT TERM GOAL #4   Title Patient will decrease pain level to 3/10 or better in left shoulder when using his computer.    Time 4    Period Weeks     OT SHORT TERM GOAL #5   Title Patient will improve left shoulder fascial restrictions to min-mod for greater mobility needed for ADL completion.    Time 4   Period Weeks           OT Long Term Goals - 06/25/17 1323      OT LONG TERM GOAL #1   Title Patient will return to prior level of function using left arm normally with all other ADLs, work, leisure activities.    Time 8   Period Weeks   Status On-going     OT LONG TERM GOAL #2   Title Patient will have WNL A/ROM in left shoulder needed to reach overhead and to steer his motorcycle.   Time 8   Period Weeks   Status On-going     OT LONG TERM GOAL #3   Title Patient will have 5/5 strength in his left shoulder for improved ability to lift his kayak onto his truck.    Time 8   Period Weeks   Status On-going     OT LONG TERM GOAL #4   Title Patient will have 2/10 or better pain in his left shoulder while riding his motorcycle.   Time 8   Period Weeks   Status On-going     OT LONG TERM GOAL #5   Title Patient will have trace fascial and scar restrictions in his left shoulder region.    Time 8   Period Weeks   Status On-going               Plan - 07/25/17 0857    Clinical Impression Statement A: Pt was able to complete sidelying exercises with no difficulty although continues to have increased weakness when standing during A/ROM. Patient was given red theraband scapular exercises and A/ROM to complete for HEP. VC for form and technique.    Plan P: Add 1# weight supine and sidelying.       Patient will benefit from skilled therapeutic intervention in order to improve the following deficits and impairments:  Decreased range of motion, Decreased knowledge of precautions, Decreased strength, Decreased scar mobility, Decreased skin integrity, Increased fascial restricitons, Increased muscle spasms, Impaired UE functional use, Pain  Visit Diagnosis: Stiffness of left shoulder, not elsewhere  classified  Acute pain of left shoulder  Other symptoms and signs involving the musculoskeletal system    Problem List Patient Active Problem List   Diagnosis Date Noted  . Complete tear  of left rotator cuff   . Carpal tunnel syndrome of right wrist   . Pain in left hip 03/23/2014  . Status post hip replacement 03/23/2014  . S/P hip replacement 09/18/2013  . Right Achilles tendinitis 12/27/2011  . History of total hip arthroplasty 09/05/2011  . Achilles bursitis or tendinitis 08/17/2011  . HIP, ARTHRITIS, DEGEN./OSTEO 11/30/2009  . BACK PAIN 11/30/2009   Ailene Ravel, OTR/L,CBIS  [phone]  07/25/2017, 9:06 AM  [place] [facility] [address] [place], [place], [address] Phone: [phone]   Fax:  [phone]  Name: Arthur Aguilar MRN: [id number] Date of Birth: [birth year]

## 2017-07-27 ENCOUNTER — Encounter (HOSPITAL_COMMUNITY): Payer: Self-pay | Admitting: Occupational Therapy

## 2017-07-27 ENCOUNTER — Ambulatory Visit (HOSPITAL_COMMUNITY): Payer: Managed Care, Other (non HMO) | Admitting: Occupational Therapy

## 2017-07-27 DIAGNOSIS — M25612 Stiffness of left shoulder, not elsewhere classified: Secondary | ICD-10-CM | POA: Diagnosis not present

## 2017-07-27 DIAGNOSIS — R29898 Other symptoms and signs involving the musculoskeletal system: Secondary | ICD-10-CM

## 2017-07-27 DIAGNOSIS — M25512 Pain in left shoulder: Secondary | ICD-10-CM

## 2017-07-27 NOTE — Therapy (Signed)
[place], [place], [address] Phone: [phone]   Fax:  [phone]  Occupational Therapy Treatment  Patient Details  Name: KEONI RISINGER MRN: [id number] Date of Birth: [birth year] Referring Provider: Dr. [name]  Encounter Date: 07/27/2017      OT End of Session - 07/27/17 0854    Visit Number 11   Number of Visits 16   Date for OT Re-Evaluation 08/19/17   Authorization Type Aetna   OT Start Time [phone]   OT Stop Time 0857   OT Time Calculation (min) 41 min   Activity Tolerance Patient tolerated treatment well   Behavior During Therapy [facility] for tasks assessed/performed      Past Medical History:  Diagnosis Date  . Arthritis   . GERD (gastroesophageal reflux disease)   . History of kidney stones   . Hypercholesteremia   . Scoliosis     Past Surgical History:  Procedure Laterality Date  . CARPAL TUNNEL RELEASE Right 04/02/2015   Procedure: RIGHT CARPAL TUNNEL RELEASE;  Surgeon: [name], MD;  Location: [facility];  Service: Orthopedics;  Laterality: Right;  . dental bone implant     bottom - right jaw.  Marland Kitchen HIP SURGERY    . left hip replacement    . SHOULDER OPEN ROTATOR CUFF REPAIR Left 05/30/2017   Procedure: ROTATOR CUFF REPAIR SHOULDER OPEN;  Surgeon: [name], MD;  Location: [facility];  Service: Orthopedics;  Laterality: Left;    There were no vitals filed for this visit.      Subjective Assessment - 07/27/17 0812    Subjective  S: I didn't do my exercises yesterday, by the time I got done mowing it was too late.    Currently in Pain? No/denies            [facility] OT Assessment - 07/27/17 9485      Assessment   Diagnosis S/P Open Left Rotator Cuff Repair     Precautions   Precautions Shoulder   Type of Shoulder Precautions Per MD:  PROM AS TOLERATED NOW UP TO 6 WEEKS POST OP (07/11/17), progress as tolerated 07/12/17                   OT  Treatments/Exercises (OP) - 07/27/17 0818      Exercises   Exercises Shoulder     Shoulder Exercises: Supine   Protraction PROM;5 reps;Strengthening;12 reps   Protraction Weight (lbs) 1   Horizontal ABduction PROM;5 reps;Strengthening;12 reps   Horizontal ABduction Weight (lbs) 1   External Rotation PROM;5 reps;Strengthening;12 reps   External Rotation Weight (lbs) 1   Internal Rotation PROM;5 reps;Strengthening;12 reps   Internal Rotation Weight (lbs) 1   Flexion PROM;5 reps;Strengthening;12 reps   Shoulder Flexion Weight (lbs) 1   ABduction PROM;5 reps;Strengthening;12 reps   Shoulder ABduction Weight (lbs) 1     Shoulder Exercises: Sidelying   External Rotation Strengthening;12 reps   External Rotation Weight (lbs) 1   Internal Rotation Strengthening;12 reps   Internal Rotation Weight (lbs) 1   Other Sidelying Exercises Protraction: 12X; 1#   Other Sidelying Exercises Horizontal abduction; 12X 1# weight     Shoulder Exercises: Standing   Protraction AROM;12 reps   Horizontal ABduction AROM;12 reps   External Rotation AROM;12 reps   Internal Rotation AROM;12 reps   Flexion AROM;12 reps   ABduction AROM;12 reps   Extension Theraband;12 reps   Theraband Level (Shoulder Extension) Level 2 (Red)  Row Theraband;12 reps   Theraband Level (Shoulder Row) Level 2 (Red)   Retraction Theraband;12 reps   Theraband Level (Shoulder Retraction) Level 2 (Red)     Shoulder Exercises: ROM/Strengthening   UBE (Upper Arm Bike) Level 1 3' reverse 3' forward   Over Head Lace 2'   X to V Arms 10X   Proximal Shoulder Strengthening, Supine 15X, 1#, no rest breaks   Proximal Shoulder Strengthening, Seated 12X no rest breaks   Ball on Wall 1' flexion                  OT Short Term Goals - 07/20/17 8338      OT SHORT TERM GOAL #1   Title Patient will be educated on HEP for improved shoulder mobility.   Time 4   Period Weeks     OT SHORT TERM GOAL #2   Title Patient will  improve left shoulder P/ROM to [facility] in order to don and doff shirts easier.    Time 4   Period Weeks     OT SHORT TERM GOAL #3   Title Patient will improve left shoulder strength to 3/5 for improved ability to lift parts at work.    Time 4   Period Weeks     OT SHORT TERM GOAL #4   Title Patient will decrease pain level to 3/10 or better in left shoulder when using his computer.    Time 4   Period Weeks     OT SHORT TERM GOAL #5   Title Patient will improve left shoulder fascial restrictions to min-mod for greater mobility needed for ADL completion.    Time 4   Period Weeks           OT Long Term Goals - 06/25/17 1323      OT LONG TERM GOAL #1   Title Patient will return to prior level of function using left arm normally with all other ADLs, work, leisure activities.    Time 8   Period Weeks   Status On-going     OT LONG TERM GOAL #2   Title Patient will have WNL A/ROM in left shoulder needed to reach overhead and to steer his motorcycle.   Time 8   Period Weeks   Status On-going     OT LONG TERM GOAL #3   Title Patient will have 5/5 strength in his left shoulder for improved ability to lift his kayak onto his truck.    Time 8   Period Weeks   Status On-going     OT LONG TERM GOAL #4   Title Patient will have 2/10 or better pain in his left shoulder while riding his motorcycle.   Time 8   Period Weeks   Status On-going     OT LONG TERM GOAL #5   Title Patient will have trace fascial and scar restrictions in his left shoulder region.    Time 8   Period Weeks   Status On-going               Plan - 07/27/17 0857    Clinical Impression Statement A: Added 1# weight to supine and sidelying exercises today, added ball on wall in flexion as well. Pt demonstrates good form with exercises, however continues to have weakness with standing A/ROM. Occasional verbal cuing for form and technique with theraband exercises. Pt reports HEP is going well.    Plan P:  Continue with strengthening, add flexion and abduction in  sidelying.    OT Home Exercise Plan 06/20/17:  towel slides. 8/9: Stop table slides and begin AA/ROM standing.   Consulted and Agree with Plan of Care Patient      Patient will benefit from skilled therapeutic intervention in order to improve the following deficits and impairments:  Decreased range of motion, Decreased knowledge of precautions, Decreased strength, Decreased scar mobility, Decreased skin integrity, Increased fascial restricitons, Increased muscle spasms, Impaired UE functional use, Pain  Visit Diagnosis: Stiffness of left shoulder, not elsewhere classified  Acute pain of left shoulder  Other symptoms and signs involving the musculoskeletal system    Problem List Patient Active Problem List   Diagnosis Date Noted  . Complete tear of left rotator cuff   . Carpal tunnel syndrome of right wrist   . Pain in left hip 03/23/2014  . Status post hip replacement 03/23/2014  . S/P hip replacement 09/18/2013  . Right Achilles tendinitis 12/27/2011  . History of total hip arthroplasty 09/05/2011  . Achilles bursitis or tendinitis 08/17/2011  . HIP, ARTHRITIS, DEGEN./OSTEO 11/30/2009  . BACK PAIN 11/30/2009   Guadelupe Sabin, OTR/L  [phone] 07/27/2017, 9:00 AM  [place], [place], [address] Phone: [phone]   Fax:  [phone]  Name: RAHM MINIX MRN: [id number] Date of Birth: [birth year]

## 2017-08-01 ENCOUNTER — Ambulatory Visit (HOSPITAL_COMMUNITY): Payer: Managed Care, Other (non HMO)

## 2017-08-01 ENCOUNTER — Encounter (HOSPITAL_COMMUNITY): Payer: Self-pay

## 2017-08-01 DIAGNOSIS — M25612 Stiffness of left shoulder, not elsewhere classified: Secondary | ICD-10-CM | POA: Diagnosis not present

## 2017-08-01 DIAGNOSIS — R29898 Other symptoms and signs involving the musculoskeletal system: Secondary | ICD-10-CM

## 2017-08-01 DIAGNOSIS — M25512 Pain in left shoulder: Secondary | ICD-10-CM

## 2017-08-01 NOTE — Therapy (Signed)
[place], [place], [address] Phone: [phone]   Fax:  [phone]  Occupational Therapy Treatment  Patient Details  Name: Arthur Aguilar MRN: [id number] Date of Birth: Apr 10, 1961 Referring Provider: Dr. [name]  Encounter Date: 08/01/2017      OT End of Session - 08/01/17 0858    Visit Number 12   Number of Visits 16   Date for OT Re-Evaluation 08/19/17   Authorization Type [employer]   OT Start Time 0820   OT Stop Time 0900   OT Time Calculation (min) 40 min   Activity Tolerance Patient tolerated treatment well   Behavior During Therapy [facility] for tasks assessed/performed      Past Medical History:  Diagnosis Date  . Arthritis   . GERD (gastroesophageal reflux disease)   . History of kidney stones   . Hypercholesteremia   . Scoliosis     Past Surgical History:  Procedure Laterality Date  . CARPAL TUNNEL RELEASE Right 04/02/2015   Procedure: RIGHT CARPAL TUNNEL RELEASE;  Surgeon: [name], MD;  Location: [facility];  Service: Orthopedics;  Laterality: Right;  . dental bone implant     bottom - right jaw.  Marland Kitchen HIP SURGERY    . left hip replacement    . SHOULDER OPEN ROTATOR CUFF REPAIR Left 05/30/2017   Procedure: ROTATOR CUFF REPAIR SHOULDER OPEN;  Surgeon: [name], MD;  Location: [facility];  Service: Orthopedics;  Laterality: Left;    There were no vitals filed for this visit.      Subjective Assessment - 08/01/17 0830    Subjective  S: I didn't get to do my exercises this weekend but I was using my arm a lot riding and doing the lawn.    Currently in Pain? No/denies            [facility] OT Assessment - 08/01/17 0831      Assessment   Diagnosis S/P Open Left Rotator Cuff Repair     Precautions   Precautions Shoulder   Type of Shoulder Precautions Per MD:  PROM AS TOLERATED NOW UP TO 6 WEEKS POST OP (07/11/17), progress as tolerated 07/12/17                   OT  Treatments/Exercises (OP) - 08/01/17 0831      Exercises   Exercises Shoulder     Shoulder Exercises: Supine   Protraction PROM;5 reps;Strengthening;15 reps   Protraction Weight (lbs) 2   Horizontal ABduction PROM;5 reps;Strengthening;15 reps   Horizontal ABduction Weight (lbs) 2   External Rotation PROM;5 reps;Strengthening;15 reps   External Rotation Weight (lbs) 2   Internal Rotation PROM;5 reps;Strengthening;15 reps   Internal Rotation Weight (lbs) 2   Flexion PROM;5 reps;Strengthening;15 reps   Shoulder Flexion Weight (lbs) 2   ABduction PROM;5 reps;Strengthening;15 reps   Shoulder ABduction Weight (lbs) 2     Shoulder Exercises: Prone   Other Prone Exercises Hughston exercises; 10X; 2#     Shoulder Exercises: Sidelying   External Rotation Strengthening;15 reps   External Rotation Weight (lbs) 2   Internal Rotation Strengthening;15 reps   Internal Rotation Weight (lbs) 2   Flexion Strengthening;15 reps   Flexion Weight (lbs) 2   ABduction Strengthening;15 reps   ABduction Weight (lbs) 2   Other Sidelying Exercises Protraction: 15X; 2#   Other Sidelying Exercises Horizontal abduction; 15X 2# weight     Shoulder Exercises: Standing   Protraction Strengthening;12  reps   Protraction Weight (lbs) 2   Horizontal ABduction Strengthening;10 reps   Horizontal ABduction Weight (lbs) 2   Flexion Strengthening;12 reps   Shoulder Flexion Weight (lbs) 2   ABduction Strengthening;12 reps   Shoulder ABduction Weight (lbs) 2     Shoulder Exercises: ROM/Strengthening   UBE (Upper Arm Bike) Level 1 3' reverse 3' forward   Over Head Lace 2'   X to V Arms 15X   Proximal Shoulder Strengthening, Supine 15X, 2#, no rest breaks   Ball on Wall 1' flexion 1' abduction green ball                  OT Short Term Goals - 07/20/17 5638      OT SHORT TERM GOAL #1   Title Patient will be educated on HEP for improved shoulder mobility.   Time 4   Period Weeks     OT SHORT TERM  GOAL #2   Title Patient will improve left shoulder P/ROM to [facility] in order to don and doff shirts easier.    Time 4   Period Weeks     OT SHORT TERM GOAL #3   Title Patient will improve left shoulder strength to 3/5 for improved ability to lift parts at work.    Time 4   Period Weeks     OT SHORT TERM GOAL #4   Title Patient will decrease pain level to 3/10 or better in left shoulder when using his computer.    Time 4   Period Weeks     OT SHORT TERM GOAL #5   Title Patient will improve left shoulder fascial restrictions to min-mod for greater mobility needed for ADL completion.    Time 4   Period Weeks           OT Long Term Goals - 06/25/17 1323      OT LONG TERM GOAL #1   Title Patient will return to prior level of function using left arm normally with all other ADLs, work, leisure activities.    Time 8   Period Weeks   Status On-going     OT LONG TERM GOAL #2   Title Patient will have WNL A/ROM in left shoulder needed to reach overhead and to steer his motorcycle.   Time 8   Period Weeks   Status On-going     OT LONG TERM GOAL #3   Title Patient will have 5/5 strength in his left shoulder for improved ability to lift his kayak onto his truck.    Time 8   Period Weeks   Status On-going     OT LONG TERM GOAL #4   Title Patient will have 2/10 or better pain in his left shoulder while riding his motorcycle.   Time 8   Period Weeks   Status On-going     OT LONG TERM GOAL #5   Title Patient will have trace fascial and scar restrictions in his left shoulder region.    Time 8   Period Weeks   Status On-going               Plan - 08/01/17 7564    Clinical Impression Statement A: Focused on strengthening this session and progressed to 2# weight. Pt struggled with prone hughston exercises due shoulder weakness and instability. VC for form and technique.    Plan P: Focus on strenthening, especially overhead stability. Myofascial release PRN.       Patient  will  benefit from skilled therapeutic intervention in order to improve the following deficits and impairments:  Decreased range of motion, Decreased knowledge of precautions, Decreased strength, Decreased scar mobility, Decreased skin integrity, Increased fascial restricitons, Increased muscle spasms, Impaired UE functional use, Pain  Visit Diagnosis: Stiffness of left shoulder, not elsewhere classified  Acute pain of left shoulder  Other symptoms and signs involving the musculoskeletal system    Problem List Patient Active Problem List   Diagnosis Date Noted  . Complete tear of left rotator cuff   . Carpal tunnel syndrome of right wrist   . Pain in left hip 03/23/2014  . Status post hip replacement 03/23/2014  . S/P hip replacement 09/18/2013  . Right Achilles tendinitis 12/27/2011  . History of total hip arthroplasty 09/05/2011  . Achilles bursitis or tendinitis 08/17/2011  . HIP, ARTHRITIS, DEGEN./OSTEO 11/30/2009  . BACK PAIN 11/30/2009   Ailene Ravel, OTR/L,CBIS  [phone]  08/01/2017, 9:00 AM  [place], [place], [address] Phone: [phone]   Fax:  [phone]  Name: Arthur Aguilar MRN: [id number] Date of Birth: September 21, 1961

## 2017-08-03 ENCOUNTER — Ambulatory Visit (HOSPITAL_COMMUNITY): Payer: Managed Care, Other (non HMO)

## 2017-08-03 ENCOUNTER — Encounter (HOSPITAL_COMMUNITY): Payer: Self-pay

## 2017-08-03 DIAGNOSIS — M25612 Stiffness of left shoulder, not elsewhere classified: Secondary | ICD-10-CM | POA: Diagnosis not present

## 2017-08-03 DIAGNOSIS — M25512 Pain in left shoulder: Secondary | ICD-10-CM

## 2017-08-03 DIAGNOSIS — R29898 Other symptoms and signs involving the musculoskeletal system: Secondary | ICD-10-CM

## 2017-08-03 NOTE — Therapy (Signed)
[place], [place], [address] Phone: [phone]   Fax:  [phone]  Occupational Therapy Treatment  Patient Details  Name: Arthur Aguilar MRN: [id number] Date of Birth: 14-Apr-1961 Referring Provider: Dr. [name]  Encounter Date: 08/03/2017      OT End of Session - 08/03/17 0838    Visit Number 13   Number of Visits 16   Date for OT Re-Evaluation 08/19/17   Authorization Type [employer]   OT Start Time 0820   OT Stop Time 0900   OT Time Calculation (min) 40 min   Activity Tolerance Patient tolerated treatment well   Behavior During Therapy [facility] for tasks assessed/performed      Past Medical History:  Diagnosis Date  . Arthritis   . GERD (gastroesophageal reflux disease)   . History of kidney stones   . Hypercholesteremia   . Scoliosis     Past Surgical History:  Procedure Laterality Date  . CARPAL TUNNEL RELEASE Right 04/02/2015   Procedure: RIGHT CARPAL TUNNEL RELEASE;  Surgeon: [name], MD;  Location: [facility];  Service: Orthopedics;  Laterality: Right;  . dental bone implant     bottom - right jaw.  Marland Kitchen HIP SURGERY    . left hip replacement    . SHOULDER OPEN ROTATOR CUFF REPAIR Left 05/30/2017   Procedure: ROTATOR CUFF REPAIR SHOULDER OPEN;  Surgeon: [name], MD;  Location: [facility];  Service: Orthopedics;  Laterality: Left;    There were no vitals filed for this visit.      Subjective Assessment - 08/03/17 0827    Subjective  S: I was reaching into  the back of my dryer really slowly and it was working this shoulder.   Currently in Pain? No/denies            [facility] OT Assessment - 08/03/17 0828      Assessment   Diagnosis S/P Open Left Rotator Cuff Repair     Precautions   Precautions Shoulder   Type of Shoulder Precautions Per MD:  PROM AS TOLERATED NOW UP TO 6 WEEKS POST OP (07/11/17), progress as tolerated 07/12/17                   OT  Treatments/Exercises (OP) - 08/03/17 0828      Exercises   Exercises Shoulder     Shoulder Exercises: Supine   Protraction PROM;5 reps;Strengthening;15 reps   Protraction Weight (lbs) 2   Horizontal ABduction PROM;5 reps;Strengthening;15 reps   Horizontal ABduction Weight (lbs) 2   External Rotation PROM;5 reps;Strengthening;15 reps   External Rotation Weight (lbs) 2   Internal Rotation PROM;5 reps;Strengthening;15 reps   Internal Rotation Weight (lbs) 2   Flexion PROM;5 reps;Strengthening;15 reps   Shoulder Flexion Weight (lbs) 2   ABduction PROM;5 reps;Strengthening;15 reps   Shoulder ABduction Weight (lbs) 2     Shoulder Exercises: Prone   Other Prone Exercises Hughston exercises; 12X; 2#     Shoulder Exercises: Sidelying   External Rotation Strengthening;15 reps   External Rotation Weight (lbs) 2   Internal Rotation Strengthening;15 reps   Internal Rotation Weight (lbs) 2   Flexion Strengthening;15 reps   Flexion Weight (lbs) 2   ABduction Strengthening;15 reps   ABduction Weight (lbs) 2   Other Sidelying Exercises Protraction: 15X; 2#   Other Sidelying Exercises Horizontal abduction; 15X 2# weight     Shoulder Exercises: Standing   Horizontal ABduction Theraband;12 reps   Theraband  Level (Shoulder Horizontal ABduction) Level 3 (Green)   External Rotation Theraband;12 reps   Theraband Level (Shoulder External Rotation) Level 3 (Green)   Flexion Theraband;12 reps   Theraband Level (Shoulder Flexion) Level 3 (Green)     Shoulder Exercises: ROM/Strengthening   UBE (Upper Arm Bike) Level 2 3' forward 3' reverse   Over Head Lace 2' with 1# wrist weight   Ball on Wall 1' flexion 1' abduction green ball   Other ROM/Strengthening Exercises Arms on Fire; 30 second positions 4 minutes total                  OT Short Term Goals - 07/20/17 6433      OT SHORT TERM GOAL #1   Title Patient will be educated on HEP for improved shoulder mobility.   Time 4   Period  Weeks     OT SHORT TERM GOAL #2   Title Patient will improve left shoulder P/ROM to [facility] in order to don and doff shirts easier.    Time 4   Period Weeks     OT SHORT TERM GOAL #3   Title Patient will improve left shoulder strength to 3/5 for improved ability to lift parts at work.    Time 4   Period Weeks     OT SHORT TERM GOAL #4   Title Patient will decrease pain level to 3/10 or better in left shoulder when using his computer.    Time 4   Period Weeks     OT SHORT TERM GOAL #5   Title Patient will improve left shoulder fascial restrictions to min-mod for greater mobility needed for ADL completion.    Time 4   Period Weeks           OT Long Term Goals - 06/25/17 1323      OT LONG TERM GOAL #1   Title Patient will return to prior level of function using left arm normally with all other ADLs, work, leisure activities.    Time 8   Period Weeks   Status On-going     OT LONG TERM GOAL #2   Title Patient will have WNL A/ROM in left shoulder needed to reach overhead and to steer his motorcycle.   Time 8   Period Weeks   Status On-going     OT LONG TERM GOAL #3   Title Patient will have 5/5 strength in his left shoulder for improved ability to lift his kayak onto his truck.    Time 8   Period Weeks   Status On-going     OT LONG TERM GOAL #4   Title Patient will have 2/10 or better pain in his left shoulder while riding his motorcycle.   Time 8   Period Weeks   Status On-going     OT LONG TERM GOAL #5   Title Patient will have trace fascial and scar restrictions in his left shoulder region.    Time 8   Period Weeks   Status On-going               Plan - 08/03/17 0839    Clinical Impression Statement A: Continued to focus on shoulder and scapular stability by adding Arms on Fire and a 1# wrist weight to overhead lacing task. VC for form and technique were needed.    Plan P: Myofascial release PRN. Continue to work on overhead stability and strength.        Patient will benefit from  skilled therapeutic intervention in order to improve the following deficits and impairments:  Decreased range of motion, Decreased knowledge of precautions, Decreased strength, Decreased scar mobility, Decreased skin integrity, Increased fascial restricitons, Increased muscle spasms, Impaired UE functional use, Pain  Visit Diagnosis: Stiffness of left shoulder, not elsewhere classified  Acute pain of left shoulder  Other symptoms and signs involving the musculoskeletal system    Problem List Patient Active Problem List   Diagnosis Date Noted  . Complete tear of left rotator cuff   . Carpal tunnel syndrome of right wrist   . Pain in left hip 03/23/2014  . Status post hip replacement 03/23/2014  . S/P hip replacement 09/18/2013  . Right Achilles tendinitis 12/27/2011  . History of total hip arthroplasty 09/05/2011  . Achilles bursitis or tendinitis 08/17/2011  . HIP, ARTHRITIS, DEGEN./OSTEO 11/30/2009  . BACK PAIN 11/30/2009   Ailene Ravel, OTR/L,CBIS  [phone]  08/03/2017, 8:59 AM  [place] [address] [place], [place], [address] Phone: [phone]   Fax:  [phone]  Name: Arthur Aguilar MRN: [id number] Date of Birth: [birth year]

## 2017-08-08 ENCOUNTER — Ambulatory Visit (HOSPITAL_COMMUNITY): Payer: 59 | Attending: Orthopedic Surgery

## 2017-08-08 DIAGNOSIS — R29898 Other symptoms and signs involving the musculoskeletal system: Secondary | ICD-10-CM

## 2017-08-08 DIAGNOSIS — M25612 Stiffness of left shoulder, not elsewhere classified: Secondary | ICD-10-CM | POA: Insufficient documentation

## 2017-08-08 DIAGNOSIS — M25512 Pain in left shoulder: Secondary | ICD-10-CM | POA: Insufficient documentation

## 2017-08-08 NOTE — Therapy (Signed)
[place], [place], [address] Phone: [phone]   Fax:  [phone]  Occupational Therapy Treatment  Patient Details  Name: Arthur Aguilar MRN: [id number] Date of Birth: 1961/06/25 Referring Provider: Dr. [name]  Encounter Date: 08/08/2017      OT End of Session - 08/08/17 0856    Visit Number 14   Number of Visits 16   Date for OT Re-Evaluation 08/19/17   Authorization Type [employer]   OT Start Time 0820   OT Stop Time 0900   OT Time Calculation (min) 40 min   Activity Tolerance Patient tolerated treatment well   Behavior During Therapy [facility] for tasks assessed/performed      Past Medical History:  Diagnosis Date  . Arthritis   . GERD (gastroesophageal reflux disease)   . History of kidney stones   . Hypercholesteremia   . Scoliosis     Past Surgical History:  Procedure Laterality Date  . CARPAL TUNNEL RELEASE Right 04/02/2015   Procedure: RIGHT CARPAL TUNNEL RELEASE;  Surgeon: [name], MD;  Location: [facility];  Service: Orthopedics;  Laterality: Right;  . dental bone implant     bottom - right jaw.  Marland Kitchen HIP SURGERY    . left hip replacement    . SHOULDER OPEN ROTATOR CUFF REPAIR Left 05/30/2017   Procedure: ROTATOR CUFF REPAIR SHOULDER OPEN;  Surgeon: [name], MD;  Location: [facility];  Service: Orthopedics;  Laterality: Left;    There were no vitals filed for this visit.          [facility] OT Assessment - 08/08/17 0831      Assessment   Diagnosis S/P Open Left Rotator Cuff Repair     Precautions   Precautions Shoulder   Type of Shoulder Precautions Per MD:  PROM AS TOLERATED NOW UP TO 6 WEEKS POST OP (07/11/17), progress as tolerated 07/12/17                   OT Treatments/Exercises (OP) - 08/08/17 0832      Exercises   Exercises Shoulder     Shoulder Exercises: Supine   Protraction PROM;5 reps;Strengthening;12 reps   Protraction Weight (lbs) 3   Horizontal ABduction PROM;5 reps;Strengthening;12 reps   Horizontal ABduction Weight (lbs) 3   External Rotation PROM;5 reps;Strengthening;12 reps   External Rotation Weight (lbs) 3   Internal Rotation PROM;5 reps;Strengthening;12 reps   Internal Rotation Weight (lbs) 3   Flexion PROM;5 reps;Strengthening;12 reps   Shoulder Flexion Weight (lbs) 3   ABduction PROM;5 reps;Strengthening;12 reps   Shoulder ABduction Weight (lbs) 3     Shoulder Exercises: Sidelying   External Rotation Strengthening;12 reps   External Rotation Weight (lbs) 3   Internal Rotation Strengthening;12 reps   Internal Rotation Weight (lbs) 3   Flexion Strengthening;12 reps   Flexion Weight (lbs) 3   ABduction Strengthening;12 reps   ABduction Weight (lbs) 3   Other Sidelying Exercises Protraction: 12X; 3#   Other Sidelying Exercises Horizontal abduction; 12X 3# weight     Shoulder Exercises: Standing   Horizontal ABduction Theraband;12 reps   Theraband Level (Shoulder Horizontal ABduction) Level 3 (Green)   External Rotation Theraband;12 reps   Theraband Level (Shoulder External Rotation) Level 3 (Green)   Flexion Theraband;12 reps   Theraband Level (Shoulder Flexion) Level 3 (Green)   Other Standing Exercises Overhead press 3#; 10X   Other Standing Exercises Walking overhead press with arm locked; from treatment  bay to waiting room door and back     Shoulder Exercises: ROM/Strengthening   UBE (Upper Arm Bike) Level 2 3' forward 3' reverse   Over Head Lace 2' with 1# wrist weight   X to V Arms 10X with 3#   Proximal Shoulder Strengthening, Supine 12X with 3# no rest breaks   Proximal Shoulder Strengthening, Seated 12X with 3# no rest breaks                  OT Short Term Goals - 07/20/17 7829      OT SHORT TERM GOAL #1   Title Patient will be educated on HEP for improved shoulder mobility.   Time 4   Period Weeks     OT SHORT TERM GOAL #2   Title Patient will improve left shoulder P/ROM  to [facility] in order to don and doff shirts easier.    Time 4   Period Weeks     OT SHORT TERM GOAL #3   Title Patient will improve left shoulder strength to 3/5 for improved ability to lift parts at work.    Time 4   Period Weeks     OT SHORT TERM GOAL #4   Title Patient will decrease pain level to 3/10 or better in left shoulder when using his computer.    Time 4   Period Weeks     OT SHORT TERM GOAL #5   Title Patient will improve left shoulder fascial restrictions to min-mod for greater mobility needed for ADL completion.    Time 4   Period Weeks           OT Long Term Goals - 06/25/17 1323      OT LONG TERM GOAL #1   Title Patient will return to prior level of function using left arm normally with all other ADLs, work, leisure activities.    Time 8   Period Weeks   Status On-going     OT LONG TERM GOAL #2   Title Patient will have WNL A/ROM in left shoulder needed to reach overhead and to steer his motorcycle.   Time 8   Period Weeks   Status On-going     OT LONG TERM GOAL #3   Title Patient will have 5/5 strength in his left shoulder for improved ability to lift his kayak onto his truck.    Time 8   Period Weeks   Status On-going     OT LONG TERM GOAL #4   Title Patient will have 2/10 or better pain in his left shoulder while riding his motorcycle.   Time 8   Period Weeks   Status On-going     OT LONG TERM GOAL #5   Title Patient will have trace fascial and scar restrictions in his left shoulder region.    Time 8   Period Weeks   Status On-going               Plan - 08/08/17 5621    Clinical Impression Statement A: Added activities to focus on shoulder and scapular stability overhead. patient continues to show weakness with the ability to lock arm overhead to maintain stability.    Plan P: Myofascial release PRN. Continue with overhead stability and strength. Add power tower.       Patient will benefit from skilled therapeutic intervention in  order to improve the following deficits and impairments:  Decreased range of motion, Decreased knowledge of precautions, Decreased strength, Decreased scar  mobility, Decreased skin integrity, Increased fascial restricitons, Increased muscle spasms, Impaired UE functional use, Pain  Visit Diagnosis: Stiffness of left shoulder, not elsewhere classified  Acute pain of left shoulder  Other symptoms and signs involving the musculoskeletal system    Problem List Patient Active Problem List   Diagnosis Date Noted  . Complete tear of left rotator cuff   . Carpal tunnel syndrome of right wrist   . Pain in left hip 03/23/2014  . Status post hip replacement 03/23/2014  . S/P hip replacement 09/18/2013  . Right Achilles tendinitis 12/27/2011  . History of total hip arthroplasty 09/05/2011  . Achilles bursitis or tendinitis 08/17/2011  . HIP, ARTHRITIS, DEGEN./OSTEO 11/30/2009  . BACK PAIN 11/30/2009   Ailene Ravel, OTR/L,CBIS  [phone]  08/08/2017, 8:59 AM  [place] [address] [place], [place], [address] Phone: [phone]   Fax:  [phone]  Name: DEMICO PLOCH MRN: [id number] Date of Birth: 1961/05/04

## 2017-08-10 ENCOUNTER — Ambulatory Visit (HOSPITAL_COMMUNITY): Payer: 59

## 2017-08-10 ENCOUNTER — Encounter (HOSPITAL_COMMUNITY): Payer: Self-pay

## 2017-08-10 DIAGNOSIS — R29898 Other symptoms and signs involving the musculoskeletal system: Secondary | ICD-10-CM

## 2017-08-10 DIAGNOSIS — M25612 Stiffness of left shoulder, not elsewhere classified: Secondary | ICD-10-CM | POA: Diagnosis not present

## 2017-08-10 DIAGNOSIS — M25512 Pain in left shoulder: Secondary | ICD-10-CM

## 2017-08-10 NOTE — Therapy (Signed)
[place], [place], [address] Phone: [phone]   Fax:  [phone]  Occupational Therapy Treatment  Patient Details  Name: Arthur Aguilar MRN: [id number] Date of Birth: September 20, 1961 Referring Provider: Dr. [name]  Encounter Date: 08/10/2017      OT End of Session - 08/10/17 0914    Visit Number 15   Number of Visits 16   Date for OT Re-Evaluation 08/19/17   Authorization Type [employer]   OT Start Time 0820   OT Stop Time 0900   OT Time Calculation (min) 40 min   Activity Tolerance Patient tolerated treatment well   Behavior During Therapy [facility] for tasks assessed/performed      Past Medical History:  Diagnosis Date  . Arthritis   . GERD (gastroesophageal reflux disease)   . History of kidney stones   . Hypercholesteremia   . Scoliosis     Past Surgical History:  Procedure Laterality Date  . CARPAL TUNNEL RELEASE Right 04/02/2015   Procedure: RIGHT CARPAL TUNNEL RELEASE;  Surgeon: [name], MD;  Location: [facility];  Service: Orthopedics;  Laterality: Right;  . dental bone implant     bottom - right jaw.  Marland Kitchen HIP SURGERY    . left hip replacement    . SHOULDER OPEN ROTATOR CUFF REPAIR Left 05/30/2017   Procedure: ROTATOR CUFF REPAIR SHOULDER OPEN;  Surgeon: [name], MD;  Location: [facility];  Service: Orthopedics;  Laterality: Left;    There were no vitals filed for this visit.      Subjective Assessment - 08/10/17 0830    Subjective  S: I was doing a lot of things around the house yesterday.   Currently in Pain? No/denies            [facility] OT Assessment - 08/10/17 0831      Assessment   Diagnosis S/P Open Left Rotator Cuff Repair     Precautions   Precautions Shoulder   Type of Shoulder Precautions Per MD:  PROM AS TOLERATED NOW UP TO 6 WEEKS POST OP (07/11/17), progress as tolerated 07/12/17                   OT Treatments/Exercises (OP) - 08/10/17 0831       Exercises   Exercises Shoulder     Shoulder Exercises: Supine   Protraction PROM;5 reps;Strengthening;15 reps   Protraction Weight (lbs) 3   Horizontal ABduction PROM;5 reps;Strengthening;15 reps   Horizontal ABduction Weight (lbs) 3   External Rotation PROM;5 reps;Strengthening;15 reps   External Rotation Weight (lbs) 3   Internal Rotation PROM;5 reps;Strengthening;15 reps   Internal Rotation Weight (lbs) 3   Flexion PROM;5 reps;Strengthening;15 reps   Shoulder Flexion Weight (lbs) 3   ABduction PROM;5 reps;Strengthening;15 reps   Shoulder ABduction Weight (lbs) 3     Shoulder Exercises: Prone   Other Prone Exercises [place] exercises; 12X; 3#     Shoulder Exercises: Standing   Other Standing Exercises Walking overhead press with arm locked; from treatment bay to waiting room door and back; 2 trials     Shoulder Exercises: ROM/Strengthening   UBE (Upper Arm Bike) Level 3 3' forward 3' reverse   Cybex Press 15 reps  4 plate   Cybex Row 15 reps  4 plate   Proximal Shoulder Strengthening, Supine 15X with 3# no rest breaks     Shoulder Exercises: Power Public affairs consultant Limitations @  9 degrees   Retraction 10 reps   Retraction Limitations @ 9 degrees   Row 10 reps   Row Limitations @ 7 degrees   External Rotation 10 reps   External Rotation Limitations @ 7 degrees   Internal Rotation 10 reps   Internal Rotation Limitations @ 9 degrees   Other Power Tower Exercises Horizontal abduction; 10X; @ 5 degrees                  OT Short Term Goals - 07/20/17 0858      OT SHORT TERM GOAL #1   Title Patient will be educated on HEP for improved shoulder mobility.   Time 4   Period Weeks     OT SHORT TERM GOAL #2   Title Patient will improve left shoulder P/ROM to [facility] in order to don and doff shirts easier.    Time 4   Period Weeks     OT SHORT TERM GOAL #3   Title Patient will improve left shoulder strength to 3/5 for improved ability to  lift parts at work.    Time 4   Period Weeks     OT SHORT TERM GOAL #4   Title Patient will decrease pain level to 3/10 or better in left shoulder when using his computer.    Time 4   Period Weeks     OT SHORT TERM GOAL #5   Title Patient will improve left shoulder fascial restrictions to min-mod for greater mobility needed for ADL completion.    Time 4   Period Weeks           OT Long Term Goals - 06/25/17 1323      OT LONG TERM GOAL #1   Title Patient will return to prior level of function using left arm normally with all other ADLs, work, leisure activities.    Time 8   Period Weeks   Status On-going     OT LONG TERM GOAL #2   Title Patient will have WNL A/ROM in left shoulder needed to reach overhead and to steer his motorcycle.   Time 8   Period Weeks   Status On-going     OT LONG TERM GOAL #3   Title Patient will have 5/5 strength in his left shoulder for improved ability to lift his kayak onto his truck.    Time 8   Period Weeks   Status On-going     OT LONG TERM GOAL #4   Title Patient will have 2/10 or better pain in his left shoulder while riding his motorcycle.   Time 8   Period Weeks   Status On-going     OT LONG TERM GOAL #5   Title Patient will have trace fascial and scar restrictions in his left shoulder region.    Time 8   Period Weeks   Status On-going               Plan - 08/10/17 0914    Clinical Impression Statement A: Added power tower and cybex row/press to increase shoulder strength and stability. patient able to complete new exercises with VC for form and technique. Mild difficulty with form during power tower due to muscle weakness.    Plan P: myofascial release PRN. Continue with power tower. Increase overhead walking weight to 2#.       Patient will benefit from skilled therapeutic intervention in order to improve the following deficits and impairments:  Decreased range of motion, Decreased  knowledge of precautions,  Decreased strength, Decreased scar mobility, Decreased skin integrity, Increased fascial restricitons, Increased muscle spasms, Impaired UE functional use, Pain  Visit Diagnosis: Stiffness of left shoulder, not elsewhere classified  Acute pain of left shoulder  Other symptoms and signs involving the musculoskeletal system    Problem List Patient Active Problem List   Diagnosis Date Noted  . Complete tear of left rotator cuff   . Carpal tunnel syndrome of right wrist   . Pain in left hip 03/23/2014  . Status post hip replacement 03/23/2014  . S/P hip replacement 09/18/2013  . Right Achilles tendinitis 12/27/2011  . History of total hip arthroplasty 09/05/2011  . Achilles bursitis or tendinitis 08/17/2011  . HIP, ARTHRITIS, DEGEN./OSTEO 11/30/2009  . BACK PAIN 11/30/2009   Ailene Ravel, OTR/L,CBIS  [phone]  08/10/2017, 9:17 AM  [facility] [address] [place], [place], [address] Phone: [phone]   Fax:  [phone]  Name: Arthur Aguilar MRN: [id number] Date of Birth: 1961/07/31

## 2017-08-14 ENCOUNTER — Ambulatory Visit: Payer: Managed Care, Other (non HMO) | Admitting: Orthopedic Surgery

## 2017-08-15 ENCOUNTER — Encounter (HOSPITAL_COMMUNITY): Payer: Self-pay

## 2017-08-15 ENCOUNTER — Ambulatory Visit (HOSPITAL_COMMUNITY): Payer: 59

## 2017-08-15 DIAGNOSIS — M25512 Pain in left shoulder: Secondary | ICD-10-CM

## 2017-08-15 DIAGNOSIS — M25612 Stiffness of left shoulder, not elsewhere classified: Secondary | ICD-10-CM | POA: Diagnosis not present

## 2017-08-15 DIAGNOSIS — R29898 Other symptoms and signs involving the musculoskeletal system: Secondary | ICD-10-CM

## 2017-08-15 NOTE — Therapy (Signed)
[place] [address], [place], [address] Phone: [phone]   Fax:  [phone]  Occupational Therapy Treatment  Patient Details  Name: DARIL WARGA MRN: [id number] Date of Birth: 1961/04/09 Referring Provider: Dr. [name]  Encounter Date: 08/15/2017      OT End of Session - 08/15/17 0857    Visit Number 16   Number of Visits 17   Date for OT Re-Evaluation 08/19/17   Authorization Type [employer]   OT Start Time 0818   OT Stop Time 0900   OT Time Calculation (min) 42 min   Activity Tolerance Patient tolerated treatment well   Behavior During Therapy [facility] for tasks assessed/performed      Past Medical History:  Diagnosis Date  . Arthritis   . GERD (gastroesophageal reflux disease)   . History of kidney stones   . Hypercholesteremia   . Scoliosis     Past Surgical History:  Procedure Laterality Date  . CARPAL TUNNEL RELEASE Right 04/02/2015   Procedure: RIGHT CARPAL TUNNEL RELEASE;  Surgeon: [name], MD;  Location: [facility];  Service: Orthopedics;  Laterality: Right;  . dental bone implant     bottom - right jaw.  Marland Kitchen HIP SURGERY    . left hip replacement    . SHOULDER OPEN ROTATOR CUFF REPAIR Left 05/30/2017   Procedure: ROTATOR CUFF REPAIR SHOULDER OPEN;  Surgeon: [name], MD;  Location: [facility];  Service: Orthopedics;  Laterality: Left;    There were no vitals filed for this visit.      Subjective Assessment - 08/15/17 0831    Subjective  S: I've been working it.   Currently in Pain? No/denies            [facility] OT Assessment - 08/15/17 0831      Assessment   Diagnosis S/P Open Left Rotator Cuff Repair     Precautions   Precautions Shoulder   Type of Shoulder Precautions Progress as tolerated.                   OT Treatments/Exercises (OP) - 08/15/17 0831      Exercises   Exercises Shoulder     Shoulder Exercises: Supine   Protraction PROM;5  reps;Strengthening;15 reps   Protraction Weight (lbs) 3   Horizontal ABduction PROM;5 reps;Strengthening;15 reps   Horizontal ABduction Weight (lbs) 3   External Rotation PROM;5 reps;Strengthening;15 reps   External Rotation Weight (lbs) 3   Internal Rotation PROM;5 reps;Strengthening;15 reps   Internal Rotation Weight (lbs) 3   Flexion PROM;5 reps;Strengthening;15 reps   Shoulder Flexion Weight (lbs) 3   ABduction PROM;5 reps;Strengthening;15 reps   Shoulder ABduction Weight (lbs) 3     Shoulder Exercises: Prone   Other Prone Exercises [place] exercises; 12X; 3#     Shoulder Exercises: Standing   Protraction Strengthening;15 reps   Protraction Weight (lbs) 3   Flexion Strengthening;15 reps   Shoulder Flexion Weight (lbs) 3   ABduction Strengthening;15 reps   Shoulder ABduction Weight (lbs) 3   Other Standing Exercises Walking overhead press with arm locked; from treatment bay to waiting room door and back; 2 trials with 2# weight. Able to make 1.5 trials before needing a rest break.     Shoulder Exercises: ROM/Strengthening   UBE (Upper Arm Bike) Level 3 3' forward 3' reverse   Cybex Press 15 reps  4 plate   Cybex Row 15 reps  4 plate   Proximal Shoulder  Strengthening, Supine 15X with 3# no rest breaks   Proximal Shoulder Strengthening, Seated 15X with 3# no rest breaks     Shoulder Exercises: Power Public affairs consultant Limitations @ 9 degrees   Retraction 10 reps   Retraction Limitations @ 9 degrees   Row 10 reps   Row Limitations @ 7 degrees   External Rotation 10 reps   External Rotation Limitations @ 7 degrees   Internal Rotation 10 reps   Internal Rotation Limitations @ 9 degrees   Other Power Tower Exercises Horizontal abduction; 10X; @ 5 degrees                  OT Short Term Goals - 07/20/17 0858      OT SHORT TERM GOAL #1   Title Patient will be educated on HEP for improved shoulder mobility.   Time 4   Period Weeks     OT  SHORT TERM GOAL #2   Title Patient will improve left shoulder P/ROM to [facility] in order to don and doff shirts easier.    Time 4   Period Weeks     OT SHORT TERM GOAL #3   Title Patient will improve left shoulder strength to 3/5 for improved ability to lift parts at work.    Time 4   Period Weeks     OT SHORT TERM GOAL #4   Title Patient will decrease pain level to 3/10 or better in left shoulder when using his computer.    Time 4   Period Weeks     OT SHORT TERM GOAL #5   Title Patient will improve left shoulder fascial restrictions to min-mod for greater mobility needed for ADL completion.    Time 4   Period Weeks           OT Long Term Goals - 06/25/17 1323      OT LONG TERM GOAL #1   Title Patient will return to prior level of function using left arm normally with all other ADLs, work, leisure activities.    Time 8   Period Weeks   Status On-going     OT LONG TERM GOAL #2   Title Patient will have WNL A/ROM in left shoulder needed to reach overhead and to steer his motorcycle.   Time 8   Period Weeks   Status On-going     OT LONG TERM GOAL #3   Title Patient will have 5/5 strength in his left shoulder for improved ability to lift his kayak onto his truck.    Time 8   Period Weeks   Status On-going     OT LONG TERM GOAL #4   Title Patient will have 2/10 or better pain in his left shoulder while riding his motorcycle.   Time 8   Period Weeks   Status On-going     OT LONG TERM GOAL #5   Title Patient will have trace fascial and scar restrictions in his left shoulder region.    Time 8   Period Weeks   Status On-going               Plan - 08/15/17 3419    Clinical Impression Statement A: Increased overhead walking to 2# although patient required 1 rest breaks due to muscle weakness. VC form and technique as needed.    Plan P: Reassessment and D/C with HEP. Myofascial release PRN.       Patient will benefit  from skilled therapeutic intervention in  order to improve the following deficits and impairments:  Decreased range of motion, Decreased knowledge of precautions, Decreased strength, Decreased scar mobility, Decreased skin integrity, Increased fascial restricitons, Increased muscle spasms, Impaired UE functional use, Pain  Visit Diagnosis: Acute pain of left shoulder  Stiffness of left shoulder, not elsewhere classified  Other symptoms and signs involving the musculoskeletal system    Problem List Patient Active Problem List   Diagnosis Date Noted  . Complete tear of left rotator cuff   . Carpal tunnel syndrome of right wrist   . Pain in left hip 03/23/2014  . Status post hip replacement 03/23/2014  . S/P hip replacement 09/18/2013  . Right Achilles tendinitis 12/27/2011  . History of total hip arthroplasty 09/05/2011  . Achilles bursitis or tendinitis 08/17/2011  . HIP, ARTHRITIS, DEGEN./OSTEO 11/30/2009  . BACK PAIN 11/30/2009   Ailene Ravel, OTR/L,CBIS  [phone]  08/15/2017, 9:30 AM  [facility] [address] [place], [place], [address] Phone: [phone]   Fax:  [phone]  Name: JENE ORAVEC MRN: [id number] Date of Birth: 1961-07-01

## 2017-08-17 ENCOUNTER — Encounter (HOSPITAL_COMMUNITY): Payer: Self-pay | Admitting: Occupational Therapy

## 2017-08-17 ENCOUNTER — Ambulatory Visit (HOSPITAL_COMMUNITY): Payer: 59 | Admitting: Occupational Therapy

## 2017-08-17 DIAGNOSIS — M25612 Stiffness of left shoulder, not elsewhere classified: Secondary | ICD-10-CM

## 2017-08-17 DIAGNOSIS — M25512 Pain in left shoulder: Secondary | ICD-10-CM

## 2017-08-17 DIAGNOSIS — R29898 Other symptoms and signs involving the musculoskeletal system: Secondary | ICD-10-CM

## 2017-08-17 NOTE — Patient Instructions (Signed)
Theraband strengthening: Complete 10-15X, 1-2X/day  1) Shoulder protraction  Anchor band in doorway, stand with back to door. Push your hand forward as much as you can to bringing your shoulder blades forward on your rib cage.     2) Shoulder flexion  While standing with back to the door, holding Theraband at hand level, raise arm in front of you.  Keep elbow straight through entire movement.      3) Shoulder horizontal abduction  Standing with a theraband anchored at chest height, begin with arm straight and some tension in the band. Move your arm out to your side (keeping straight the whole time). Bring the affected arm back to midline.     4) Shoulder Internal Rotation  While holding an elastic band at your side with your elbow bent, start with your hand away from your stomach, then pull the band towards your stomach. Keep your elbow near your side the entire time.     5) Shoulder External Rotation  While holding an elastic band at your side with your elbow bent, start with your hand near your stomach and then pull the band away. Keep your elbow at your side the entire time.     6) Shoulder abduction  While holding an elastic band at your side, draw up your arm to the side keeping your elbow straight.   

## 2017-08-17 NOTE — Therapy (Signed)
[place], [place], [address] Phone: [phone]   Fax:  [phone]  Occupational Therapy Reassessment, Treatment, and Discharge Summary  Patient Details  Name: Arthur Aguilar MRN: [id number] Date of Birth: [birth year] Referring Provider: Dr. [name]  Encounter Date: 08/17/2017      OT End of Session - 08/17/17 0930    Visit Number 17   Number of Visits 17   Date for OT Re-Evaluation 08/19/17   Authorization Type [employer]   OT Start Time [phone]   OT Stop Time 0929   OT Time Calculation (min) 38 min   Activity Tolerance Patient tolerated treatment well   Behavior During Therapy [facility] for tasks assessed/performed      Past Medical History:  Diagnosis Date  . Arthritis   . GERD (gastroesophageal reflux disease)   . History of kidney stones   . Hypercholesteremia   . Scoliosis     Past Surgical History:  Procedure Laterality Date  . CARPAL TUNNEL RELEASE Right 04/02/2015   Procedure: RIGHT CARPAL TUNNEL RELEASE;  Surgeon: [name], MD;  Location: [facility];  Service: Orthopedics;  Laterality: Right;  . dental bone implant     bottom - right jaw.  Marland Kitchen HIP SURGERY    . left hip replacement    . SHOULDER OPEN ROTATOR CUFF REPAIR Left 05/30/2017   Procedure: ROTATOR CUFF REPAIR SHOULDER OPEN;  Surgeon: [name], MD;  Location: [facility];  Service: Orthopedics;  Laterality: Left;    There were no vitals filed for this visit.      Subjective Assessment - 08/17/17 0850    Subjective  S: No issues, everything is feeling good.    Currently in Pain? No/denies            [facility] OT Assessment - 08/17/17 0848      Assessment   Diagnosis S/P Open Left Rotator Cuff Repair     Precautions   Precautions Shoulder   Type of Shoulder Precautions Progress as tolerated.      AROM   Overall AROM Comments Assessed seated, er/IR adducted   AROM Assessment Site Shoulder   Right/Left Shoulder Left    Left Shoulder Flexion 160 Degrees  135 previous   Left Shoulder ABduction 180 Degrees  same as previous   Left Shoulder Internal Rotation 90 Degrees  same as previous   Left Shoulder External Rotation 80 Degrees  same as previous     PROM   Overall PROM Comments assessed in supine, external and internal rotation with shoulder adducted   PROM Assessment Site Shoulder   Right/Left Shoulder Left   Left Shoulder Flexion 169 Degrees  140 previous   Left Shoulder ABduction 180 Degrees  same as previous   Left Shoulder Internal Rotation 90 Degrees  90 previous   Left Shoulder External Rotation 90 Degrees  75 previous     Strength   Overall Strength Comments Assessed standing. IR/er adducted.    Strength Assessment Site Shoulder   Right/Left Shoulder Left   Left Shoulder Flexion 4+/5  3/5 previous   Left Shoulder ABduction 4+/5  3/5 previous   Left Shoulder Internal Rotation 5/5  3/5 previous   Left Shoulder External Rotation 4+/5  3/5 previous                  OT Treatments/Exercises (OP) - 08/17/17 0857      Exercises   Exercises Shoulder     Shoulder Exercises:  Supine   Protraction PROM;5 reps;Strengthening;15 reps   Protraction Weight (lbs) 3   Horizontal ABduction PROM;5 reps;Strengthening;15 reps   Horizontal ABduction Weight (lbs) 3   External Rotation PROM;5 reps;Strengthening;15 reps   External Rotation Weight (lbs) 3   Internal Rotation PROM;5 reps;Strengthening;15 reps   Internal Rotation Weight (lbs) 3   Flexion PROM;5 reps;Strengthening;15 reps   Shoulder Flexion Weight (lbs) 3   ABduction PROM;5 reps;Strengthening;15 reps   Shoulder ABduction Weight (lbs) 3     Shoulder Exercises: Prone   Other Prone Exercises [place] exercises; 12X; 3#     Shoulder Exercises: Standing   Protraction Strengthening;15 reps;Theraband;10 reps   Theraband Level (Shoulder Protraction) Level 3 (Green)   Protraction Weight (lbs) 3   Horizontal ABduction  Strengthening;15 reps;Theraband;10 reps   Theraband Level (Shoulder Horizontal ABduction) Level 3 (Green)   Horizontal ABduction Weight (lbs) 3   External Rotation Strengthening;15 reps;Theraband;10 reps   Theraband Level (Shoulder External Rotation) Level 3 (Green)   External Rotation Weight (lbs) 3   Internal Rotation Strengthening;15 reps;Theraband;10 reps   Theraband Level (Shoulder Internal Rotation) Level 3 (Green)   Internal Rotation Weight (lbs) 3   Flexion Strengthening;15 reps;Theraband;10 reps   Theraband Level (Shoulder Flexion) Level 3 (Green)   Shoulder Flexion Weight (lbs) 3   ABduction Strengthening;15 reps;Theraband;10 reps   Theraband Level (Shoulder ABduction) Level 3 (Green)   Shoulder ABduction Weight (lbs) 3   Other Standing Exercises Walking overhead press with arm locked; from treatment bay to waiting room door and back; 2 trials with 2# weight     Shoulder Exercises: ROM/Strengthening   UBE (Upper Arm Bike) Level 3 3' forward 3' reverse   Cybex Press 15 reps  4# plate   Cybex Row 15 reps  4# plate   Proximal Shoulder Strengthening, Supine 15X with 3# no rest breaks   Proximal Shoulder Strengthening, Seated 15X with 3# no rest breaks                OT Education - 08/17/17 0858    Education provided Yes   Education Details theraband strengthening-green   Person(s) Educated Patient   Methods Explanation;Demonstration;Handout   Comprehension Verbalized understanding;Returned demonstration          OT Short Term Goals - 07/20/17 0858      OT SHORT TERM GOAL #1   Title Patient will be educated on HEP for improved shoulder mobility.   Time 4   Period Weeks     OT SHORT TERM GOAL #2   Title Patient will improve left shoulder P/ROM to [facility] in order to don and doff shirts easier.    Time 4   Period Weeks     OT SHORT TERM GOAL #3   Title Patient will improve left shoulder strength to 3/5 for improved ability to lift parts at work.    Time 4    Period Weeks     OT SHORT TERM GOAL #4   Title Patient will decrease pain level to 3/10 or better in left shoulder when using his computer.    Time 4   Period Weeks     OT SHORT TERM GOAL #5   Title Patient will improve left shoulder fascial restrictions to min-mod for greater mobility needed for ADL completion.    Time 4   Period Weeks           OT Long Term Goals - 08/17/17 0930      OT LONG TERM GOAL #1  Title Patient will return to prior level of function using left arm normally with all other ADLs, work, leisure activities.    Time 8   Period Weeks   Status Achieved     OT LONG TERM GOAL #2   Title Patient will have WNL A/ROM in left shoulder needed to reach overhead and to steer his motorcycle.   Time 8   Period Weeks   Status Achieved     OT LONG TERM GOAL #3   Title Patient will have 5/5 strength in his left shoulder for improved ability to lift his kayak onto his truck.    Time 8   Period Weeks   Status Partially Met     OT LONG TERM GOAL #4   Title Patient will have 2/10 or better pain in his left shoulder while riding his motorcycle.   Time 8   Period Weeks   Status Achieved     OT LONG TERM GOAL #5   Title Patient will have trace fascial and scar restrictions in his left shoulder region.    Time 8   Period Weeks   Status Achieved               Plan - 08/17/17 0930    Clinical Impression Statement A: Reassessment completed this session, pt has met all STGs and 4/5 LTGs with 1 LTG partially met, progressing with strength, ROM, and pain. Pt reports he is now able to complete all B/IADL tasks with minimal difficulty. Updated HEP for theraband strengthening. Pt is agreeable to discharge with HEP.    Plan P: Discharge pt   OT Home Exercise Plan 06/20/17:  towel slides. 8/9: Stop table slides and begin AA/ROM standing. 9/14: green theraband strengthening   Consulted and Agree with Plan of Care Patient      Patient will benefit from skilled  therapeutic intervention in order to improve the following deficits and impairments:  Decreased range of motion, Decreased knowledge of precautions, Decreased strength, Decreased scar mobility, Decreased skin integrity, Increased fascial restricitons, Increased muscle spasms, Impaired UE functional use, Pain  Visit Diagnosis: Acute pain of left shoulder  Stiffness of left shoulder, not elsewhere classified  Other symptoms and signs involving the musculoskeletal system    Problem List Patient Active Problem List   Diagnosis Date Noted  . Complete tear of left rotator cuff   . Carpal tunnel syndrome of right wrist   . Pain in left hip 03/23/2014  . Status post hip replacement 03/23/2014  . S/P hip replacement 09/18/2013  . Right Achilles tendinitis 12/27/2011  . History of total hip arthroplasty 09/05/2011  . Achilles bursitis or tendinitis 08/17/2011  . HIP, ARTHRITIS, DEGEN./OSTEO 11/30/2009  . BACK PAIN 11/30/2009   Guadelupe Sabin, OTR/L  [phone] 08/17/2017, 9:32 AM  [place], [place], [address] Phone: [phone]   Fax:  [phone]  Name: AARION METZGAR MRN: [id number] Date of Birth: 1961/05/24   OCCUPATIONAL THERAPY DISCHARGE SUMMARY  Visits from Start of Care: 17  Current functional level related to goals / functional outcomes: See above. Pt has made great progress with ROM, strength, and functional use of LUE. Pt is now completing B/IADL tasks independently with little to no difficulty.    Remaining deficits: Pt has soreness after sustained use, continues to have slight strength deficits   Education / Equipment: Pt educated on green theraband strengthening exercises Plan: Patient agrees to discharge.  Patient goals were met.  Patient is being discharged due to meeting the stated rehab goals.  ?????

## 2017-08-22 ENCOUNTER — Telehealth: Payer: Self-pay | Admitting: Orthopedic Surgery

## 2017-08-22 ENCOUNTER — Other Ambulatory Visit: Payer: Self-pay | Admitting: *Deleted

## 2017-08-22 ENCOUNTER — Encounter: Payer: Self-pay | Admitting: Orthopedic Surgery

## 2017-08-22 ENCOUNTER — Ambulatory Visit (INDEPENDENT_AMBULATORY_CARE_PROVIDER_SITE_OTHER): Payer: 59 | Admitting: Orthopedic Surgery

## 2017-08-22 VITALS — BP 146/93 | HR 95 | Ht 72.0 in | Wt 251.0 lb

## 2017-08-22 DIAGNOSIS — Z4889 Encounter for other specified surgical aftercare: Secondary | ICD-10-CM

## 2017-08-22 DIAGNOSIS — Z9889 Other specified postprocedural states: Secondary | ICD-10-CM

## 2017-08-22 MED ORDER — HYDROCODONE-ACETAMINOPHEN 5-325 MG PO TABS: 1.0000 | ORAL_TABLET | Freq: Three times a day (TID) | ORAL | 0 refills | Status: DC | PRN

## 2017-08-22 MED ORDER — IBUPROFEN 800 MG PO TABS: 800.0000 mg | ORAL_TABLET | Freq: Three times a day (TID) | ORAL | 1 refills | Status: DC | PRN

## 2017-08-22 MED ORDER — METHOCARBAMOL 500 MG PO TABS: 500.0000 mg | ORAL_TABLET | Freq: Four times a day (QID) | ORAL | 1 refills | Status: DC

## 2017-08-22 NOTE — Telephone Encounter (Signed)
Sent to [facility]

## 2017-08-22 NOTE — Telephone Encounter (Signed)
Patient was seen for office visit today, 08/22/17. Fax has been received again (2nd time) from Arthur Aguilar, Arthur Aguilar, ph [phone] / fax [phone] for medication: Ibuprofen 800MG  tablet, 1 by mouth every 8 hours as needed. Please advise

## 2017-08-22 NOTE — Progress Notes (Signed)
Postop follow-up rotator cuff repair left shoulder  Postop day 84  Patient complains of mild activity related discomfort  He regained his flexion to 150 he has good strength in abduction and flexion  He is cleared for release  Meds ordered this encounter  Medications  . methocarbamol (ROBAXIN) 500 MG tablet    Sig: Take 1 tablet (500 mg total) by mouth 4 (four) times daily.    Dispense:  60 tablet    Refill:  1  . HYDROcodone-acetaminophen (NORCO) 5-325 MG tablet    Sig: Take 1 tablet by mouth every 8 (eight) hours as needed for moderate pain.    Dispense:  30 tablet    Refill:  0   PMP AWARE Reviewed.  FU PRN

## 2017-11-09 ENCOUNTER — Ambulatory Visit (INDEPENDENT_AMBULATORY_CARE_PROVIDER_SITE_OTHER): Payer: 59 | Admitting: Orthopedic Surgery

## 2017-11-09 ENCOUNTER — Ambulatory Visit (INDEPENDENT_AMBULATORY_CARE_PROVIDER_SITE_OTHER): Payer: 59

## 2017-11-09 VITALS — BP 160/110 | HR 65 | Ht 72.0 in | Wt 250.0 lb

## 2017-11-09 DIAGNOSIS — M75121 Complete rotator cuff tear or rupture of right shoulder, not specified as traumatic: Secondary | ICD-10-CM

## 2017-11-09 DIAGNOSIS — F411 Generalized anxiety disorder: Secondary | ICD-10-CM

## 2017-11-09 DIAGNOSIS — M25511 Pain in right shoulder: Secondary | ICD-10-CM

## 2017-11-09 MED ORDER — DIAZEPAM 10 MG PO TABS: 10.0000 mg | ORAL_TABLET | Freq: Once | ORAL | 0 refills | Status: AC

## 2017-11-09 NOTE — Progress Notes (Signed)
Progress Note   Patient ID: Arthur Aguilar, male   DOB: 1961/08/06, 56 y.o.   MRN: [id number]  Chief Complaint  Patient presents with  . Shoulder Pain    Right shoulder pain, DOI 11-04-17.    HPI 56 year old male status post left rotator cuff repair presents for evaluation of pain right shoulder after fall with weakness and painful forward elevation.  Patient fell  going down a ramp, complains of anterior shoulder pain has lost the ability to raise his arm or.  He has cramping of the right arm.  Right shoulder 1 week dull throbbing cramping moderate pain constant worse with overhead activity weakness  Review of Systems  Constitutional: Negative for chills, fever and weight loss.  Respiratory: Negative for shortness of breath.   Cardiovascular: Negative for chest pain.  Neurological: Negative for tingling.   No outpatient medications have been marked as taking for the 11/09/17 encounter (Office Visit) with [name], MD.    Past Medical History:  Diagnosis Date  . Arthritis   . GERD (gastroesophageal reflux disease)   . History of kidney stones   . Hypercholesteremia   . Scoliosis      No Known Allergies  BP (!) 160/110   Pulse 65   Ht 6' (1.829 m)   Wt 250 lb (113.4 kg)   BMI 33.91 kg/m    Physical Exam  Constitutional: He is oriented to person, place, and time. He appears well-developed and well-nourished.  Vital signs have been reviewed and are stable. Gen. appearance the patient is well-developed and well-nourished with normal grooming and hygiene.   Musculoskeletal:  GAIT IS normal   Neurological: He is alert and oriented to person, place, and time.  Skin: Skin is warm and dry. No erythema.  Psychiatric: He has a normal mood and affect.  Vitals reviewed.   Right Shoulder Exam   Tenderness  Right shoulder tenderness location: anterior deltoid.  Range of Motion  Passive abduction: normal  Extension: normal  External rotation: normal  Forward  flexion: normal   Muscle Strength  Abduction: 2/5  Internal rotation: 5/5  External rotation: 5/5  Supraspinatus: 2/5  Subscapularis: 5/5  Biceps: 5/5   Tests  Apprehension: negative Hawkins test: negative Cross arm: negative Impingement: positive Drop arm: positive  Other  Erythema: absent Sensation: normal Pulse: present   Left Shoulder Exam   Tenderness  The patient is experiencing no tenderness.   Range of Motion  Active abduction: normal  Passive abduction: normal  Extension: normal  External rotation: normal  Forward flexion: 160   Muscle Strength  The patient has normal left shoulder strength.  Tests  Apprehension: negative  Other  Erythema: absent Scars: absent Sensation: normal Pulse: present        Medical decision-making  Imaging: Shoulder x-ray shows normal shoulder joint with no fracture dislocation no AC separation is noted.   Encounter Diagnoses  Name Primary?  . Pain in joint of right shoulder   . Complete tear of right rotator cuff Yes  . Anxiety state    Recommend MRI right shoulder to evaluate for surgical treatment of her rotator cuff tear  Meds ordered this encounter  Medications  . diazepam (VALIUM) 10 MG tablet    Sig: Take 1 tablet (10 mg total) by mouth once for 1 dose.    Dispense:  1 tablet    Refill:  0     [name], MD 11/09/2017 11:23 AM

## 2017-11-14 ENCOUNTER — Other Ambulatory Visit: Payer: Self-pay | Admitting: Orthopedic Surgery

## 2017-11-22 ENCOUNTER — Encounter: Payer: Self-pay | Admitting: Orthopedic Surgery

## 2017-11-22 ENCOUNTER — Telehealth: Payer: Self-pay | Admitting: Radiology

## 2017-11-22 DIAGNOSIS — M75121 Complete rotator cuff tear or rupture of right shoulder, not specified as traumatic: Secondary | ICD-10-CM

## 2017-11-22 NOTE — Telephone Encounter (Signed)
Have you discussed MRI ? What is the plan?

## 2017-11-22 NOTE — Progress Notes (Signed)
Called Winson back and gave him the results of his shoulder MRI  I am going to refer him to a shoulder specialist because of the nature of the tear which involves the supraspinatus infraspinatus subscapularis biceps tendon dislocation superior subluxation of the humeral head and large acromioclavicular bone spur

## 2017-11-22 NOTE — Progress Notes (Unsigned)
MRI report and MRI images have been reviewed  As I look at the MRI the patient has a musculotendinous junction tear of the supraspinatus tendon.  Although there is no retraction of the distal fibers which are still attached to the greater tuberosity the tear is in the watershed region of the rotator cuff, he also has a large acromioclavicular bulky arthrosis and he also has a labral tear  I will speak with him regarding this and I will refer him to a shoulder specialist as I am not confident that this tear will actually heal with the routine standard rotator cuff repair techniques

## 2017-11-22 NOTE — Telephone Encounter (Signed)
-----   Message from Uvaldo Bristle sent at 11/21/2017  5:10 PM EST ----- Regarding: FYI - re: Dr Lemmie Evens calling pt w/MRI results, [facility] Arthur Aguilar, Arthur Aguilar [[id number]] - Dr Aline Brochure relayed he will call pt w/MRI results ([facility]) / Dr Lemmie Evens has copy of report, as does front office + original placed in scanning. Pt has copy of the CD, and is aware that Dr Lemmie Evens will be callilng him.  Please follow up.

## 2017-11-22 NOTE — Telephone Encounter (Signed)
Called patient about shoulder, Dr Aline Brochure wants him to have evaluation with Dr Marlou Sa. I have asked patient about MRI disk, he does have the disk and he will bring it with him for the appointment  Lauren, Dr Aline Brochure is referring him to Dr Marlou Sa, I have faxed you the MRI from [facility] and he will bring the MRI disk with him.

## 2017-11-28 NOTE — Telephone Encounter (Signed)
Patient has picked up a copy of the MRI report, I could not get fax to go through.

## 2017-12-14 ENCOUNTER — Encounter (INDEPENDENT_AMBULATORY_CARE_PROVIDER_SITE_OTHER): Payer: Self-pay | Admitting: Orthopedic Surgery

## 2017-12-14 ENCOUNTER — Ambulatory Visit (INDEPENDENT_AMBULATORY_CARE_PROVIDER_SITE_OTHER): Payer: 59 | Admitting: Orthopedic Surgery

## 2017-12-14 DIAGNOSIS — M75121 Complete rotator cuff tear or rupture of right shoulder, not specified as traumatic: Secondary | ICD-10-CM

## 2017-12-14 NOTE — Progress Notes (Signed)
Office Visit Note   Patient: Arthur Aguilar           Date of Birth: 1961/07/30           MRN: [id number] Visit Date: 12/14/2017 Requested by: [facility] [address] [place], [place] [address] PCP: [facility] [place] [facility]  Subjective: Chief Complaint  Patient presents with  . Right Shoulder - Pain    HPI: Arthur Aguilar is a 57 year old patient with right shoulder pain.  [place] on his right shoulder a month ago landing on his arm.  He is reporting pain and weakness in the right shoulder.  He had left shoulder rotator cuff repair done in June 2018 and did well with that.  He describes right shoulder pain for over 2 years.  He likes to ride motorcycles.  He works in a sitdown type position.  He takes Tylenol for his pain.  Currently he has a tooth infection is being treated with amoxicillin.  Her dentist says that the tooth needs to be removed.  He has had an MRI scan of the right shoulder which is reviewed with the patient.  He has a tear of the infraspinatus and supraspinatus with retraction to the glenoid rim.  Not much in the moderate way of muscle atrophy by report but on my review there is a little bit of muscle atrophy present in the supraspinatus muscle belly.              ROS: All systems reviewed are negative as they relate to the chief complaint within the history of present illness.  Patient denies  fevers or chills.   Assessment & Plan: Visit Diagnoses:  1. Complete tear of right rotator cuff     Plan: Impression is right shoulder pain with abduction and forward flexion to about 90 degrees.  He is very young for any type of shoulder replacement.  He may need a reverse shoulder replacement sometime later in his life.  For now he has fairly reasonable function.  His biceps tendon is subluxated on the MRI scan.  This could be a pain generator.  I think this is likely an acute on chronic tear of the rotator cuff based on the amount of  retraction.  Plan is arthroscopy with biceps tenodesis attempt at rotator cuff tear repair and possible superior capsular reconstruction as a bridge procedure until he is older and requires reverse shoulder replacement.  Risk and benefits are discussed.  They include but not limited to infection nerve vessel damage potential for incomplete repair of the rotator cuff as well as potential for unpredictable pain relief from the superior capsular reconstruction.  I think that the superior capsular reconstruction but we will not give him improved function but could give him some pain relief..  Patient understands the risk and benefits and wishes to proceed.  All questions answered.  We need to give this procedure date about a month after he gets his tooth pulled.  I encouraged him to get the tooth pulled sooner rather than later due to the risk it poses to his total hip replacement on the left.  Follow-Up Instructions: No Follow-up on file.   Orders:  No orders of the defined types were placed in this encounter.  No orders of the defined types were placed in this encounter.     Procedures: No procedures performed   Clinical Data: No additional findings.  Objective: Vital Signs: There were no vitals taken for  this visit.  Physical Exam:   Constitutional: Patient appears well-developed HEENT:  Head: Normocephalic Eyes:EOM are normal Neck: Normal range of motion Cardiovascular: Normal rate Pulmonary/chest: Effort normal Neurologic: Patient is alert Skin: Skin is warm Psychiatric: Patient has normal mood and affect    Ortho Exam: Orthopedic exam demonstrates normal gait and alignment.  Good cervical spine range of motion.  Motor sensory function to the hand is intact.  He has excellent range of motion and strength in the left arm.  In the right arm he has forward flexion to 90 degrees and abduction 90 degrees.  He has a fairly reasonable abduction strength but weakness to infraspinatus  testing.  Subscap testing is intact.  There is no Popeye deformity in the right arm.  Radial pulses intact.  Specialty Comments:  No specialty comments available.  Imaging: No results found.   PMFS History: Patient Active Problem List   Diagnosis Date Noted  . Complete tear of left rotator cuff   . Carpal tunnel syndrome of right wrist   . Pain in left hip 03/23/2014  . Right Achilles tendinitis 12/27/2011  . History of total hip arthroplasty 09/05/2011  . Achilles bursitis or tendinitis 08/17/2011  . HIP, ARTHRITIS, DEGEN./OSTEO 11/30/2009  . BACK PAIN 11/30/2009   Past Medical History:  Diagnosis Date  . Arthritis   . GERD (gastroesophageal reflux disease)   . History of kidney stones   . Hypercholesteremia   . Scoliosis     Family History  Problem Relation Age of Onset  . Heart disease Unknown   . Arthritis Unknown   . Lung disease Unknown   . Diabetes Unknown     Past Surgical History:  Procedure Laterality Date  . CARPAL TUNNEL RELEASE Right 04/02/2015   Procedure: RIGHT CARPAL TUNNEL RELEASE;  Surgeon: [name], MD;  Location: [facility];  Service: Orthopedics;  Laterality: Right;  . dental bone implant     bottom - right jaw.  Marland Kitchen HIP SURGERY    . left hip replacement    . SHOULDER OPEN ROTATOR CUFF REPAIR Left 05/30/2017   Procedure: ROTATOR CUFF REPAIR SHOULDER OPEN;  Surgeon: [name], MD;  Location: [facility];  Service: Orthopedics;  Laterality: Left;   Social History   Occupational History  . Occupation: Diplomatic Services operational officer: [employer]  Tobacco Use  . Smoking status: Former Smoker    Packs/day: 0.50    Years: 25.00    Pack years: 12.50    Types: Cigarettes    Last attempt to quit: 03/29/2009    Years since quitting: 8.7  . Smokeless tobacco: Never Used  Substance and Sexual Activity  . Alcohol use: Yes    Comment: rarely  . Drug use: No  . Sexual activity: Yes    Birth control/protection: None

## 2017-12-20 ENCOUNTER — Telehealth: Payer: Self-pay | Admitting: Orthopedic Surgery

## 2017-12-20 NOTE — Telephone Encounter (Signed)
Patient called back, relays he has seen shoulder specialist, Dr Marlou Sa, per our referral. (Notes in [place] chart).  Patient has questions for Dr Aline Brochure, requests a call at cell [phone]

## 2017-12-20 NOTE — Telephone Encounter (Signed)
-----   Message from Uvaldo Bristle sent at 11/21/2017  5:10 PM EST ----- Regarding: FYI - re: Dr Lemmie Evens calling pt w/MRI results, [facility] Arthur Aguilar, Arthur Aguilar [[id number]] - Dr Aline Brochure relayed he will call pt w/MRI results ([facility]) / Dr Lemmie Evens has copy of report, as does front office + original placed in scanning. Pt has copy of the CD, and is aware that Dr Lemmie Evens will be callilng him.  Please follow up.

## 2018-01-04 HISTORY — PX: TOOTH EXTRACTION: SUR596

## 2018-01-16 ENCOUNTER — Other Ambulatory Visit (INDEPENDENT_AMBULATORY_CARE_PROVIDER_SITE_OTHER): Payer: Self-pay | Admitting: Orthopedic Surgery

## 2018-01-16 DIAGNOSIS — M75121 Complete rotator cuff tear or rupture of right shoulder, not specified as traumatic: Secondary | ICD-10-CM

## 2018-01-24 ENCOUNTER — Encounter (HOSPITAL_COMMUNITY): Payer: Self-pay

## 2018-01-24 NOTE — Pre-Procedure Instructions (Signed)
REES SANTISTEVAN  01/24/2018      [id number] - [place], [place] - [address] [place] [address] Phone: [phone] Fax: [phone]    Your procedure is scheduled on Thursday, January 31, 2018  Report to [facility] Admitting Entrance "A" at 5:30AM   Call this number if you have problems the morning of surgery:  [phone]   Remember:  Do not eat food or drink liquids after midnight.  Take these medicines the morning of surgery with A SIP OF WATER: Omeprazole (PRILOSEC OTC). If needed Loratadine (ALLERGY RELIEF), Acetaminophen (TYLENOL) for mild pain, or HYDROcodone-acetaminophen (NORCO) for moderate.  As of today, stop taking all Aspirins, Vitamins, Fish oils, and Herbal medications. Also stop all NSAIDS i.e. Advil, Ibuprofen, Motrin, Aleve, Anaprox, Naproxen, BC and Goody Powders.   Do not wear jewelry.  Do not wear lotions, powders, colognes, or deodorant.  Do not shave 48 hours prior to surgery.  Men may shave face and neck.  Do not bring valuables to the hospital.  [facility] is not responsible for any belongings or valuables.  Contacts, dentures or bridgework may not be worn into surgery.  Leave your suitcase in the car.  After surgery it may be brought to your room.  For patients admitted to the hospital, discharge time will be determined by your treatment team.  Patients discharged the day of surgery will not be allowed to drive home.   Special instructions:  [facility]- Preparing For Surgery  Before surgery, you can play an important role. Because skin is not sterile, your skin needs to be as free of germs as possible. You can reduce the number of germs on your skin by washing with CHG (chlorahexidine gluconate) Soap before surgery.  CHG is an antiseptic cleaner which kills germs and bonds with the skin to continue killing germs even after washing.  Please do not use if you have an allergy to CHG or  antibacterial soaps. If your skin becomes reddened/irritated stop using the CHG.  Do not shave (including legs and underarms) for at least 48 hours prior to first CHG shower. It is OK to shave your face.  Please follow these instructions carefully.   1. Shower the NIGHT BEFORE SURGERY and the MORNING OF SURGERY with CHG.   2. If you chose to wash your hair, wash your hair first as usual with your normal shampoo.  3. After you shampoo, rinse your hair and body thoroughly to remove the shampoo.  4. Use CHG as you would any other liquid soap. You can apply CHG directly to the skin and wash gently with a scrungie or a clean washcloth.   5. Apply the CHG Soap to your body ONLY FROM THE NECK DOWN.  Do not use on open wounds or open sores. Avoid contact with your eyes, ears, mouth and genitals (private parts). Wash Face and genitals (private parts)  with your normal soap.  6. Wash thoroughly, paying special attention to the area where your surgery will be performed.  7. Thoroughly rinse your body with warm water from the neck down.  8. DO NOT shower/wash with your normal soap after using and rinsing off the CHG Soap.  9. Pat yourself dry with a CLEAN TOWEL.  10. Wear CLEAN PAJAMAS to bed the night before surgery, wear comfortable clothes the morning of surgery  11. Place CLEAN SHEETS on your bed the night of your first shower and  DO NOT SLEEP WITH PETS.  Day of Surgery: Do not apply any deodorants/lotions. Please wear clean clothes to the hospital/surgery center.    Please read over the following fact sheets that you were given. Pain Booklet, Coughing and Deep Breathing and Surgical Site Infection Prevention

## 2018-01-25 ENCOUNTER — Other Ambulatory Visit: Payer: Self-pay

## 2018-01-25 ENCOUNTER — Encounter (HOSPITAL_COMMUNITY): Payer: Self-pay

## 2018-01-25 ENCOUNTER — Encounter (HOSPITAL_COMMUNITY)
Admission: RE | Admit: 2018-01-25 | Discharge: 2018-01-25 | Disposition: A | Discharge status: Home / Self Care | Payer: 59 | Source: Ambulatory Visit | Attending: Orthopedic Surgery | Admitting: Orthopedic Surgery

## 2018-01-25 DIAGNOSIS — Z01812 Encounter for preprocedural laboratory examination: Secondary | ICD-10-CM | POA: Insufficient documentation

## 2018-01-25 LAB — BASIC METABOLIC PANEL
ANION GAP: 12 (ref 5–15)
BUN: 12 mg/dL (ref 6–20)
CALCIUM: 9.9 mg/dL (ref 8.9–10.3)
CO2: 23 mmol/L (ref 22–32)
Chloride: 104 mmol/L (ref 101–111)
Creatinine, Ser: 0.93 mg/dL (ref 0.61–1.24)
GFR calc Af Amer: >60 mL/min (ref >60)
Glucose, Bld: 136 mg/dL — ABNORMAL HIGH (ref 65–99)
Potassium: 4 mmol/L (ref 3.5–5.1)
Sodium: 139 mmol/L (ref 135–145)

## 2018-01-25 LAB — CBC
HCT: 47.5 % (ref 39.0–52.0)
HEMOGLOBIN: 16.5 g/dL (ref 13.0–17.0)
MCH: 29.7 pg (ref 26.0–34.0)
MCHC: 34.7 g/dL (ref 30.0–36.0)
MCV: 85.4 fL (ref 78.0–100.0)
Platelets: 243 10*3/uL (ref 150–400)
RBC: 5.56 MIL/uL (ref 4.22–5.81)
RDW: 13.4 % (ref 11.5–15.5)
WBC: 6.5 10*3/uL (ref 4.0–10.5)

## 2018-01-30 MED ORDER — CEFAZOLIN SODIUM-DEXTROSE 2-4 GM/100ML-% IV SOLN
2.0000 g | INTRAVENOUS | Status: AC
  Administered 2018-01-31 (×2): 2 g via INTRAVENOUS
  Filled 2018-01-30: qty 100

## 2018-01-30 NOTE — Anesthesia Preprocedure Evaluation (Addendum)
Anesthesia Evaluation  Patient identified by MRN, date of birth, ID band Patient awake    Reviewed: Allergy & Precautions, NPO status , Patient's Chart, lab work & pertinent test results  Airway Mallampati: II  TM Distance: >3 FB Neck ROM: Full    Dental  (+) Teeth Intact, Missing, Dental Advisory Given   Pulmonary former smoker,    breath sounds clear to auscultation       Cardiovascular negative cardio ROS   Rhythm:Regular Rate:Normal     Neuro/Psych negative neurological ROS     GI/Hepatic Neg liver ROS, GERD  ,  Endo/Other  negative endocrine ROS  Renal/GU negative Renal ROS     Musculoskeletal  (+) Arthritis ,   Abdominal   Peds  Hematology negative hematology ROS (+)   Anesthesia Other Findings   Reproductive/Obstetrics                           Lab Results  Component Value Date   WBC 6.5 01/25/2018   HGB 16.5 01/25/2018   HCT 47.5 01/25/2018   MCV 85.4 01/25/2018   PLT 243 01/25/2018   Lab Results  Component Value Date   CREATININE 0.93 01/25/2018   BUN 12 01/25/2018   NA 139 01/25/2018   K 4.0 01/25/2018   CL 104 01/25/2018   CO2 23 01/25/2018    Anesthesia Physical Anesthesia Plan  ASA: II  Anesthesia Plan: General   Post-op Pain Management:  Regional for Post-op pain   Induction: Intravenous  PONV Risk Score and Plan: 2 and Ondansetron and Treatment may vary due to age or medical condition  Airway Management Planned: Oral ETT  Additional Equipment:   Intra-op Plan:   Post-operative Plan: Extubation in OR  Informed Consent: I have reviewed the patients History and Physical, chart, labs and discussed the procedure including the risks, benefits and alternatives for the proposed anesthesia with the patient or authorized representative who has indicated his/her understanding and acceptance.   Dental advisory given  Plan Discussed with: CRNA  Anesthesia  Plan Comments:        Anesthesia Quick Evaluation

## 2018-01-31 ENCOUNTER — Encounter (HOSPITAL_COMMUNITY): Payer: Self-pay | Admitting: Urology

## 2018-01-31 ENCOUNTER — Encounter (INDEPENDENT_AMBULATORY_CARE_PROVIDER_SITE_OTHER): Payer: Self-pay | Admitting: Orthopedic Surgery

## 2018-01-31 ENCOUNTER — Ambulatory Visit (HOSPITAL_COMMUNITY): Payer: 59 | Admitting: Certified Registered"

## 2018-01-31 ENCOUNTER — Encounter (HOSPITAL_COMMUNITY)
Admission: RE | Disposition: A | Discharge status: Home / Self Care | Payer: Self-pay | Source: Ambulatory Visit | Attending: Orthopedic Surgery

## 2018-01-31 ENCOUNTER — Ambulatory Visit (HOSPITAL_COMMUNITY)
Admission: RE | Admit: 2018-01-31 | Discharge: 2018-01-31 | Disposition: A | Discharge status: Home / Self Care | Payer: 59 | Source: Ambulatory Visit | Attending: Orthopedic Surgery | Admitting: Orthopedic Surgery

## 2018-01-31 ENCOUNTER — Ambulatory Visit (HOSPITAL_COMMUNITY): Payer: 59 | Admitting: Emergency Medicine

## 2018-01-31 DIAGNOSIS — Z87442 Personal history of urinary calculi: Secondary | ICD-10-CM | POA: Diagnosis not present

## 2018-01-31 DIAGNOSIS — M7521 Bicipital tendinitis, right shoulder: Secondary | ICD-10-CM | POA: Insufficient documentation

## 2018-01-31 DIAGNOSIS — M419 Scoliosis, unspecified: Secondary | ICD-10-CM | POA: Insufficient documentation

## 2018-01-31 DIAGNOSIS — Z87891 Personal history of nicotine dependence: Secondary | ICD-10-CM | POA: Diagnosis not present

## 2018-01-31 DIAGNOSIS — Z8249 Family history of ischemic heart disease and other diseases of the circulatory system: Secondary | ICD-10-CM | POA: Insufficient documentation

## 2018-01-31 DIAGNOSIS — M75121 Complete rotator cuff tear or rupture of right shoulder, not specified as traumatic: Secondary | ICD-10-CM | POA: Diagnosis not present

## 2018-01-31 DIAGNOSIS — Z79899 Other long term (current) drug therapy: Secondary | ICD-10-CM | POA: Insufficient documentation

## 2018-01-31 DIAGNOSIS — M75101 Unspecified rotator cuff tear or rupture of right shoulder, not specified as traumatic: Secondary | ICD-10-CM | POA: Diagnosis not present

## 2018-01-31 DIAGNOSIS — E7800 Pure hypercholesterolemia, unspecified: Secondary | ICD-10-CM | POA: Insufficient documentation

## 2018-01-31 DIAGNOSIS — K219 Gastro-esophageal reflux disease without esophagitis: Secondary | ICD-10-CM | POA: Insufficient documentation

## 2018-01-31 HISTORY — PX: SHOULDER ARTHROSCOPY WITH SUBACROMIAL DECOMPRESSION, ROTATOR CUFF REPAIR AND BICEP TENDON REPAIR: SHX5687

## 2018-01-31 SURGERY — SHOULDER ARTHROSCOPY WITH SUBACROMIAL DECOMPRESSION, ROTATOR CUFF REPAIR AND BICEP TENDON REPAIR
Anesthesia: General | Site: Shoulder | Laterality: Right

## 2018-01-31 MED ORDER — SUCCINYLCHOLINE CHLORIDE 20 MG/ML IJ SOLN
INTRAMUSCULAR | Status: AC
  Filled 2018-01-31: qty 1

## 2018-01-31 MED ORDER — DEXAMETHASONE SODIUM PHOSPHATE 10 MG/ML IJ SOLN
INTRAMUSCULAR | Status: AC
  Filled 2018-01-31: qty 1

## 2018-01-31 MED ORDER — MIDAZOLAM HCL 2 MG/2ML IJ SOLN
INTRAMUSCULAR | Status: DC | PRN
  Administered 2018-01-31 (×2): 1 mg via INTRAVENOUS

## 2018-01-31 MED ORDER — FENTANYL CITRATE (PF) 100 MCG/2ML IJ SOLN
INTRAMUSCULAR | Status: DC | PRN
  Administered 2018-01-31 (×2): 50 ug via INTRAVENOUS

## 2018-01-31 MED ORDER — FENTANYL CITRATE (PF) 100 MCG/2ML IJ SOLN: 25.0000 ug | INTRAMUSCULAR | Status: DC | PRN

## 2018-01-31 MED ORDER — DEXAMETHASONE SODIUM PHOSPHATE 10 MG/ML IJ SOLN
INTRAMUSCULAR | Status: DC | PRN
  Administered 2018-01-31: 5 mg via INTRAVENOUS

## 2018-01-31 MED ORDER — ONDANSETRON HCL 4 MG/2ML IJ SOLN
INTRAMUSCULAR | Status: DC | PRN
  Administered 2018-01-31: 4 mg via INTRAVENOUS

## 2018-01-31 MED ORDER — FENTANYL CITRATE (PF) 250 MCG/5ML IJ SOLN
INTRAMUSCULAR | Status: AC
  Filled 2018-01-31: qty 5

## 2018-01-31 MED ORDER — PROPOFOL 10 MG/ML IV BOLUS
INTRAVENOUS | Status: AC
  Filled 2018-01-31: qty 20

## 2018-01-31 MED ORDER — CHLORHEXIDINE GLUCONATE 4 % EX LIQD: 60.0000 mL | Freq: Once | CUTANEOUS | Status: DC

## 2018-01-31 MED ORDER — SODIUM CHLORIDE 0.9 % IR SOLN
Status: DC | PRN
  Administered 2018-01-31: 9000 mL
  Administered 2018-01-31 (×2): 3000 mL

## 2018-01-31 MED ORDER — PROPOFOL 10 MG/ML IV BOLUS
INTRAVENOUS | Status: DC | PRN
  Administered 2018-01-31: 200 mg via INTRAVENOUS

## 2018-01-31 MED ORDER — LIDOCAINE 2% (20 MG/ML) 5 ML SYRINGE
INTRAMUSCULAR | Status: DC | PRN
  Administered 2018-01-31: 60 mg via INTRAVENOUS

## 2018-01-31 MED ORDER — ROCURONIUM BROMIDE 10 MG/ML (PF) SYRINGE
PREFILLED_SYRINGE | INTRAVENOUS | Status: DC | PRN
  Administered 2018-01-31: 50 mg via INTRAVENOUS

## 2018-01-31 MED ORDER — ROCURONIUM BROMIDE 10 MG/ML (PF) SYRINGE
PREFILLED_SYRINGE | INTRAVENOUS | Status: AC
  Filled 2018-01-31: qty 5

## 2018-01-31 MED ORDER — SODIUM CHLORIDE 0.9 % IR SOLN
Status: DC | PRN
  Administered 2018-01-31: 12000 mL

## 2018-01-31 MED ORDER — ONDANSETRON HCL 4 MG/2ML IJ SOLN
INTRAMUSCULAR | Status: AC
  Filled 2018-01-31: qty 2

## 2018-01-31 MED ORDER — BUPIVACAINE-EPINEPHRINE (PF) 0.5% -1:200000 IJ SOLN
INTRAMUSCULAR | Status: DC | PRN
  Administered 2018-01-31: 25 mL via PERINEURAL

## 2018-01-31 MED ORDER — PROMETHAZINE HCL 25 MG/ML IJ SOLN: 6.2500 mg | INTRAMUSCULAR | Status: DC | PRN

## 2018-01-31 MED ORDER — EPINEPHRINE PF 1 MG/ML IJ SOLN
INTRAMUSCULAR | Status: AC
  Filled 2018-01-31: qty 5

## 2018-01-31 MED ORDER — LACTATED RINGERS IV SOLN
INTRAVENOUS | Status: DC | PRN
  Administered 2018-01-31 (×3): via INTRAVENOUS

## 2018-01-31 MED ORDER — MIDAZOLAM HCL 2 MG/2ML IJ SOLN
INTRAMUSCULAR | Status: AC
  Filled 2018-01-31: qty 2

## 2018-01-31 MED ORDER — LIDOCAINE 2% (20 MG/ML) 5 ML SYRINGE
INTRAMUSCULAR | Status: AC
  Filled 2018-01-31: qty 5

## 2018-01-31 SURGICAL SUPPLY — 86 items
ANCHOR PUSHLOCK PEEK 3.5X19.5 (Anchor) ×6 IMPLANT
ANCHOR SUT BIO SW 4.75X19.1 (Anchor) ×3 IMPLANT
ANCHOR SUT BIOCOMP CORKSREW (Anchor) ×3 IMPLANT
ANCHOR SUTURETAK 3X12.7 PEEK (SUTURE) ×12 IMPLANT
BLADE CUTTER GATOR 3.5 (BLADE) IMPLANT
BLADE GREAT WHITE 4.2 (BLADE) ×2 IMPLANT
BLADE GREAT WHITE 4.2MM (BLADE) ×1
BLADE SURG 11 STRL SS (BLADE) ×3 IMPLANT
BUR OVAL 4.0 (BURR) ×3 IMPLANT
BUR OVAL 6.0 (BURR) ×3 IMPLANT
CANNULA 5.75X71 LONG (CANNULA) ×3 IMPLANT
CANNULA PASSPORT BUTTON (MISCELLANEOUS) ×1 IMPLANT
CLOSURE WOUND 1/2 X4 (GAUZE/BANDAGES/DRESSINGS) ×1
COVER SURGICAL LIGHT HANDLE (MISCELLANEOUS) ×3 IMPLANT
DRAPE INCISE IOBAN 66X45 STRL (DRAPES) ×6 IMPLANT
DRAPE STERI 35X30 U-POUCH (DRAPES) ×3 IMPLANT
DRAPE U-SHAPE 47X51 STRL (DRAPES) ×6 IMPLANT
DRSG TEGADERM 4X4.75 (GAUZE/BANDAGES/DRESSINGS) ×3 IMPLANT
DURAPREP 26ML APPLICATOR (WOUND CARE) ×3 IMPLANT
ELECT REM PT RETURN 9FT ADLT (ELECTROSURGICAL) ×3
ELECTRODE REM PT RTRN 9FT ADLT (ELECTROSURGICAL) ×1 IMPLANT
FILTER STRAW FLUID ASPIR (MISCELLANEOUS) ×3 IMPLANT
FLUID NSS /IRRIG 3000 ML XXX (IV SOLUTION) ×30 IMPLANT
GAUZE SPONGE 4X4 12PLY STRL (GAUZE/BANDAGES/DRESSINGS) ×3 IMPLANT
GAUZE SPONGE 4X4 12PLY STRL LF (GAUZE/BANDAGES/DRESSINGS) ×3 IMPLANT
GAUZE XEROFORM 5X9 LF (GAUZE/BANDAGES/DRESSINGS) ×3 IMPLANT
GLOVE BIOGEL M 7.0 STRL (GLOVE) ×12 IMPLANT
GLOVE BIOGEL M STRL SZ7.5 (GLOVE) ×9 IMPLANT
GLOVE BIOGEL PI IND STRL 7.5 (GLOVE) ×3 IMPLANT
GLOVE BIOGEL PI IND STRL 8 (GLOVE) ×1 IMPLANT
GLOVE BIOGEL PI INDICATOR 7.5 (GLOVE) ×6
GLOVE BIOGEL PI INDICATOR 8 (GLOVE) ×2
GLOVE BIOGEL PI ORTHO PRO 7.5 (GLOVE) ×6
GLOVE ECLIPSE 7.0 STRL STRAW (GLOVE) ×3 IMPLANT
GLOVE PI ORTHO PRO STRL 7.5 (GLOVE) ×3 IMPLANT
GLOVE SURG ORTHO 8.0 STRL STRW (GLOVE) ×3 IMPLANT
GOWN STRL REUS W/ TWL LRG LVL3 (GOWN DISPOSABLE) ×3 IMPLANT
GOWN STRL REUS W/TWL LRG LVL3 (GOWN DISPOSABLE) ×6
IMP SYSTEM BRIDGE 4.75X19.1 (Anchor) ×3 IMPLANT
IMPL SYSTEM BRIDGE 4.75X19.1 (Anchor) ×1 IMPLANT
KIT BASIN OR (CUSTOM PROCEDURE TRAY) ×3 IMPLANT
KIT PERC INSERT 3.0 KNTLS (KITS) ×3 IMPLANT
KIT ROOM TURNOVER OR (KITS) ×3 IMPLANT
MANIFOLD NEPTUNE II (INSTRUMENTS) ×3 IMPLANT
NDL SUT 6 .5 CRC .975X.05 MAYO (NEEDLE) ×2 IMPLANT
NEEDLE HYPO 25X1 1.5 SAFETY (NEEDLE) ×3 IMPLANT
NEEDLE MAYO TAPER (NEEDLE) ×4
NEEDLE SCORPION MULTI FIRE (NEEDLE) ×3 IMPLANT
NEEDLE SPNL 18GX3.5 QUINCKE PK (NEEDLE) ×6 IMPLANT
NS IRRIG 1000ML POUR BTL (IV SOLUTION) ×3 IMPLANT
PACK SHOULDER (CUSTOM PROCEDURE TRAY) ×3 IMPLANT
PAD ARMBOARD 7.5X6 YLW CONV (MISCELLANEOUS) ×6 IMPLANT
PASSER SUT SWIFTSTITCH HIP CRT (INSTRUMENTS) ×3 IMPLANT
PASSPORT BUTTON CANNULA (MISCELLANEOUS) ×3
RESTRAINT HEAD UNIVERSAL NS (MISCELLANEOUS) ×3 IMPLANT
SET ARTHROSCOPY TUBING (MISCELLANEOUS) ×2
SET ARTHROSCOPY TUBING LN (MISCELLANEOUS) ×1 IMPLANT
SLING ARM IMMOBILIZER LRG (SOFTGOODS) IMPLANT
SPONGE LAP 4X18 X RAY DECT (DISPOSABLE) ×6 IMPLANT
STRIP CLOSURE SKIN 1/2X4 (GAUZE/BANDAGES/DRESSINGS) ×2 IMPLANT
SUCTION FRAZIER HANDLE 10FR (MISCELLANEOUS) ×2
SUCTION TUBE FRAZIER 10FR DISP (MISCELLANEOUS) ×1 IMPLANT
SUT ETHILON 3 0 PS 1 (SUTURE) ×6 IMPLANT
SUT FIBERWIRE #2 38 T-5 BLUE (SUTURE)
SUT MNCRL AB 3-0 PS2 18 (SUTURE) ×3 IMPLANT
SUT VIC AB 0 CT1 27 (SUTURE) ×2
SUT VIC AB 0 CT1 27XBRD ANBCTR (SUTURE) ×1 IMPLANT
SUT VIC AB 1 CT1 27 (SUTURE)
SUT VIC AB 1 CT1 27XBRD ANBCTR (SUTURE) IMPLANT
SUT VIC AB 2-0 CT1 27 (SUTURE) ×2
SUT VIC AB 2-0 CT1 TAPERPNT 27 (SUTURE) ×1 IMPLANT
SUT VIC AB 3-0 FS2 27 (SUTURE) ×3 IMPLANT
SUT VICRYL 0 UR6 27IN ABS (SUTURE) ×12 IMPLANT
SUTURE FIBERWR #2 38 T-5 BLUE (SUTURE) IMPLANT
SUTURE TAPE TIGERLINK 1.3MM BL (SUTURE) ×1 IMPLANT
SUTURETAPE TIGERLINK 1.3MM BL (SUTURE) ×3
SYR 20CC LL (SYRINGE) ×6 IMPLANT
SYR 3ML LL SCALE MARK (SYRINGE) ×3 IMPLANT
SYR TB 1ML LUER SLIP (SYRINGE) ×3 IMPLANT
TISSUE ARTHOFLEX THICK 3MM (Tissue) ×3 IMPLANT
TOWEL OR 17X24 6PK STRL BLUE (TOWEL DISPOSABLE) ×3 IMPLANT
TOWEL OR 17X26 10 PK STRL BLUE (TOWEL DISPOSABLE) ×3 IMPLANT
WAND HAND CNTRL MULTIVAC 90 (MISCELLANEOUS) IMPLANT
WAND STAR VAC 90 (SURGICAL WAND) ×6 IMPLANT
WATER STERILE IRR 1000ML POUR (IV SOLUTION) ×3 IMPLANT
suture anchor ×12 IMPLANT

## 2018-01-31 NOTE — Anesthesia Postprocedure Evaluation (Signed)
Anesthesia Post Note  Patient: Arthur Aguilar  Procedure(s) Performed: Right Shoulder Arthroscopy, Biceps Tenodesis, Mini Open Rotator Cuff Tear Repair, Possible Superior Capsular Reconstruction (Right Shoulder)     Patient location during evaluation: PACU Anesthesia Type: General Level of consciousness: awake and alert and oriented Pain management: pain level controlled Vital Signs Assessment: post-procedure vital signs reviewed and stable Respiratory status: spontaneous breathing, nonlabored ventilation and respiratory function stable Cardiovascular status: blood pressure returned to baseline and stable Postop Assessment: no apparent nausea or vomiting Anesthetic complications: no    Last Vitals:  Vitals:   01/31/18 1545 01/31/18 1555  BP: 129/78   Pulse: (!) 106 (!) 106  Resp: (!) 24 (!) 24  Temp:  (!) 36.2 C  SpO2: 92% 92%    Last Pain:  Vitals:   01/31/18 1545  TempSrc:   PainSc: 0-No pain                 Jernee Murtaugh A.

## 2018-01-31 NOTE — Transfer of Care (Signed)
Immediate Anesthesia Transfer of Care Note  Patient: Arthur Aguilar  Procedure(s) Performed: Right Shoulder Arthroscopy, Biceps Tenodesis, Mini Open Rotator Cuff Tear Repair, Possible Superior Capsular Reconstruction (Right Shoulder)  Patient Location: PACU  Anesthesia Type:General  Level of Consciousness: lethargic and responds to stimulation  Airway & Oxygen Therapy: Patient Spontanous Breathing and Patient connected to nasal cannula oxygen  Post-op Assessment: Report given to RN  Post vital signs: Reviewed and stable  Last Vitals:  Vitals:   01/31/18 0733 01/31/18 1340  BP:    Pulse: 94   Resp: 20   Temp:  (!) (P) 36.2 C  SpO2: 97%     Last Pain:  Vitals:   01/31/18 0543  TempSrc: Oral         Complications: No apparent anesthesia complications

## 2018-01-31 NOTE — Brief Op Note (Signed)
01/31/2018  1:35 PM  PATIENT:  Arthur Aguilar  57 y.o. male  PRE-OPERATIVE DIAGNOSIS:  Right Shoulder massive rotator Cuff Tear, Biceps tendinitis  POST-OPERATIVE DIAGNOSIS: Same PROCEDURE:  Procedure(s): Right Shoulder Arthroscopy, Biceps Tenodesis, Superior Capsular Reconstruction  SURGEON:  Surgeon(s): Marlou Sa, Tonna Corner, MD  ASSISTANT: Laure Kidney RNFA  ANESTHESIA:   general  EBL: 50 ml    Total I/O In: 2000 [I.V.:2000] Out: 600 [Urine:300; Blood:300]  BLOOD ADMINISTERED: none  DRAINS: none   LOCAL MEDICATIONS USED:  none  SPECIMEN:  No Specimen  COUNTS:  YES  TOURNIQUET:  * No tourniquets in log *  DICTATION: .Other Dictation: Dictation Number [id number]  PLAN OF CARE: Discharge to home after PACU  PATIENT DISPOSITION:  PACU - hemodynamically stable

## 2018-01-31 NOTE — H&P (Signed)
Arthur Aguilar is an 57 y.o. male.   Chief Complaint: Right shoulder pain HPI: Arthur Aguilar is a patient with right shoulder pain.  Sustained an injury last year.  MRI scan shows infraspinatus and supraspinatus retracted rotator cuff tears.  These tears do not look repairable.  He reports significant pain and functional limitation with the right arm.  He denies any instability in the right shoulder.  Past Medical History:  Diagnosis Date  . Arthritis   . GERD (gastroesophageal reflux disease)   . History of kidney stones   . Hypercholesteremia   . Scoliosis     Past Surgical History:  Procedure Laterality Date  . CARPAL TUNNEL RELEASE Right 04/02/2015   Procedure: RIGHT CARPAL TUNNEL RELEASE;  Surgeon: [name], MD;  Location: [facility];  Service: Orthopedics;  Laterality: Right;  . dental bone implant     bottom - right jaw.  Marland Kitchen HIP SURGERY    . left hip replacement    . SHOULDER OPEN ROTATOR CUFF REPAIR Left 05/30/2017   Procedure: ROTATOR CUFF REPAIR SHOULDER OPEN;  Surgeon: [name], MD;  Location: [facility];  Service: Orthopedics;  Laterality: Left;  . TOOTH EXTRACTION  01/04/2018    1 tooth    Family History  Problem Relation Age of Onset  . Heart disease Unknown   . Arthritis Unknown   . Lung disease Unknown   . Diabetes Unknown    Social History:  reports that he quit smoking about 8 years ago. His smoking use included cigarettes. He has a 12.50 pack-year smoking history. he has never used smokeless tobacco. He reports that he drinks alcohol. He reports that he does not use drugs.  Allergies: No Known Allergies  Medications Prior to Admission  Medication Sig Dispense Refill  . acetaminophen (TYLENOL) 500 MG tablet Take 1,000 mg by mouth every 6 (six) hours as needed (FOR PAIN.).    Marland Kitchen fenofibrate 160 MG tablet Take 160 mg by mouth daily.     . Flaxseed, Linseed, (FLAXSEED OIL) 1200 MG CAPS Take 1,200 mg by mouth 2 (two) times daily.    . Glucosamine HCl  (GLUCOSAMINE PO) Take 1,000 mg by mouth daily.     Marland Kitchen HYDROcodone-acetaminophen (NORCO) 5-325 MG tablet Take 1 tablet by mouth every 8 (eight) hours as needed for moderate pain. 30 tablet 0  . ibuprofen (ADVIL,MOTRIN) 800 MG tablet Take 1 tablet (800 mg total) by mouth every 8 (eight) hours as needed. (Patient taking differently: Take 800 mg by mouth every 8 (eight) hours as needed (FOR PAIN/SWELLING). ) 90 tablet 1  . loratadine (ALLERGY RELIEF) 10 MG tablet Take 10 mg by mouth daily as needed (FOR SEASONAL ALLERGIES--SPRING/SUMMER).     . Multiple Vitamin (MULTIVITAMIN WITH MINERALS) TABS tablet Take 1 tablet by mouth daily. (Men's 50+)    . NON FORMULARY Take 250 mg by mouth daily. HEMP EXTRACT    . Omega-3 Fatty Acids (FISH OIL) 1000 MG CAPS Take 2,000 mg by mouth 2 (two) times daily.    Marland Kitchen omeprazole (PRILOSEC OTC) 20 MG tablet Take 20 mg by mouth daily.    Marland Kitchen OVER THE COUNTER MEDICATION Take 500-1,000 mg by mouth at bedtime as needed (FOR SLEEP.). CBD GUMMIES    . methocarbamol (ROBAXIN) 500 MG tablet Take 1 tablet (500 mg total) by mouth 4 (four) times daily. (Patient not taking: Reported on 01/17/2018) 60 tablet 1  . methocarbamol (ROBAXIN) 750 MG tablet TAKE 1 TABLET (750 MG TOTAL) BY MOUTH  EVERY 6 (SIX) HOURS AS NEEDED FOR MUSCLE SPASMS. (Patient not taking: Reported on 01/17/2018) 28 tablet 1    No results found for this or any previous visit (from the past 48 hour(s)). No results found.  Review of Systems  Musculoskeletal: Positive for joint pain.  All other systems reviewed and are negative.   Blood pressure (!) 148/95, pulse 73, temperature 98.7 F (37.1 C), temperature source Oral, resp. rate 18, SpO2 97 %. Physical Exam  Constitutional: He appears well-developed.  HENT:  Head: Normocephalic.  Eyes: Pupils are equal, round, and reactive to light.  Neck: Normal range of motion.  Cardiovascular: Normal rate.  Respiratory: Effort normal.  Neurological: He is alert.  Skin: Skin  is warm.  Psychiatric: He has a normal mood and affect.  Examination of the right shoulder demonstrates pain with range of motion.  There is some limitation of forward flexion and abduction.  Deltoid fires.  No AC joint tenderness to direct palpation.  Shoulder stability is good to anterior and posterior testing.  O'Brien's testing equivocal.  Rotator cuff strength is markedly diminished to infraspinatus and supraspinatus testing.  Subscap muscle testing strength intact.  Assessment/Plan Impression is infraspinatus and supraspinatus tear with retraction to the glenoid rim by MRI scanning.  Patient is definitely on the young side for any type of reverse shoulder replacement.  Best option at this time would be an attempt at superior capsular reconstruction as a bridge procedure to potential later reverse shoulder replacement.  The risk and benefits are discussed including but not limited to infection nerve vessel damage failure of the procedure as well as potential need for more surgery in the near future.  Patient understands the risk and benefits.  All questions answered.  Anderson Malta, MD 01/31/2018, 7:18 AM

## 2018-01-31 NOTE — Anesthesia Procedure Notes (Signed)
Procedure Name: Intubation Date/Time: 01/31/2018 7:54 AM Performed by: Barrington Ellison, CRNA Pre-anesthesia Checklist: Patient identified, Emergency Drugs available, Suction available and Patient being monitored Patient Re-evaluated:Patient Re-evaluated prior to induction Oxygen Delivery Method: Circle System Utilized Preoxygenation: Pre-oxygenation with 100% oxygen Induction Type: IV induction Ventilation: Mask ventilation without difficulty Laryngoscope Size: Mac and 4 Grade View: Grade I Tube type: Oral Tube size: 7.5 mm Number of attempts: 1 Airway Equipment and Method: Stylet and Oral airway Placement Confirmation: ETT inserted through vocal cords under direct vision,  positive ETCO2 and breath sounds checked- equal and bilateral Secured at: 22 cm Tube secured with: Tape Dental Injury: Teeth and Oropharynx as per pre-operative assessment

## 2018-01-31 NOTE — Anesthesia Procedure Notes (Signed)
Anesthesia Regional Block: Interscalene brachial plexus block   Pre-Anesthetic Checklist: ,, timeout performed, Correct Patient, Correct Site, Correct Laterality, Correct Procedure, Correct Position, site marked, Risks and benefits discussed,  Surgical consent,  Pre-op evaluation,  At surgeon's request and post-op pain management  Laterality: Right  Prep: chloraprep       Needles:  Injection technique: Single-shot  Needle Type: Echogenic Stimulator Needle     Needle Length: 9cm  Needle Gauge: 21     Additional Needles:   Procedures:,,,, ultrasound used (permanent image in chart),,,,   Nerve Stimulator or Paresthesia:  Response: deltoid and bicep, 0.5 mA,   Additional Responses:   Narrative:  Start time: 01/31/2018 7:08 AM End time: 01/31/2018 7:15 AM Injection made incrementally with aspirations every 5 mL.  Performed by: Personally  Anesthesiologist: Suzette Battiest, MD

## 2018-02-01 ENCOUNTER — Telehealth (INDEPENDENT_AMBULATORY_CARE_PROVIDER_SITE_OTHER): Payer: Self-pay | Admitting: Orthopedic Surgery

## 2018-02-01 NOTE — Op Note (Signed)
NAME:  Arthur, Aguilar                     ACCOUNT NO.:  MEDICAL RECORD NO.:  [id number]  LOCATION:                                 FACILITY:  PHYSICIAN:  Anderson Malta, M.D.         DATE OF BIRTH:  DATE OF PROCEDURE: DATE OF DISCHARGE:                              OPERATIVE REPORT   PREOPERATIVE DIAGNOSIS:  Massive right shoulder rotator cuff tear and biceps tendinitis.  POSTOPERATIVE DIAGNOSIS:  Massive right shoulder rotator cuff tear and biceps tendinitis.  PROCEDURE:  Right shoulder arthroscopy with arthroscopic biceps tenodesis and superior capsular reconstruction using Arthrex acellular dermal graft.  SURGEON:  Anderson Malta, M.D.  ASSISTANT:  Laure Kidney, RNFA.  INDICATIONS:  Arthur Aguilar is a 57 year old patient with right shoulder pain and dysfunction following massive rotator cuff tear.  Presents for operative management after explanation of risks and benefits.  PROCEDURE IN DETAIL:  The patient was brought to the operating room, where general endotracheal anesthesia was induced.  Preoperative antibiotics were administered.  Time-out was called.  Right shoulder was examined under anesthesia and found to have good passive range of motion.  Good stability.  The right shoulder, arm, and hand were then prescribed with hydrogen peroxide, followed by alcohol and then Betadine, which was allowed to air dry.  The patient was placed in the beach chair position with the head in neutral position.  Following prepping and sterile draping, Ioban was used to cover majority of the operative field.  Time-out was called.  Posterior portal was created. Anterior portal created under direct visualization.  Diagnostic arthroscopy demonstrated irreparable massive rotator cuff tear. Infraspinatus and supraspinatus were torn and retracted.  Subscapularis, however, was intact.  Biceps tendon was released and using loop and tie procedure was tenodesed at the superior attachment of the  intra- articular subscap using 3 mm SwiveLock.  At this time, attention was then directed toward superior glenoid preparation.  Using ArthroCare wand and a bur, the superior aspect of the glenoid was prepared.  Soft tissue was removed from the 9 o'clock position to the 2 o'clock position.  Three percutaneous anchors were then placed at the 9 o'clock, 12 o'clock, and 2 o'clock position.  The sutures remained outside of the skin through their puncture incisions.  At this time also, debridement was performed around the footprint.  Three SwiveLock anchors were then placed at the anterior middle and posterior aspect of the native foot print.  This spanned a very large circumference of the footprint.  After this was performed, measurements were taken on both the glenoid circumference, the glenoid to humeral head distance posterior and anterior, as well as the circumference on the footprint.  Once these measurements were obtained, the graft was cut adding 5 mm circumferentially and 10 mm laterally.  At this time, using the Arthrex knotless suture anchors, these sutures were placed through the graft. The graft was then shuttled into the humerus and it was secured fully and safely on the glenoid.  At this time, the instruments were removed. Lateral incision was made and the deltoid was split, measured distance of 4 cm.  The lateral portion of  the graft was then secured through these medial row suture anchors by using 2 SwiveLocks.  Limbs were crossed and this gave very nice coverage.  Tension was performed with the arm in about 10 degrees of forward flexion and 30 degrees of abduction.  This gave a very nice reverse trampoline effect preventing some superior migration and subluxation of the humeral head. Coracoacromial ligament was preserved.  At this time, 1 additional suture anchor was placed anteriorly just to hold down an anterior flap of tissue.  Suture repair posteriorly was not possible  even though the lateral aspect of the graft was about 5 cm.  Still could not repair the teres to the inferior aspect of the graft.  No rotator interval sutures were placed.  All in all, the graft had good tension and very good security.  Thorough irrigation was then performed and the incisions were closed by using #1 Vicryl suture, 0 Vicryl suture, 2-0 Vicryl suture, and Monocryl on the open part of the incision.  The portals were closed using 3-0 Vicryl and 3-0 nylon.  Waterproof dressings and shoulder immobilizer were placed.  The patient tolerated the procedure well without immediate complications.  Transferred to the recovery room in stable condition.     Anderson Malta, M.D.     GSD/MEDQ  D:  01/31/2018  T:  02/01/2018  Job:  [id number]

## 2018-02-01 NOTE — Telephone Encounter (Signed)
Please advise. Thanks.  

## 2018-02-01 NOTE — Telephone Encounter (Signed)
Patient is confused on what to do as far as movement of his shoulder, he said he was still under the anesthesia when Dr. Marlou Sa was talking to him and cant remember what he said. He said he is being measured for a sling at 12 if you could give him a call back before then. # [phone]

## 2018-02-01 NOTE — Telephone Encounter (Signed)
IC s/w patient and advised  

## 2018-02-01 NOTE — Telephone Encounter (Signed)
I would sit tight in that sling for the first week okay to bring elbow out for range of motion once a day and okay to shower out of the sling but otherwise was keeping in the sling for now until return office visit

## 2018-02-04 ENCOUNTER — Telehealth (INDEPENDENT_AMBULATORY_CARE_PROVIDER_SITE_OTHER): Payer: Self-pay | Admitting: Orthopedic Surgery

## 2018-02-04 ENCOUNTER — Encounter (HOSPITAL_COMMUNITY): Payer: Self-pay | Admitting: Orthopedic Surgery

## 2018-02-04 MED ORDER — HYDROCODONE-ACETAMINOPHEN 5-325 MG PO TABS: ORAL_TABLET | ORAL | 0 refills | Status: DC

## 2018-02-04 NOTE — Telephone Encounter (Signed)
IC advised could pick up at front desk.  

## 2018-02-04 NOTE — Telephone Encounter (Signed)
Please advise. Thanks.  

## 2018-02-04 NOTE — Telephone Encounter (Signed)
Toradol is for 5 d only - try for norco 1 po q 8 # 35 pls clal thx

## 2018-02-04 NOTE — Telephone Encounter (Signed)
Patient called asked if he can get something different for pain. Patient advised the medication (Oxycodone) is to strong. Patient asked if he can take (Toradol) 10mg  tabs. The number to contact patient is [phone]

## 2018-02-07 ENCOUNTER — Encounter (INDEPENDENT_AMBULATORY_CARE_PROVIDER_SITE_OTHER): Payer: Self-pay | Admitting: Orthopedic Surgery

## 2018-02-07 ENCOUNTER — Ambulatory Visit (INDEPENDENT_AMBULATORY_CARE_PROVIDER_SITE_OTHER): Payer: 59 | Admitting: Orthopedic Surgery

## 2018-02-07 DIAGNOSIS — M75121 Complete rotator cuff tear or rupture of right shoulder, not specified as traumatic: Secondary | ICD-10-CM

## 2018-02-07 NOTE — Progress Notes (Signed)
Post-Op Visit Note   Patient: Arthur Aguilar           Date of Birth: 1961/04/09           MRN: [id number] Visit Date: 02/07/2018 PCP: [facility]   Assessment & Plan:  Chief Complaint:  Chief Complaint  Patient presents with  . Right Shoulder - Routine Post Op   Visit Diagnoses:  1. Complete tear of right rotator cuff     Plan: [name] is a patient who is now a week out right shoulder arthroscopy with superior capsular reconstruction.  On examination incisions are intact.  Taking Tylenol for pain.  Passive range of motion is good.  I am going to have him start doing the abduction splint 1 hour 3 times a day for passive range of motion.  Stay in the sling after that.  Come back in 3 weeks for clinical recheck.  Follow-Up Instructions: Return in about 3 weeks (around 02/28/2018).   Orders:  No orders of the defined types were placed in this encounter.  No orders of the defined types were placed in this encounter.   Imaging: No results found.  PMFS History: Patient Active Problem List   Diagnosis Date Noted  . Complete tear of left rotator cuff   . Carpal tunnel syndrome of right wrist   . Pain in left hip 03/23/2014  . Right Achilles tendinitis 12/27/2011  . History of total hip arthroplasty 09/05/2011  . Achilles bursitis or tendinitis 08/17/2011  . HIP, ARTHRITIS, DEGEN./OSTEO 11/30/2009  . BACK PAIN 11/30/2009   Past Medical History:  Diagnosis Date  . Arthritis   . GERD (gastroesophageal reflux disease)   . History of kidney stones   . Hypercholesteremia   . Scoliosis     Family History  Problem Relation Age of Onset  . Heart disease Unknown   . Arthritis Unknown   . Lung disease Unknown   . Diabetes Unknown     Past Surgical History:  Procedure Laterality Date  . CARPAL TUNNEL RELEASE Right 04/02/2015   Procedure: RIGHT CARPAL TUNNEL RELEASE;  Surgeon: [name], MD;  Location: [facility];  Service: Orthopedics;   Laterality: Right;  . dental bone implant     bottom - right jaw.  Marland Kitchen HIP SURGERY    . left hip replacement    . SHOULDER ARTHROSCOPY WITH SUBACROMIAL DECOMPRESSION, ROTATOR CUFF REPAIR AND BICEP TENDON REPAIR Right 01/31/2018   Procedure: Right Shoulder Arthroscopy, Biceps Tenodesis, Mini Open Rotator Cuff Tear Repair, Possible Superior Capsular Reconstruction;  Surgeon: [name], MD;  Location: [facility];  Service: Orthopedics;  Laterality: Right;  . SHOULDER OPEN ROTATOR CUFF REPAIR Left 05/30/2017   Procedure: ROTATOR CUFF REPAIR SHOULDER OPEN;  Surgeon: [name], MD;  Location: [facility];  Service: Orthopedics;  Laterality: Left;  . TOOTH EXTRACTION  01/04/2018    1 tooth   Social History   Occupational History  . Occupation: Diplomatic Services operational officer: [employer]  Tobacco Use  . Smoking status: Former Smoker    Packs/day: 0.50    Years: 25.00    Pack years: 12.50    Types: Cigarettes    Last attempt to quit: 03/29/2009    Years since quitting: 8.8  . Smokeless tobacco: Never Used  Substance and Sexual Activity  . Alcohol use: Yes    Comment: 1 beer per night  . Drug use: No  . Sexual activity: Yes  Birth control/protection: None

## 2018-03-01 ENCOUNTER — Ambulatory Visit (INDEPENDENT_AMBULATORY_CARE_PROVIDER_SITE_OTHER): Payer: 59 | Admitting: Orthopedic Surgery

## 2018-03-01 ENCOUNTER — Encounter (INDEPENDENT_AMBULATORY_CARE_PROVIDER_SITE_OTHER): Payer: Self-pay | Admitting: Orthopedic Surgery

## 2018-03-01 DIAGNOSIS — Z9889 Other specified postprocedural states: Secondary | ICD-10-CM

## 2018-03-02 ENCOUNTER — Encounter (INDEPENDENT_AMBULATORY_CARE_PROVIDER_SITE_OTHER): Payer: Self-pay | Admitting: Orthopedic Surgery

## 2018-03-02 NOTE — Progress Notes (Signed)
Post-Op Visit Note   Patient: Arthur Aguilar           Date of Birth: 31-Aug-1961           MRN: [id number] Visit Date: 03/01/2018 PCP: [facility]   Assessment & Plan:  Chief Complaint:  Chief Complaint  Patient presents with  . Right Shoulder - Routine Post Op   Visit Diagnoses:  1. S/P shoulder surgery     Plan: Arthur Aguilar is a patient who is now a month out right shoulder superior capsular reconstruction.  He is on the CPM machine about an hour and a half 3 times a day.  On examination he has pretty reasonable passive range of motion of the shoulder.  Plan is to start physical therapy in 2 weeks.  Discontinue the sling in 2 weeks.  I will see him back in 4 weeks.  I want to give that tissue about 6 weeks total to really heal into the bone on the glenoid and humeral head side.  Reinforced Arthur Aguilar today that this is a salvage operation for his shoulder that has both infraspinatus and supraspinatus tearing.  Follow-Up Instructions: Return in about 1 month (around 03/29/2018).   Orders:  Orders Placed This Encounter  Procedures  . Ambulatory referral to Physical Therapy   No orders of the defined types were placed in this encounter.   Imaging: No results found.  PMFS History: Patient Active Problem List   Diagnosis Date Noted  . Complete tear of left rotator cuff   . Carpal tunnel syndrome of right wrist   . Pain in left hip 03/23/2014  . Right Achilles tendinitis 12/27/2011  . History of total hip arthroplasty 09/05/2011  . Achilles bursitis or tendinitis 08/17/2011  . HIP, ARTHRITIS, DEGEN./OSTEO 11/30/2009  . BACK PAIN 11/30/2009   Past Medical History:  Diagnosis Date  . Arthritis   . GERD (gastroesophageal reflux disease)   . History of kidney stones   . Hypercholesteremia   . Scoliosis     Family History  Problem Relation Age of Onset  . Heart disease Unknown   . Arthritis Unknown   . Lung disease Unknown   . Diabetes Unknown       Past Surgical History:  Procedure Laterality Date  . CARPAL TUNNEL RELEASE Right 04/02/2015   Procedure: RIGHT CARPAL TUNNEL RELEASE;  Surgeon: [name], MD;  Location: [facility];  Service: Orthopedics;  Laterality: Right;  . dental bone implant     bottom - right jaw.  Marland Kitchen HIP SURGERY    . left hip replacement    . SHOULDER ARTHROSCOPY WITH SUBACROMIAL DECOMPRESSION, ROTATOR CUFF REPAIR AND BICEP TENDON REPAIR Right 01/31/2018   Procedure: Right Shoulder Arthroscopy, Biceps Tenodesis, Mini Open Rotator Cuff Tear Repair, Possible Superior Capsular Reconstruction;  Surgeon: [name], MD;  Location: [facility];  Service: Orthopedics;  Laterality: Right;  . SHOULDER OPEN ROTATOR CUFF REPAIR Left 05/30/2017   Procedure: ROTATOR CUFF REPAIR SHOULDER OPEN;  Surgeon: [name], MD;  Location: [facility];  Service: Orthopedics;  Laterality: Left;  . TOOTH EXTRACTION  01/04/2018    1 tooth   Social History   Occupational History  . Occupation: Diplomatic Services operational officer: [employer]  Tobacco Use  . Smoking status: Former Smoker    Packs/day: 0.50    Years: 25.00    Pack years: 12.50    Types: Cigarettes    Last attempt to quit:  03/29/2009    Years since quitting: 8.9  . Smokeless tobacco: Never Used  Substance and Sexual Activity  . Alcohol use: Yes    Comment: 1 beer per night  . Drug use: No  . Sexual activity: Yes    Birth control/protection: None

## 2018-03-13 ENCOUNTER — Telehealth (INDEPENDENT_AMBULATORY_CARE_PROVIDER_SITE_OTHER): Payer: Self-pay | Admitting: Orthopedic Surgery

## 2018-03-13 NOTE — Telephone Encounter (Signed)
y

## 2018-03-13 NOTE — Telephone Encounter (Signed)
Patient called wanting to know if he can quit using the Proline machine that goes under arm. [employer]  Please call patient to advise [phone]

## 2018-03-13 NOTE — Telephone Encounter (Signed)
Please advise. Thanks.  

## 2018-03-13 NOTE — Telephone Encounter (Signed)
IC s/w patient and advised  

## 2018-03-20 ENCOUNTER — Other Ambulatory Visit: Payer: Self-pay

## 2018-03-20 ENCOUNTER — Ambulatory Visit (HOSPITAL_COMMUNITY): Payer: Managed Care, Other (non HMO) | Attending: Orthopedic Surgery

## 2018-03-20 ENCOUNTER — Encounter (HOSPITAL_COMMUNITY): Payer: Self-pay

## 2018-03-20 DIAGNOSIS — M25611 Stiffness of right shoulder, not elsewhere classified: Secondary | ICD-10-CM | POA: Diagnosis present

## 2018-03-20 DIAGNOSIS — M25511 Pain in right shoulder: Secondary | ICD-10-CM | POA: Diagnosis present

## 2018-03-20 DIAGNOSIS — R29898 Other symptoms and signs involving the musculoskeletal system: Secondary | ICD-10-CM | POA: Insufficient documentation

## 2018-03-20 NOTE — Patient Instructions (Signed)

## 2018-03-20 NOTE — Therapy (Signed)
[facility] [place], [place], [address] Phone: [phone]   Fax:  [phone]  Occupational Therapy Evaluation  Patient Details  Name: Arthur Aguilar MRN: [id number] Date of Birth: 1961-03-05 Referring Provider: [name] MD   Encounter Date: 03/20/2018  OT End of Session - 03/20/18 0932    Visit Number  1    Number of Visits  12    Date for OT Re-Evaluation  05/01/18 mini reassess: 04/17/18    Authorization Type  [employer]ET[employer] NAP    Authorization Time Period  60 visit limit. 0 used.     Authorization - Visit Number  1    Authorization - Number of Visits  60    OT Start Time  0818    OT Stop Time  0850    OT Time Calculation (min)  32 min    Activity Tolerance  Patient tolerated treatment well    Behavior During Therapy  WFL for tasks assessed/performed       Past Medical History:  Diagnosis Date  . Arthritis   . GERD (gastroesophageal reflux disease)   . History of kidney stones   . Hypercholesteremia   . Scoliosis     Past Surgical History:  Procedure Laterality Date  . CARPAL TUNNEL RELEASE Right 04/02/2015   Procedure: RIGHT CARPAL TUNNEL RELEASE;  Surgeon: [name], MD;  Location: [facility];  Service: Orthopedics;  Laterality: Right;  . dental bone implant     bottom - right jaw.  Marland Kitchen HIP SURGERY    . left hip replacement    . SHOULDER ARTHROSCOPY WITH SUBACROMIAL DECOMPRESSION, ROTATOR CUFF REPAIR AND BICEP TENDON REPAIR Right 01/31/2018   Procedure: Right Shoulder Arthroscopy, Biceps Tenodesis, Mini Open Rotator Cuff Tear Repair, Possible Superior Capsular Reconstruction;  Surgeon: [name], MD;  Location: [facility];  Service: Orthopedics;  Laterality: Right;  . SHOULDER OPEN ROTATOR CUFF REPAIR Left 05/30/2017   Procedure: ROTATOR CUFF REPAIR SHOULDER OPEN;  Surgeon: [name], MD;  Location: [facility];  Service: Orthopedics;  Laterality: Left;  . TOOTH EXTRACTION  01/04/2018    1  tooth    There were no vitals filed for this visit.  Subjective Assessment - 03/20/18 0826    Subjective   S: He put a mesh in there to fix things.    Pertinent History  Patient is a 57 y/o male S/P right shoulder repair with a mesh which was completed on 01/31/18. Dr. Marlou Sa has referred patient to occupational therapy for evaluation and treatment.     Special Tests  FOTO score: 56/100    Patient Stated Goals  TO be able to use RUE as normal as possible.    Currently in Pain?  No/denies Only with movement 4/10        [facility] OT Assessment - 03/20/18 0824      Assessment   Medical Diagnosis  right shoulder surgery    Referring Provider  [name] MD    Onset Date/Surgical Date  01/21/18    Hand Dominance  Right    Next MD Visit  03/29/18    Prior Therapy  patient has not had anything for this condition.      Precautions   Precautions  None    Precaution Comments  Progress as tolerated.      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      Mobility   Mobility Status  Independent      Written Expression   Dominant Hand  Right      Vision - History   Baseline Vision  No visual deficits      Cognition   Overall Cognitive Status  Within Functional Limits for tasks assessed      Observation/Other Assessments   Focus on Therapeutic Outcomes (FOTO)   56/100      ROM / Strength   AROM / PROM / Strength  AROM;PROM;Strength      Palpation   Palpation comment  Min fascial restrictions in right upper arm, trapezius, and scapularis  region      AROM   Overall AROM Comments  Assessed seated. IR/er adducted    AROM Assessment Site  Shoulder    Right/Left Shoulder  Right    Right Shoulder Flexion  91 Degrees    Right Shoulder ABduction  74 Degrees    Right Shoulder Internal Rotation  90 Degrees    Right Shoulder External Rotation  45 Degrees      PROM   Overall PROM Comments  Assessed supine. IR/er adducted    PROM Assessment Site  Shoulder    Right/Left Shoulder  Right    Right Shoulder Flexion  137 Degrees    Right Shoulder ABduction  142 Degrees    Right Shoulder Internal Rotation  90 Degrees    Right Shoulder External Rotation  50 Degrees      Strength   Overall Strength Comments  assessed seated. IR/er adducted    Strength Assessment Site  Shoulder    Right/Left Shoulder  Right    Right Shoulder Flexion  3-/5    Right Shoulder ABduction  3-/5    Right Shoulder Internal Rotation  3/5    Right Shoulder External Rotation  3-/5                      OT Education - 03/20/18 3235    Education provided  Yes    Education Details  AA/ROM shoulder exercises.     Person(s) Educated  Patient    Methods  Explanation;Demonstration;Handout;Verbal cues    Comprehension  Returned demonstration;Verbalized understanding       OT Short Term Goals - 03/20/18 0945      OT SHORT TERM GOAL #1   Title  Patient will be educated on HEP in faciliated progress in therapy and increase shoulder mobility and strength in RUE needed for daily tasks.     Time  3    Period  Weeks    Status  New    Target Date  04/10/18      OT SHORT TERM GOAL #2   Title  Patient will increase P/ROM to [facility] in order to increase ability to complete reaching tasks at shoulder level.     Time  3    Period  Weeks    Status  New  OT SHORT TERM GOAL #3   Title  Patient will increase Right shoulder strength to 3+/5 to increase ability to complete lightweight lifting tasks.    Time  3    Period  Weeks    Status  New      OT  SHORT TERM GOAL #4   Title  Patient will decrease pain level to 3/10 when completing daily tasks using RUE.     Time  3    Period  Weeks    Status  New      OT SHORT TERM GOAL #5   Title  Patient will decrease fascial restrictions to trace amount in RUE to increase functional mobility needed for reaching tasks.     Time  3    Period  Weeks    Status  New        OT Long Term Goals - 03/20/18 0948      OT LONG TERM GOAL #1   Title  Patient will return to prior level of function using right arm normally with all other ADLs, work, leisure activities.     Time  6    Period  Weeks    Status  New    Target Date  05/01/18      OT LONG TERM GOAL #2   Title  Patient will increse RUE A/ROM to [facility] to increase ability to reach overhead and out to the side with less difficulty.     Time  6    Period  Weeks    Status  New      OT LONG TERM GOAL #3   Title  Patient will increase RUE shoulder strength to 4+/5 in order to complete overhead lifting tasks and be able to return to completing normal gym workout with less difficulty.     Time  6    Period  Weeks    Status  New      OT LONG TERM GOAL #4   Title  Patient will report a decrease pain in right shoulder of approximately 2/10 when complete tasks such as riding his motorcycle.    Time  6    Period  Weeks    Status  New            Plan - 03/20/18 3244    Clinical Impression Statement  A: patient is a 57 y/o male S/P right shoulder repair causing increased pain, fascial restrictions, and decreased ROM and strength resulting in difficulty completing daily tasks with RUE.     Occupational Profile and client history currently impacting functional performance  motivated to return to prior level of function    Occupational performance deficits (Please refer to evaluation for details):  ADL's;IADL's;Leisure    Rehab Potential  Excellent    Current Impairments/barriers affecting progress:  Amount of damage to RTC prior to surgery.      OT Frequency  2x / week    OT Duration  6 weeks    OT Treatment/Interventions  Self-care/ADL training;Therapeutic exercise;Manual Therapy;Ultrasound;Therapeutic activities;DME and/or AE instruction;Cryotherapy;Electrical Stimulation;Moist Heat;Passive range of motion;Patient/family education;Scar mobilization    Plan  P: Patient will benefit from skilled OT services to increase functional performance during daily tasks using RUE. Treatment Plan: Progress as tolerated. Myofascial release, manual stretching, AA/ROM, A/ROM, general shoulder and scapular strengthening.    Clinical Decision Making  Several treatment options, min-mod task modification necessary    Consulted and Agree with Plan of Care  Patient  Patient will benefit from skilled therapeutic intervention in order to improve the following deficits and impairments:     Visit Diagnosis: Acute pain of right shoulder - Plan: Ot plan of care cert/re-cert  Stiffness of right shoulder, not elsewhere classified - Plan: Ot plan of care cert/re-cert  Other symptoms and signs involving the musculoskeletal system - Plan: Ot plan of care cert/re-cert    Problem List Patient Active Problem List   Diagnosis Date Noted  . Complete tear of left rotator cuff   . Carpal tunnel syndrome of right wrist   . Pain in left hip 03/23/2014  . Right Achilles tendinitis 12/27/2011  . History of total hip arthroplasty 09/05/2011  . Achilles bursitis or tendinitis 08/17/2011  . HIP, ARTHRITIS, DEGEN./OSTEO 11/30/2009  . BACK PAIN 11/30/2009   Ailene Ravel, OTR/L,CBIS  [phone]  03/20/2018, 10:34 AM  [facility] [address] [place], [place], [address] Phone: [phone]   Fax:  [phone]  Name: Arthur Aguilar MRN: [id number] Date of Birth: 12-03-1961

## 2018-03-21 ENCOUNTER — Other Ambulatory Visit: Payer: Self-pay

## 2018-03-21 ENCOUNTER — Encounter (HOSPITAL_COMMUNITY): Payer: Self-pay

## 2018-03-21 ENCOUNTER — Ambulatory Visit (HOSPITAL_COMMUNITY): Payer: Managed Care, Other (non HMO)

## 2018-03-21 DIAGNOSIS — R29898 Other symptoms and signs involving the musculoskeletal system: Secondary | ICD-10-CM

## 2018-03-21 DIAGNOSIS — M25511 Pain in right shoulder: Secondary | ICD-10-CM | POA: Diagnosis not present

## 2018-03-21 DIAGNOSIS — M25611 Stiffness of right shoulder, not elsewhere classified: Secondary | ICD-10-CM

## 2018-03-21 NOTE — Therapy (Signed)
[facility] [place], [place], [address] Phone: [phone]   Fax:  [phone]  Occupational Therapy Treatment  Patient Details  Name: Arthur Aguilar MRN: [id number] Date of Birth: 01/24/1961 Referring Provider: [name] MD   Encounter Date: 03/21/2018  OT End of Session - 03/21/18 1023    Visit Number  2    Number of Visits  12    Date for OT Re-Evaluation  05/01/18 mini reassess: 04/17/18    Authorization Type  AETNA NAP    Authorization Time Period  60 visit limit. 0 used.     Authorization - Visit Number  2    Authorization - Number of Visits  64    OT Start Time  [phone]    OT Stop Time  1025    OT Time Calculation (min)  38 min    Activity Tolerance  Patient tolerated treatment well    Behavior During Therapy  WFL for tasks assessed/performed       Past Medical History:  Diagnosis Date  . Arthritis   . GERD (gastroesophageal reflux disease)   . History of kidney stones   . Hypercholesteremia   . Scoliosis     Past Surgical History:  Procedure Laterality Date  . CARPAL TUNNEL RELEASE Right 04/02/2015   Procedure: RIGHT CARPAL TUNNEL RELEASE;  Surgeon: [name], MD;  Location: [facility];  Service: Orthopedics;  Laterality: Right;  . dental bone implant     bottom - right jaw.  Marland Kitchen HIP SURGERY    . left hip replacement    . SHOULDER ARTHROSCOPY WITH SUBACROMIAL DECOMPRESSION, ROTATOR CUFF REPAIR AND BICEP TENDON REPAIR Right 01/31/2018   Procedure: Right Shoulder Arthroscopy, Biceps Tenodesis, Mini Open Rotator Cuff Tear Repair, Possible Superior Capsular Reconstruction;  Surgeon: [name], MD;  Location: [facility];  Service: Orthopedics;  Laterality: Right;  . SHOULDER OPEN ROTATOR CUFF REPAIR Left 05/30/2017   Procedure: ROTATOR CUFF REPAIR SHOULDER OPEN;  Surgeon: [name], MD;  Location: [facility];  Service: Orthopedics;  Laterality: Left;  . TOOTH EXTRACTION  01/04/2018    1  tooth    There were no vitals filed for this visit.  Subjective Assessment - 03/21/18 1001    Subjective   S: [facility] the [facility]    Currently in Pain?  No/denies         [facility] OT Assessment - 03/21/18 1002      Assessment   Medical Diagnosis  right shoulder surgery      Precautions   Precautions  None    Precaution Comments  Progress as tolerated.               OT Treatments/Exercises (OP) - 03/21/18 1002      Exercises   Exercises  Shoulder      Shoulder Exercises: Supine   Protraction  PROM;5 reps;AAROM;12 reps    Horizontal ABduction  PROM;5 reps;AAROM;12 reps    External Rotation  PROM;5 reps;AAROM;12 reps    Internal Rotation  PROM;5 reps;AAROM;12 reps    Flexion  PROM;5 reps;AAROM;12 reps    ABduction  PROM;5 reps;AAROM;12 reps      Shoulder Exercises: Standing   Protraction  --    Horizontal ABduction  AAROM;12 reps    External Rotation  AAROM;12 reps    Internal Rotation  AAROM;12 reps    Flexion  AAROM;12 reps    ABduction  AAROM;12 reps      Shoulder Exercises: Pulleys   Flexion  1 minute    ABduction  1 minute      Shoulder Exercises: ROM/Strengthening   Wall Wash  1'    Thumb Tacks  1'    Proximal Shoulder Strengthening, Seated  12X no rest breaks      Shoulder Exercises: Stretch   Wall Stretch - Flexion  2 reps 15"    Wall Stretch - ABduction  2 reps 15"      Manual Therapy   Manual Therapy  Myofascial release    Manual therapy comments  completed seperately from all other interventions    Myofascial Release  Myofascial release and manual stretching completed to right upper arm, trapezius, and scapularis region to decrease fascial restrictions and increase joint mobility in a pain free zone.              OT Education - 03/21/18 1026    Education provided  Yes    Education Details  Pt was provided with OT evaluation print out for goals review.     Person(s) Educated  Patient    Methods   Explanation;Handout    Comprehension  Verbalized understanding       OT Short Term Goals - 03/21/18 1006      OT SHORT TERM GOAL #1   Title  Patient will be educated on HEP in faciliated progress in therapy and increase shoulder mobility and strength in RUE needed for daily tasks.     Time  3    Period  Weeks    Status  On-going      OT SHORT TERM GOAL #2   Title  Patient will increase P/ROM to [facility] in order to increase ability to complete reaching tasks at shoulder level.     Time  3    Period  Weeks    Status  On-going      OT SHORT TERM GOAL #3   Title  Patient will increase Right shoulder strength to 3+/5 to increase ability to complete lightweight lifting tasks.    Time  3    Period  Weeks    Status  On-going      OT SHORT TERM GOAL #4   Title  Patient will decrease pain level to 3/10 when completing daily tasks using RUE.     Time  3    Period  Weeks    Status  On-going      OT SHORT TERM GOAL #5   Title  Patient will decrease fascial restrictions to trace amount in RUE to increase functional mobility needed for reaching tasks.     Time  3    Period  Weeks    Status  On-going        OT Long Term Goals - 03/21/18 1006      OT LONG TERM GOAL #1   Title  Patient will return to prior level of function using right arm normally with all other ADLs, work, leisure activities.     Time  6    Period  Weeks    Status  On-going      OT LONG TERM GOAL #2   Title  Patient will increse RUE A/ROM to [facility] to increase ability to reach overhead and out to the side with less difficulty.     Time  6    Period  Weeks    Status  On-going      OT  LONG TERM GOAL #3   Title  Patient will increase RUE shoulder strength to 4+/5 in order to complete overhead lifting tasks and be able to return to completing normal gym workout with less difficulty.     Time  6    Period  Weeks    Status  On-going      OT LONG TERM GOAL #4   Title  Patient will report a decrease pain in right  shoulder of approximately 2/10 when complete tasks such as riding his motorcycle.    Time  6    Period  Weeks    Status  On-going            Plan - 03/21/18 1019    Clinical Impression Statement  A: Initiated myofascial release, manual stretching, AA/ROM, pulleys and proximal shoulder strengthening this session. patient continues to have fascial restrictions in right shoulder although his ROM has greatly improved from yesterday's evaluation. VC for and technqiue.     Plan  P: Continue with AA/ROM. Increase to 15 repetitions supine. Add proximal shoulder strengthening supine.     Consulted and Agree with Plan of Care  Patient       Patient will benefit from skilled therapeutic intervention in order to improve the following deficits and impairments:  Decreased strength, Pain, Decreased range of motion, Increased fascial restrictions, Impaired UE functional use  Visit Diagnosis: Acute pain of right shoulder  Stiffness of right shoulder, not elsewhere classified  Other symptoms and signs involving the musculoskeletal system    Problem List Patient Active Problem List   Diagnosis Date Noted  . Complete tear of left rotator cuff   . Carpal tunnel syndrome of right wrist   . Pain in left hip 03/23/2014  . Right Achilles tendinitis 12/27/2011  . History of total hip arthroplasty 09/05/2011  . Achilles bursitis or tendinitis 08/17/2011  . HIP, ARTHRITIS, DEGEN./OSTEO 11/30/2009  . BACK PAIN 11/30/2009   Ailene Ravel, OTR/L,CBIS  [phone]  03/21/2018, 10:27 AM  [facility] [address] [place], [place], [address] Phone: [phone]   Fax:  [phone]  Name: Arthur Aguilar MRN: [id number] Date of Birth: 08-17-1961

## 2018-03-26 ENCOUNTER — Ambulatory Visit (HOSPITAL_COMMUNITY): Payer: Managed Care, Other (non HMO) | Admitting: Occupational Therapy

## 2018-03-27 ENCOUNTER — Other Ambulatory Visit: Payer: Self-pay

## 2018-03-27 ENCOUNTER — Encounter (HOSPITAL_COMMUNITY): Payer: Self-pay

## 2018-03-27 ENCOUNTER — Ambulatory Visit (HOSPITAL_COMMUNITY): Payer: Managed Care, Other (non HMO)

## 2018-03-27 DIAGNOSIS — M25511 Pain in right shoulder: Secondary | ICD-10-CM

## 2018-03-27 DIAGNOSIS — R29898 Other symptoms and signs involving the musculoskeletal system: Secondary | ICD-10-CM

## 2018-03-27 DIAGNOSIS — M25611 Stiffness of right shoulder, not elsewhere classified: Secondary | ICD-10-CM

## 2018-03-27 NOTE — Therapy (Signed)
[place], [place], [address] Phone: [phone]   Fax:  [phone]  Occupational Therapy Treatment  Patient Details  Name: Arthur Aguilar MRN: [id number] Date of Birth: [birth year] Referring Provider: [name] MD   Encounter Date: 03/27/2018  OT End of Session - 03/27/18 0927    Visit Number  3    Number of Visits  12    Date for OT Re-Evaluation  05/01/18 mini reassess: 04/17/18    Authorization Type  [employer] NAP    Authorization Time Period  60 visit limit. 0 used.     Authorization - Visit Number  3    Authorization - Number of Visits  83    OT Start Time  [phone]    OT Stop Time  0945    OT Time Calculation (min)  41 min    Activity Tolerance  Patient tolerated treatment well    Behavior During Therapy  WFL for tasks assessed/performed       Past Medical History:  Diagnosis Date  . Arthritis   . GERD (gastroesophageal reflux disease)   . History of kidney stones   . Hypercholesteremia   . Scoliosis     Past Surgical History:  Procedure Laterality Date  . CARPAL TUNNEL RELEASE Right 04/02/2015   Procedure: RIGHT CARPAL TUNNEL RELEASE;  Surgeon: [name], MD;  Location: [facility];  Service: Orthopedics;  Laterality: Right;  . dental bone implant     bottom - right jaw.  Marland Kitchen HIP SURGERY    . left hip replacement    . SHOULDER ARTHROSCOPY WITH SUBACROMIAL DECOMPRESSION, ROTATOR CUFF REPAIR AND BICEP TENDON REPAIR Right 01/31/2018   Procedure: Right Shoulder Arthroscopy, Biceps Tenodesis, Mini Open Rotator Cuff Tear Repair, Possible Superior Capsular Reconstruction;  Surgeon: [name], MD;  Location: [place];  Service: Orthopedics;  Laterality: Right;  . SHOULDER OPEN ROTATOR CUFF REPAIR Left 05/30/2017   Procedure: ROTATOR CUFF REPAIR SHOULDER OPEN;  Surgeon: [name], MD;  Location: [facility];  Service: Orthopedics;  Laterality: Left;  . TOOTH EXTRACTION  01/04/2018    1  tooth    There were no vitals filed for this visit.  Subjective Assessment - 03/27/18 0921    Subjective   S: When I try to do anything above my shoulder it feels like it pulls.    Currently in Pain?  No/denies         [facility] OT Assessment - 03/27/18 9323      Assessment   Medical Diagnosis  right shoulder surgery      Precautions   Precautions  None    Precaution Comments  Progress as tolerated.               OT Treatments/Exercises (OP) - 03/27/18 5573      Exercises   Exercises  Shoulder      Shoulder Exercises: Supine   Protraction  PROM;5 reps;AROM;12 reps    Horizontal ABduction  PROM;5 reps;AROM;12 reps    External Rotation  PROM;5 reps;AROM;12 reps    Internal Rotation  PROM;5 reps;AROM;12 reps    Flexion  PROM;5 reps;AROM;12 reps    ABduction  PROM;5 reps;AROM;12 reps      Shoulder Exercises: Standing   Protraction  AROM;12 reps    Horizontal ABduction  AAROM;12 reps    External Rotation  AAROM;12 reps    Internal Rotation  AAROM;12 reps    Flexion  AAROM;12 reps  ABduction  AAROM;12 reps    Extension  Theraband;12 reps    Theraband Level (Shoulder Extension)  Level 2 (Red)    Row  Theraband;12 reps    Theraband Level (Shoulder Row)  Level 2 (Red)    Retraction  Theraband;12 reps    Theraband Level (Shoulder Retraction)  Level 2 (Red)      Shoulder Exercises: ROM/Strengthening   UBE (Upper Arm Bike)  level 1 2' flexion 2' abduction pace: 4.0    Proximal Shoulder Strengthening, Supine  12X no rest breaks    Proximal Shoulder Strengthening, Seated  12X no rest breaks      Manual Therapy   Manual Therapy  Myofascial release    Manual therapy comments  completed seperately from all other interventions    Myofascial Release  Myofascial release and manual stretching completed to right upper arm, trapezius, and scapularis region to decrease fascial restrictions and increase joint mobility in a pain free zone.                OT Short  Term Goals - 03/21/18 1006      OT SHORT TERM GOAL #1   Title  Patient will be educated on HEP in faciliated progress in therapy and increase shoulder mobility and strength in RUE needed for daily tasks.     Time  3    Period  Weeks    Status  On-going      OT SHORT TERM GOAL #2   Title  Patient will increase P/ROM to [facility] in order to increase ability to complete reaching tasks at shoulder level.     Time  3    Period  Weeks    Status  On-going      OT SHORT TERM GOAL #3   Title  Patient will increase Right shoulder strength to 3+/5 to increase ability to complete lightweight lifting tasks.    Time  3    Period  Weeks    Status  On-going      OT SHORT TERM GOAL #4   Title  Patient will decrease pain level to 3/10 when completing daily tasks using RUE.     Time  3    Period  Weeks    Status  On-going      OT SHORT TERM GOAL #5   Title  Patient will decrease fascial restrictions to trace amount in RUE to increase functional mobility needed for reaching tasks.     Time  3    Period  Weeks    Status  On-going        OT Long Term Goals - 03/21/18 1006      OT LONG TERM GOAL #1   Title  Patient will return to prior level of function using right arm normally with all other ADLs, work, leisure activities.     Time  6    Period  Weeks    Status  On-going      OT LONG TERM GOAL #2   Title  Patient will increse RUE A/ROM to [facility] to increase ability to reach overhead and out to the side with less difficulty.     Time  6    Period  Weeks    Status  On-going      OT LONG TERM GOAL #3   Title  Patient will increase RUE shoulder strength to 4+/5 in order to complete overhead lifting tasks and be able to return to completing normal gym workout with  less difficulty.     Time  6    Period  Weeks    Status  On-going      OT LONG TERM GOAL #4   Title  Patient will report a decrease pain in right shoulder of approximately 2/10 when complete tasks such as riding his motorcycle.     Time  6    Period  Weeks    Status  On-going            Plan - 03/27/18 8119    Clinical Impression Statement  A: Progressed to A/ROM supine with VC for form and technique. Added UBE bike and scapular theraband exercises. Manual therapy completed to right UE to address fascial restrictions.     Plan  P: Continue with manual therapy for fascial restrictions. Add scapular theraband to HEP. (Pt may already have band from previous therapy).    Consulted and Agree with Plan of Care  Patient       Patient will benefit from skilled therapeutic intervention in order to improve the following deficits and impairments:  Decreased strength, Pain, Decreased range of motion, Increased fascial restrictions, Impaired UE functional use  Visit Diagnosis: Acute pain of right shoulder  Stiffness of right shoulder, not elsewhere classified  Other symptoms and signs involving the musculoskeletal system    Problem List Patient Active Problem List   Diagnosis Date Noted  . Complete tear of left rotator cuff   . Carpal tunnel syndrome of right wrist   . Pain in left hip 03/23/2014  . Right Achilles tendinitis 12/27/2011  . History of total hip arthroplasty 09/05/2011  . Achilles bursitis or tendinitis 08/17/2011  . HIP, ARTHRITIS, DEGEN./OSTEO 11/30/2009  . BACK PAIN 11/30/2009   Ailene Ravel, OTR/L,CBIS  [phone]  03/27/2018, 9:46 AM  [place] [address] [place], [place], [address] Phone: [phone]   Fax:  [phone]  Name: Arthur Aguilar MRN: [id number] Date of Birth: 05-02-1961

## 2018-03-28 ENCOUNTER — Encounter (HOSPITAL_COMMUNITY): Payer: Self-pay | Admitting: Occupational Therapy

## 2018-03-28 ENCOUNTER — Ambulatory Visit (HOSPITAL_COMMUNITY): Payer: Managed Care, Other (non HMO) | Admitting: Occupational Therapy

## 2018-03-28 DIAGNOSIS — R29898 Other symptoms and signs involving the musculoskeletal system: Secondary | ICD-10-CM

## 2018-03-28 DIAGNOSIS — M25511 Pain in right shoulder: Secondary | ICD-10-CM | POA: Diagnosis not present

## 2018-03-28 DIAGNOSIS — M25611 Stiffness of right shoulder, not elsewhere classified: Secondary | ICD-10-CM

## 2018-03-28 NOTE — Patient Instructions (Signed)

## 2018-03-28 NOTE — Therapy (Signed)
[place], [place], [address] Phone: [phone]   Fax:  [phone]  Occupational Therapy Treatment  Patient Details  Name: Arthur Aguilar MRN: [id number] Date of Birth: [birth year] Referring Provider: [name] MD   Encounter Date: 03/28/2018  OT End of Session - 03/28/18 0919    Visit Number  4    Number of Visits  12    Date for OT Re-Evaluation  05/01/18 mini reassess: 04/17/18    Authorization Type  [employer] NAP    Authorization Time Period  60 visit limit. 0 used.     Authorization - Visit Number  4    Authorization - Number of Visits  22    OT Start Time  1324    OT Stop Time  [phone]    OT Time Calculation (min)  40 min    Activity Tolerance  Patient tolerated treatment well    Behavior During Therapy  [facility] for tasks assessed/performed       Past Medical History:  Diagnosis Date  . Arthritis   . GERD (gastroesophageal reflux disease)   . History of kidney stones   . Hypercholesteremia   . Scoliosis     Past Surgical History:  Procedure Laterality Date  . CARPAL TUNNEL RELEASE Right 04/02/2015   Procedure: RIGHT CARPAL TUNNEL RELEASE;  Surgeon: [name], MD;  Location: [facility];  Service: Orthopedics;  Laterality: Right;  . dental bone implant     bottom - right jaw.  Marland Kitchen HIP SURGERY    . left hip replacement    . SHOULDER ARTHROSCOPY WITH SUBACROMIAL DECOMPRESSION, ROTATOR CUFF REPAIR AND BICEP TENDON REPAIR Right 01/31/2018   Procedure: Right Shoulder Arthroscopy, Biceps Tenodesis, Mini Open Rotator Cuff Tear Repair, Possible Superior Capsular Reconstruction;  Surgeon: [name], MD;  Location: [place];  Service: Orthopedics;  Laterality: Right;  . SHOULDER OPEN ROTATOR CUFF REPAIR Left 05/30/2017   Procedure: ROTATOR CUFF REPAIR SHOULDER OPEN;  Surgeon: [name], MD;  Location: [facility];  Service: Orthopedics;  Laterality: Left;  . TOOTH EXTRACTION  01/04/2018    1  tooth    There were no vitals filed for this visit.  Subjective Assessment - 03/28/18 0858    Subjective   S: It does ok until I get past halfway.     Currently in Pain?  No/denies         [facility] OT Assessment - 03/28/18 0857      Assessment   Medical Diagnosis  right shoulder surgery      Precautions   Precautions  None    Precaution Comments  Progress as tolerated.               OT Treatments/Exercises (OP) - 03/28/18 2725      Exercises   Exercises  Shoulder      Shoulder Exercises: Supine   Protraction  PROM;5 reps;AROM;12 reps    Horizontal ABduction  PROM;5 reps;AROM;12 reps    External Rotation  PROM;5 reps;AROM;12 reps    Internal Rotation  PROM;5 reps;AROM;12 reps    Flexion  PROM;5 reps;AROM;12 reps    ABduction  PROM;5 reps;AROM;12 reps      Shoulder Exercises: Standing   Protraction  AROM;12 reps    Horizontal ABduction  AAROM;12 reps    External Rotation  AAROM;12 reps    Internal Rotation  AAROM;12 reps    Flexion  AAROM;12 reps    ABduction  AAROM;12  reps    Extension  Theraband;12 reps    Theraband Level (Shoulder Extension)  Level 2 (Red)    Row  Theraband;12 reps    Theraband Level (Shoulder Row)  Level 2 (Red)    Retraction  Theraband;12 reps    Theraband Level (Shoulder Retraction)  Level 2 (Red)      Shoulder Exercises: ROM/Strengthening   UBE (Upper Arm Bike)  level 1 3' flexion 3' abduction pace: 4.0-4.5    Proximal Shoulder Strengthening, Supine  12X no rest breaks    Proximal Shoulder Strengthening, Seated  12X no rest breaks      Manual Therapy   Manual Therapy  Myofascial release    Manual therapy comments  completed seperately from all other interventions    Myofascial Release  Myofascial release and manual stretching completed to right upper arm, trapezius, and scapularis region to decrease fascial restrictions and increase joint mobility in a pain free zone.              OT Education - 03/28/18 0920    Education  provided  Yes    Education Details  scapular theraband-pt has band from prior therapy    Person(s) Educated  Patient    Methods  Explanation;Demonstration;Handout    Comprehension  Verbalized understanding;Returned demonstration       OT Short Term Goals - 03/21/18 1006      OT SHORT TERM GOAL #1   Title  Patient will be educated on HEP in faciliated progress in therapy and increase shoulder mobility and strength in RUE needed for daily tasks.     Time  3    Period  Weeks    Status  On-going      OT SHORT TERM GOAL #2   Title  Patient will increase P/ROM to [facility] in order to increase ability to complete reaching tasks at shoulder level.     Time  3    Period  Weeks    Status  On-going      OT SHORT TERM GOAL #3   Title  Patient will increase Right shoulder strength to 3+/5 to increase ability to complete lightweight lifting tasks.    Time  3    Period  Weeks    Status  On-going      OT SHORT TERM GOAL #4   Title  Patient will decrease pain level to 3/10 when completing daily tasks using RUE.     Time  3    Period  Weeks    Status  On-going      OT SHORT TERM GOAL #5   Title  Patient will decrease fascial restrictions to trace amount in RUE to increase functional mobility needed for reaching tasks.     Time  3    Period  Weeks    Status  On-going        OT Long Term Goals - 03/21/18 1006      OT LONG TERM GOAL #1   Title  Patient will return to prior level of function using right arm normally with all other ADLs, work, leisure activities.     Time  6    Period  Weeks    Status  On-going      OT LONG TERM GOAL #2   Title  Patient will increse RUE A/ROM to [facility] to increase ability to reach overhead and out to the side with less difficulty.     Time  6    Period  Weeks    Status  On-going      OT LONG TERM GOAL #3   Title  Patient will increase RUE shoulder strength to 4+/5 in order to complete overhead lifting tasks and be able to return to completing normal gym  workout with less difficulty.     Time  6    Period  Weeks    Status  On-going      OT LONG TERM GOAL #4   Title  Patient will report a decrease pain in right shoulder of approximately 2/10 when complete tasks such as riding his motorcycle.    Time  6    Period  Weeks    Status  On-going            Plan - 03/28/18 0919    Clinical Impression Statement  A: Continued with manual therapy to address fascial restrictions and improve ROM. Continued with A/ROM, continued with AA/ROM in standing to achieve end range. Added theraband to HEP, pt has band from prior therapy. Verbal cuing for form and technique during session.     Plan  P: Follow up on theraband HEP, progressing to standing A/ROM as tolerated       Patient will benefit from skilled therapeutic intervention in order to improve the following deficits and impairments:  Decreased strength, Pain, Decreased range of motion, Increased fascial restrictions, Impaired UE functional use  Visit Diagnosis: Acute pain of right shoulder  Stiffness of right shoulder, not elsewhere classified  Other symptoms and signs involving the musculoskeletal system    Problem List Patient Active Problem List   Diagnosis Date Noted  . Complete tear of left rotator cuff   . Carpal tunnel syndrome of right wrist   . Pain in left hip 03/23/2014  . Right Achilles tendinitis 12/27/2011  . History of total hip arthroplasty 09/05/2011  . Achilles bursitis or tendinitis 08/17/2011  . HIP, ARTHRITIS, DEGEN./OSTEO 11/30/2009  . BACK PAIN 11/30/2009   Guadelupe Sabin, OTR/L  [phone] 03/28/2018, 9:38 AM  [place] [address] [place], [place], [address] Phone: [phone]   Fax:  [phone]  Name: Arthur Aguilar MRN: [id number] Date of Birth: 06/05/1961

## 2018-04-02 ENCOUNTER — Ambulatory Visit (HOSPITAL_COMMUNITY): Payer: Managed Care, Other (non HMO) | Admitting: Occupational Therapy

## 2018-04-02 ENCOUNTER — Encounter (HOSPITAL_COMMUNITY): Payer: Self-pay | Admitting: Occupational Therapy

## 2018-04-02 DIAGNOSIS — M25611 Stiffness of right shoulder, not elsewhere classified: Secondary | ICD-10-CM

## 2018-04-02 DIAGNOSIS — M25511 Pain in right shoulder: Secondary | ICD-10-CM

## 2018-04-02 DIAGNOSIS — R29898 Other symptoms and signs involving the musculoskeletal system: Secondary | ICD-10-CM

## 2018-04-02 NOTE — Therapy (Signed)
[place], [place], [address] Phone: [phone]   Fax:  [phone]  Occupational Therapy Treatment  Patient Details  Name: Arthur Aguilar MRN: [id number] Date of Birth: [birth year] Referring Provider: [name] MD   Encounter Date: 04/02/2018  OT End of Session - 04/02/18 1023    Visit Number  5    Number of Visits  12    Date for OT Re-Evaluation  05/01/18 mini reassess: 04/17/18    Authorization Type  [employer] NAP    Authorization Time Period  60 visit limit. 0 used.     Authorization - Visit Number  5    Authorization - Number of Visits  15    OT Start Time  [phone]    OT Stop Time  1027    OT Time Calculation (min)  39 min    Activity Tolerance  Patient tolerated treatment well    Behavior During Therapy  WFL for tasks assessed/performed       Past Medical History:  Diagnosis Date  . Arthritis   . GERD (gastroesophageal reflux disease)   . History of kidney stones   . Hypercholesteremia   . Scoliosis     Past Surgical History:  Procedure Laterality Date  . CARPAL TUNNEL RELEASE Right 04/02/2015   Procedure: RIGHT CARPAL TUNNEL RELEASE;  Surgeon: [name], MD;  Location: [facility];  Service: Orthopedics;  Laterality: Right;  . dental bone implant     bottom - right jaw.  Marland Kitchen HIP SURGERY    . left hip replacement    . SHOULDER ARTHROSCOPY WITH SUBACROMIAL DECOMPRESSION, ROTATOR CUFF REPAIR AND BICEP TENDON REPAIR Right 01/31/2018   Procedure: Right Shoulder Arthroscopy, Biceps Tenodesis, Mini Open Rotator Cuff Tear Repair, Possible Superior Capsular Reconstruction;  Surgeon: [name], MD;  Location: [place];  Service: Orthopedics;  Laterality: Right;  . SHOULDER OPEN ROTATOR CUFF REPAIR Left 05/30/2017   Procedure: ROTATOR CUFF REPAIR SHOULDER OPEN;  Surgeon: [name], MD;  Location: [facility];  Service: Orthopedics;  Laterality: Left;  . TOOTH EXTRACTION  01/04/2018    1  tooth    There were no vitals filed for this visit.  Subjective Assessment - 04/02/18 0948    Subjective   S: I rode my bike a lot this weekend.     Currently in Pain?  No/denies         [facility] OT Assessment - 04/02/18 0948      Assessment   Medical Diagnosis  right shoulder surgery      Precautions   Precautions  None    Precaution Comments  Progress as tolerated.               OT Treatments/Exercises (OP) - 04/02/18 0949      Exercises   Exercises  Shoulder      Shoulder Exercises: Supine   Protraction  PROM;5 reps;AROM;15 reps    Horizontal ABduction  PROM;5 reps;AROM;15 reps    External Rotation  PROM;5 reps;AROM;15 reps    Internal Rotation  PROM;5 reps;AROM;15 reps    Flexion  PROM;5 reps;AROM;15 reps    ABduction  PROM;5 reps;AROM;15 reps      Shoulder Exercises: Sidelying   External Rotation  AROM;12 reps    Internal Rotation  AROM;12 reps    Flexion  AROM;12 reps    ABduction  AROM;12 reps    Other Sidelying Exercises  protraction, A/ROM, 12X    Other  Sidelying Exercises  horizontal abduction, A/ROM, 12X      Shoulder Exercises: Standing   Protraction  AROM;12 reps    Horizontal ABduction  AROM;12 reps    External Rotation  AROM;12 reps;Theraband;10 reps    Theraband Level (Shoulder External Rotation)  Level 2 (Red)    Internal Rotation  AROM;12 reps    Flexion  AROM;12 reps    ABduction  AROM;12 reps    Extension  Theraband;12 reps    Theraband Level (Shoulder Extension)  Level 2 (Red)    Row  Theraband;12 reps    Theraband Level (Shoulder Row)  Level 2 (Red)    Retraction  Theraband;12 reps    Theraband Level (Shoulder Retraction)  Level 2 (Red)      Shoulder Exercises: ROM/Strengthening   UBE (Upper Arm Bike)  level 1 3' flexion 3' abduction pace: 4.0-4.5    X to V Arms  10X    Proximal Shoulder Strengthening, Supine  12X no rest breaks    Proximal Shoulder Strengthening, Seated  12X no rest breaks      Manual Therapy   Manual  Therapy  Myofascial release    Manual therapy comments  completed seperately from all other interventions    Myofascial Release  Myofascial release and manual stretching completed to right upper arm, trapezius, and scapularis region to decrease fascial restrictions and increase joint mobility in a pain free zone.                OT Short Term Goals - 03/21/18 1006      OT SHORT TERM GOAL #1   Title  Patient will be educated on HEP in faciliated progress in therapy and increase shoulder mobility and strength in RUE needed for daily tasks.     Time  3    Period  Weeks    Status  On-going      OT SHORT TERM GOAL #2   Title  Patient will increase P/ROM to [facility] in order to increase ability to complete reaching tasks at shoulder level.     Time  3    Period  Weeks    Status  On-going      OT SHORT TERM GOAL #3   Title  Patient will increase Right shoulder strength to 3+/5 to increase ability to complete lightweight lifting tasks.    Time  3    Period  Weeks    Status  On-going      OT SHORT TERM GOAL #4   Title  Patient will decrease pain level to 3/10 when completing daily tasks using RUE.     Time  3    Period  Weeks    Status  On-going      OT SHORT TERM GOAL #5   Title  Patient will decrease fascial restrictions to trace amount in RUE to increase functional mobility needed for reaching tasks.     Time  3    Period  Weeks    Status  On-going        OT Long Term Goals - 03/21/18 1006      OT LONG TERM GOAL #1   Title  Patient will return to prior level of function using right arm normally with all other ADLs, work, leisure activities.     Time  6    Period  Weeks    Status  On-going      OT LONG TERM GOAL #2   Title  Patient will increse  RUE A/ROM to [facility] to increase ability to reach overhead and out to the side with less difficulty.     Time  6    Period  Weeks    Status  On-going      OT LONG TERM GOAL #3   Title  Patient will increase RUE shoulder strength  to 4+/5 in order to complete overhead lifting tasks and be able to return to completing normal gym workout with less difficulty.     Time  6    Period  Weeks    Status  On-going      OT LONG TERM GOAL #4   Title  Patient will report a decrease pain in right shoulder of approximately 2/10 when complete tasks such as riding his motorcycle.    Time  6    Period  Weeks    Status  On-going            Plan - 04/02/18 1021    Clinical Impression Statement  A: Continued with manual therapy to address fascial restrictions along right upper arm and deltoid regions. Added sidelying A/ROM this session, pt able to progress to standing A/ROM as well. Verbal cuing to activate musculature instead of using momentum for exercise completion. Added er with theraband, cuing for form and technique. Pt reports HEP is going well.     Plan  P: Continue with A/ROM and update HEP, add overhead lacing       Patient will benefit from skilled therapeutic intervention in order to improve the following deficits and impairments:  Decreased strength, Pain, Decreased range of motion, Increased fascial restrictions, Impaired UE functional use  Visit Diagnosis: Acute pain of right shoulder  Stiffness of right shoulder, not elsewhere classified  Other symptoms and signs involving the musculoskeletal system    Problem List Patient Active Problem List   Diagnosis Date Noted  . Complete tear of left rotator cuff   . Carpal tunnel syndrome of right wrist   . Pain in left hip 03/23/2014  . Right Achilles tendinitis 12/27/2011  . History of total hip arthroplasty 09/05/2011  . Achilles bursitis or tendinitis 08/17/2011  . HIP, ARTHRITIS, DEGEN./OSTEO 11/30/2009  . BACK PAIN 11/30/2009   Guadelupe Sabin, OTR/L  [phone] 04/02/2018, 10:27 AM  [place] [address] [place], [place], [address] Phone: [phone]   Fax:  [phone]  Name: JOZSEF WESCOAT MRN:  [id number] Date of Birth: [birth year]

## 2018-04-04 ENCOUNTER — Ambulatory Visit (HOSPITAL_COMMUNITY): Payer: 59 | Attending: Orthopedic Surgery

## 2018-04-04 DIAGNOSIS — M25511 Pain in right shoulder: Secondary | ICD-10-CM | POA: Diagnosis present

## 2018-04-04 DIAGNOSIS — R29898 Other symptoms and signs involving the musculoskeletal system: Secondary | ICD-10-CM

## 2018-04-04 DIAGNOSIS — M25611 Stiffness of right shoulder, not elsewhere classified: Secondary | ICD-10-CM | POA: Insufficient documentation

## 2018-04-04 NOTE — Patient Instructions (Signed)

## 2018-04-04 NOTE — Therapy (Signed)
[facility] [place], [place], [address] Phone: [phone]   Fax:  [phone]  Occupational Therapy Treatment  Patient Details  Name: Arthur Aguilar MRN: [id number] Date of Birth: [birth year] Referring Provider: [name] MD   Encounter Date: 04/04/2018  OT End of Session - 04/04/18 1026    Visit Number  6    Number of Visits  12    Date for OT Re-Evaluation  05/01/18 mini reassess: 04/17/18    Authorization Type  [employer]ET[employer] NAP    Authorization Time Period  60 visit limit. 0 used.     Authorization - Visit Number  6    Authorization - Number of Visits  18    OT Start Time  [phone]    OT Stop Time  1030    OT Time Calculation (min)  40 min    Activity Tolerance  Patient tolerated treatment well    Behavior During Therapy  WFL for tasks assessed/performed       Past Medical History:  Diagnosis Date  . Arthritis   . GERD (gastroesophageal reflux disease)   . History of kidney stones   . Hypercholesteremia   . Scoliosis     Past Surgical History:  Procedure Laterality Date  . CARPAL TUNNEL RELEASE Right 04/02/2015   Procedure: RIGHT CARPAL TUNNEL RELEASE;  Surgeon: [name], MD;  Location: [facility];  Service: Orthopedics;  Laterality: Right;  . dental bone implant     bottom - right jaw.  Marland Kitchen HIP SURGERY    . left hip replacement    . SHOULDER ARTHROSCOPY WITH SUBACROMIAL DECOMPRESSION, ROTATOR CUFF REPAIR AND BICEP TENDON REPAIR Right 01/31/2018   Procedure: Right Shoulder Arthroscopy, Biceps Tenodesis, Mini Open Rotator Cuff Tear Repair, Possible Superior Capsular Reconstruction;  Surgeon: [name], MD;  Location: [facility];  Service: Orthopedics;  Laterality: Right;  . SHOULDER OPEN ROTATOR CUFF REPAIR Left 05/30/2017   Procedure: ROTATOR CUFF REPAIR SHOULDER OPEN;  Surgeon: [name], MD;  Location: [facility];  Service: Orthopedics;  Laterality: Left;  . TOOTH EXTRACTION  01/04/2018    1 tooth     There were no vitals filed for this visit.      [facility] OT Assessment - 04/04/18 0953      Assessment   Medical Diagnosis  right shoulder surgery      Precautions   Precautions  None    Precaution Comments  Progress as tolerated.      AROM   Overall AROM Comments  Assessed seated. IR/er abducted    AROM Assessment Site  Shoulder    Right/Left Shoulder  Right    Right Shoulder Flexion  150 Degrees previous: 91    Right Shoulder ABduction  158 Degrees previous: 74    Right Shoulder Internal Rotation  80 Degrees previous: 90 (adducted)    Right Shoulder External Rotation  75 Degrees previous: 45 (adducted)      PROM   Overall PROM Comments  Assessed supine. IR/er abducted    PROM Assessment Site  Shoulder    Right/Left Shoulder  Right    Right Shoulder Flexion  145 Degrees previous: 137    Right Shoulder ABduction  180 Degrees previous: 142    Right Shoulder Internal Rotation  90 Degrees previous: same    Right Shoulder External Rotation  65 Degrees previous: 50 (adducted)      Strength   Overall Strength Comments  assessed seated. IR/er adducted  Strength Assessment Site  Shoulder    Right/Left Shoulder  Right    Right Shoulder Flexion  3/5 previous: 3-/5    Right Shoulder ABduction  3/5 previous: 3-/5    Right Shoulder Internal Rotation  5/5 previous: 3-/5    Right Shoulder External Rotation  3/5 previous: 3-/5               OT Treatments/Exercises (OP) - 04/04/18 1005      Exercises   Exercises  Shoulder      Shoulder Exercises: Supine   Protraction  PROM;5 reps;Strengthening;12 reps    Protraction Weight (lbs)  2    Horizontal ABduction  PROM;5 reps;Strengthening;12 reps    Horizontal ABduction Weight (lbs)  2    External Rotation  PROM;5 reps;Strengthening;12 reps    External Rotation Weight (lbs)  2    Internal Rotation  PROM;5 reps;Strengthening;12 reps    Internal Rotation Weight (lbs)  2    Flexion  PROM;5 reps;Strengthening;12 reps     Shoulder Flexion Weight (lbs)  2    ABduction  PROM;5 reps;Strengthening;12 reps    Shoulder ABduction Weight (lbs)  2      Shoulder Exercises: Standing   Protraction  Strengthening;12 reps    Protraction Weight (lbs)  1    Horizontal ABduction  Strengthening;12 reps    Horizontal ABduction Weight (lbs)  1    External Rotation  Strengthening;12 reps    External Rotation Weight (lbs)  1    Internal Rotation  Strengthening;12 reps    Internal Rotation Weight (lbs)  1    Flexion  Strengthening;12 reps    Shoulder Flexion Weight (lbs)  1    ABduction  AROM;12 reps      Shoulder Exercises: ROM/Strengthening   UBE (Upper Arm Bike)  Level 2 3' forward 3' reverse pace: 4.0    Over Head Lace  2'    Proximal Shoulder Strengthening, Supine  12X with 2# no rest breaks    Proximal Shoulder Strengthening, Seated  12X no rest breaks      Manual Therapy   Manual Therapy  Myofascial release    Manual therapy comments  completed seperately from all other interventions    Myofascial Release  Myofascial release and manual stretching completed to right upper arm, trapezius, and scapularis region to decrease fascial restrictions and increase joint mobility in a pain free zone.              OT Education - 04/04/18 1025    Education provided  Yes    Education Details  strengthening standing with 1# weights except abduction (continue with A/ROM). Pt was given a print out of measurements taken today for MD appointment tomorrow.     Person(s) Educated  Patient    Methods  Explanation;Demonstration;Handout;Verbal cues    Comprehension  Returned demonstration;Verbalized understanding       OT Short Term Goals - 03/21/18 1006      OT SHORT TERM GOAL #1   Title  Patient will be educated on HEP in faciliated progress in therapy and increase shoulder mobility and strength in RUE needed for daily tasks.     Time  3    Period  Weeks    Status  On-going      OT SHORT TERM GOAL #2   Title  Patient  will increase P/ROM to [facility] in order to increase ability to complete reaching tasks at shoulder level.     Time  3  Period  Weeks    Status  On-going      OT SHORT TERM GOAL #3   Title  Patient will increase Right shoulder strength to 3+/5 to increase ability to complete lightweight lifting tasks.    Time  3    Period  Weeks    Status  On-going      OT SHORT TERM GOAL #4   Title  Patient will decrease pain level to 3/10 when completing daily tasks using RUE.     Time  3    Period  Weeks    Status  On-going      OT SHORT TERM GOAL #5   Title  Patient will decrease fascial restrictions to trace amount in RUE to increase functional mobility needed for reaching tasks.     Time  3    Period  Weeks    Status  On-going        OT Long Term Goals - 03/21/18 1006      OT LONG TERM GOAL #1   Title  Patient will return to prior level of function using right arm normally with all other ADLs, work, leisure activities.     Time  6    Period  Weeks    Status  On-going      OT LONG TERM GOAL #2   Title  Patient will increse RUE A/ROM to [facility] to increase ability to reach overhead and out to the side with less difficulty.     Time  6    Period  Weeks    Status  On-going      OT LONG TERM GOAL #3   Title  Patient will increase RUE shoulder strength to 4+/5 in order to complete overhead lifting tasks and be able to return to completing normal gym workout with less difficulty.     Time  6    Period  Weeks    Status  On-going      OT LONG TERM GOAL #4   Title  Patient will report a decrease pain in right shoulder of approximately 2/10 when complete tasks such as riding his motorcycle.    Time  6    Period  Weeks    Status  On-going            Plan - 04/04/18 1058    Clinical Impression Statement  A: Measurements taken for MD appointment tomorrow. patient provided with a copy. Min fascial restrictions noted this date located only in right upper trapezius. Focused on strengthening  using 2# weight supine and 1# weight standing. Unable to complete abduction with weight standing. HEP was updated for strengthening exercises. VC for form and technique.     Plan  P: Follow up on MD apointment. Continue with X to V arms. Add prone strengthening.     Consulted and Agree with Plan of Care  Patient       Patient will benefit from skilled therapeutic intervention in order to improve the following deficits and impairments:  Decreased strength, Pain, Decreased range of motion, Increased fascial restrictions, Impaired UE functional use  Visit Diagnosis: Acute pain of right shoulder  Stiffness of right shoulder, not elsewhere classified  Other symptoms and signs involving the musculoskeletal system    Problem List Patient Active Problem List   Diagnosis Date Noted  . Complete tear of left rotator cuff   . Carpal tunnel syndrome of right wrist   . Pain in left hip 03/23/2014  .  Right Achilles tendinitis 12/27/2011  . History of total hip arthroplasty 09/05/2011  . Achilles bursitis or tendinitis 08/17/2011  . HIP, ARTHRITIS, DEGEN./OSTEO 11/30/2009  . BACK PAIN 11/30/2009   Ailene Ravel, OTR/L,CBIS  [phone]  04/04/2018, 11:01 AM  [facility] [address] [place], [place], [address] Phone: [phone]   Fax:  [phone]  Name: Arthur Aguilar MRN: [id number] Date of Birth: Sep 18, 1961

## 2018-04-05 ENCOUNTER — Ambulatory Visit (INDEPENDENT_AMBULATORY_CARE_PROVIDER_SITE_OTHER): Payer: 59 | Admitting: Orthopedic Surgery

## 2018-04-05 ENCOUNTER — Encounter (INDEPENDENT_AMBULATORY_CARE_PROVIDER_SITE_OTHER): Payer: Self-pay | Admitting: Orthopedic Surgery

## 2018-04-05 DIAGNOSIS — Z9889 Other specified postprocedural states: Secondary | ICD-10-CM

## 2018-04-06 ENCOUNTER — Encounter (INDEPENDENT_AMBULATORY_CARE_PROVIDER_SITE_OTHER): Payer: Self-pay | Admitting: Orthopedic Surgery

## 2018-04-06 NOTE — Progress Notes (Signed)
Post-Op Visit Note   Patient: Arthur Aguilar           Date of Birth: 12-02-1961           MRN: [id number] Visit Date: 04/05/2018 PCP: [facility]   Assessment & Plan:  Chief Complaint:  Chief Complaint  Patient presents with  . Right Shoulder - Routine Post Op   Visit Diagnoses:  1. S/P shoulder surgery     Plan: Arthur Aguilar is a patient with peer capsular reconstruction done 2 months ago.  He is in physical therapy 2 times a week.  On examination he actually has pretty good active range of motion above shoulder level.  Pain is improving.  Plan at this time is to continue therapy but no overhead strengthening for 2 months.  I described to him how this is a salvage procedure.  Described and talk with him about how his rotator cuff is gone and nonfunctional and how this is a salvage procedure basically for pain control and relief prior to acquiring reverse shoulder replacement.  I will see him back in 2 months for clinical recheck  Follow-Up Instructions: Return in about 8 weeks (around 05/31/2018).   Orders:  No orders of the defined types were placed in this encounter.  No orders of the defined types were placed in this encounter.   Imaging: No results found.  PMFS History: Patient Active Problem List   Diagnosis Date Noted  . Complete tear of left rotator cuff   . Carpal tunnel syndrome of right wrist   . Pain in left hip 03/23/2014  . Right Achilles tendinitis 12/27/2011  . History of total hip arthroplasty 09/05/2011  . Achilles bursitis or tendinitis 08/17/2011  . HIP, ARTHRITIS, DEGEN./OSTEO 11/30/2009  . BACK PAIN 11/30/2009   Past Medical History:  Diagnosis Date  . Arthritis   . GERD (gastroesophageal reflux disease)   . History of kidney stones   . Hypercholesteremia   . Scoliosis     Family History  Problem Relation Age of Onset  . Heart disease Unknown   . Arthritis Unknown   . Lung disease Unknown   . Diabetes Unknown     Past Surgical History:  Procedure Laterality Date  . CARPAL TUNNEL RELEASE Right 04/02/2015   Procedure: RIGHT CARPAL TUNNEL RELEASE;  Surgeon: [name], MD;  Location: [facility];  Service: Orthopedics;  Laterality: Right;  . dental bone implant     bottom - right jaw.  Marland Kitchen HIP SURGERY    . left hip replacement    . SHOULDER ARTHROSCOPY WITH SUBACROMIAL DECOMPRESSION, ROTATOR CUFF REPAIR AND BICEP TENDON REPAIR Right 01/31/2018   Procedure: Right Shoulder Arthroscopy, Biceps Tenodesis, Mini Open Rotator Cuff Tear Repair, Possible Superior Capsular Reconstruction;  Surgeon: [name], MD;  Location: [facility];  Service: Orthopedics;  Laterality: Right;  . SHOULDER OPEN ROTATOR CUFF REPAIR Left 05/30/2017   Procedure: ROTATOR CUFF REPAIR SHOULDER OPEN;  Surgeon: [name], MD;  Location: [facility];  Service: Orthopedics;  Laterality: Left;  . TOOTH EXTRACTION  01/04/2018    1 tooth   Social History   Occupational History  . Occupation: Diplomatic Services operational officer: [employer]  Tobacco Use  . Smoking status: Former Smoker    Packs/day: 0.50    Years: 25.00    Pack years: 12.50    Types: Cigarettes    Last attempt to quit: 03/29/2009    Years since quitting:  9.0  . Smokeless tobacco: Never Used  Substance and Sexual Activity  . Alcohol use: Yes    Comment: 1 beer per night  . Drug use: No  . Sexual activity: Yes    Birth control/protection: None

## 2018-04-09 ENCOUNTER — Ambulatory Visit (HOSPITAL_COMMUNITY): Payer: 59

## 2018-04-09 ENCOUNTER — Telehealth (HOSPITAL_COMMUNITY): Payer: Self-pay

## 2018-04-09 NOTE — Telephone Encounter (Signed)
04/09/18  pt called and said he couldn't come this morning and we rescehduled the appt for 5/10

## 2018-04-11 ENCOUNTER — Encounter (HOSPITAL_COMMUNITY): Payer: Self-pay | Admitting: Occupational Therapy

## 2018-04-11 ENCOUNTER — Ambulatory Visit (HOSPITAL_COMMUNITY): Payer: 59 | Admitting: Occupational Therapy

## 2018-04-11 DIAGNOSIS — M25611 Stiffness of right shoulder, not elsewhere classified: Secondary | ICD-10-CM

## 2018-04-11 DIAGNOSIS — M25511 Pain in right shoulder: Secondary | ICD-10-CM | POA: Diagnosis not present

## 2018-04-11 DIAGNOSIS — R29898 Other symptoms and signs involving the musculoskeletal system: Secondary | ICD-10-CM

## 2018-04-11 NOTE — Therapy (Addendum)
[place], [place], [address] Phone: [phone]   Fax:  [phone]  Occupational Therapy Treatment  Patient Details  Name: Arthur Aguilar MRN: [id number] Date of Birth: 01/14/1961 Referring Provider: [name] MD   Encounter Date: 04/11/2018  OT End of Session - 04/11/18 1004    Visit Number  7    Number of Visits  12    Date for OT Re-Evaluation  05/01/18 mini reassess: 04/17/18    Authorization Type  [employer]ET[employer] NAP    Authorization Time Period  60 visit limit. 0 used.     Authorization - Visit Number  7    Authorization - Number of Visits OT Start Time OT Stop Time OT Time Calculation (min)  60  0946 1029 43 min   Activity Tolerance  Patient tolerated treatment well    Behavior During Therapy  WFL for tasks assessed/performed       Past Medical History:  Diagnosis Date  . Arthritis   . GERD (gastroesophageal reflux disease)   . History of kidney stones   . Hypercholesteremia   . Scoliosis     Past Surgical History:  Procedure Laterality Date  . CARPAL TUNNEL RELEASE Right 04/02/2015   Procedure: RIGHT CARPAL TUNNEL RELEASE;  Surgeon: [name], MD;  Location: [facility];  Service: Orthopedics;  Laterality: Right;  . dental bone implant     bottom - right jaw.  Marland Kitchen HIP SURGERY    . left hip replacement    . SHOULDER ARTHROSCOPY WITH SUBACROMIAL DECOMPRESSION, ROTATOR CUFF REPAIR AND BICEP TENDON REPAIR Right 01/31/2018   Procedure: Right Shoulder Arthroscopy, Biceps Tenodesis, Mini Open Rotator Cuff Tear Repair, Possible Superior Capsular Reconstruction;  Surgeon: [name], MD;  Location: [facility];  Service: Orthopedics;  Laterality: Right;  . SHOULDER OPEN ROTATOR CUFF REPAIR Left 05/30/2017   Procedure: ROTATOR CUFF REPAIR SHOULDER OPEN;  Surgeon: [name], MD;  Location: [facility];  Service: Orthopedics;  Laterality: Left;  . TOOTH EXTRACTION  01/04/2018    1 tooth     There were no vitals filed for this visit.  Subjective Assessment - 04/11/18 0948    Subjective   S: The doctor said I wouldn't ever have much overhead strength since he took out that tendon.     Currently in Pain?  No/denies         [facility] OT Assessment - 04/11/18 0948      Assessment   Medical Diagnosis  right shoulder surgery      Precautions   Precautions  None    Precaution Comments  Progress as tolerated.               OT Treatments/Exercises (OP) - 04/11/18 0948      Exercises   Exercises  Shoulder      Shoulder Exercises: Supine   Protraction  PROM;5 reps;Strengthening;12 reps    Protraction Weight (lbs)  2    Horizontal ABduction  PROM;5 reps;Strengthening;12 reps    Horizontal ABduction Weight (lbs)  2    External Rotation  PROM;5 reps;Strengthening;12 reps    External Rotation Weight (lbs)  2    Internal Rotation  PROM;5 reps;Strengthening;12 reps    Internal Rotation Weight (lbs)  2    Flexion  PROM;5 reps;Strengthening;12 reps    Shoulder Flexion Weight (lbs)  2    ABduction  PROM;5 reps;Strengthening;12 reps    Shoulder ABduction Weight (lbs)  2  Shoulder Exercises: Prone   Retraction  AROM;10 reps    Flexion  AROM;10 reps    Other Prone Exercises  protraction, A/ROM, 10X    Other Prone Exercises  scaption, A/ROM, 10X      Shoulder Exercises: Sidelying   External Rotation  Strengthening;12 reps    External Rotation Weight (lbs)  2    Internal Rotation  Strengthening;12 reps    Internal Rotation Weight (lbs)  2    Flexion  Strengthening;12 reps    Flexion Weight (lbs)  2    ABduction  Strengthening;12 reps    ABduction Weight (lbs)  2    Other Sidelying Exercises  protraction, 12X, 2#    Other Sidelying Exercises  horizontal abduction, 12X, 2#      Shoulder Exercises: Standing   Protraction  Theraband;10 reps    Theraband Level (Shoulder Protraction)  Level 4 (Blue)    Horizontal ABduction  Theraband;10 reps    Theraband Level  (Shoulder Horizontal ABduction)  Level 3 (Green)    External Rotation  Theraband;10 reps    Theraband Level (Shoulder External Rotation)  Level 2 (Red)    Flexion  Theraband;10 reps    Theraband Level (Shoulder Flexion)  Level 2 (Red)    ABduction  Theraband;10 reps    Theraband Level (Shoulder ABduction)  Level 2 (Red)      Shoulder Exercises: ROM/Strengthening   UBE (Upper Arm Bike)  Level 2 3' forward 3' reverse pace: 4.0    X to V Arms  10X    Proximal Shoulder Strengthening, Supine  12X with 2# no rest breaks    Ball on Wall  1' flexion 1' abduction      Manual Therapy   Manual Therapy  Myofascial release    Manual therapy comments  completed seperately from all other interventions    Myofascial Release  Myofascial release and manual stretching completed to right upper arm, trapezius, and scapularis region to decrease fascial restrictions and increase joint mobility in a pain free zone.                OT Short Term Goals - 03/21/18 1006      OT SHORT TERM GOAL #1   Title  Patient will be educated on HEP in faciliated progress in therapy and increase shoulder mobility and strength in RUE needed for daily tasks.     Time  3    Period  Weeks    Status  On-going      OT SHORT TERM GOAL #2   Title  Patient will increase P/ROM to [facility] in order to increase ability to complete reaching tasks at shoulder level.     Time  3    Period  Weeks    Status  On-going      OT SHORT TERM GOAL #3   Title  Patient will increase Right shoulder strength to 3+/5 to increase ability to complete lightweight lifting tasks.    Time  3    Period  Weeks    Status  On-going      OT SHORT TERM GOAL #4   Title  Patient will decrease pain level to 3/10 when completing daily tasks using RUE.     Time  3    Period  Weeks    Status  On-going      OT SHORT TERM GOAL #5   Title  Patient will decrease fascial restrictions to trace amount in RUE to increase functional mobility needed for  reaching  tasks.     Time  3    Period  Weeks    Status  On-going        OT Long Term Goals - 03/21/18 1006      OT LONG TERM GOAL #1   Title  Patient will return to prior level of function using right arm normally with all other ADLs, work, leisure activities.     Time  6    Period  Weeks    Status  On-going      OT LONG TERM GOAL #2   Title  Patient will increse RUE A/ROM to [facility] to increase ability to reach overhead and out to the side with less difficulty.     Time  6    Period  Weeks    Status  On-going      OT LONG TERM GOAL #3   Title  Patient will increase RUE shoulder strength to 4+/5 in order to complete overhead lifting tasks and be able to return to completing normal gym workout with less difficulty.     Time  6    Period  Weeks    Status  On-going      OT LONG TERM GOAL #4   Title  Patient will report a decrease pain in right shoulder of approximately 2/10 when complete tasks such as riding his motorcycle.    Time  6    Period  Weeks    Status  On-going            Plan - 04/11/18 1004    Clinical Impression Statement  A: Pt reports MD is pleased with progress, states he will not gain much overhead strength due to severity of shoulder issues and removal of portion of rotator cuff. 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 Continued with strengthening, limiting standing to 50% range per MD orders to hold overhead strengthening for 2 months. Added prone A/ROM, ball on wall, and theraband strengthening in standing today. During theraband strengthening only striving to reach 50% range with flexion and abduction. Verbal cuing for form and technique.     Plan  P: continue with strengthening and stabilization, add horizontal abduction in prone and add weights in prone if pt able to complete with good form       Patient will benefit from skilled therapeutic intervention in order to improve the following deficits and impairments:   Decreased strength, Pain, Decreased range of motion, Increased fascial restrictions, Impaired UE functional use  Visit Diagnosis: Acute pain of right shoulder  Stiffness of right shoulder, not elsewhere classified  Other symptoms and signs involving the musculoskeletal system    Problem List Patient Active Problem List   Diagnosis Date Noted  . Complete tear of left rotator cuff   . Carpal tunnel syndrome of right wrist   . Pain in left hip 03/23/2014  . Right Achilles tendinitis 12/27/2011  . History of total hip arthroplasty 09/05/2011  . Achilles bursitis or tendinitis 08/17/2011  . HIP, ARTHRITIS, DEGEN./OSTEO 11/30/2009  . BACK PAIN 11/30/2009   Guadelupe Sabin, OTR/L  [phone] 04/11/2018, 11:12 AM  [facility] [address] [place], [place], [address] Phone: [phone]   Fax:  [phone]  Name: Arthur Aguilar MRN: [id number] Date of Birth: 16-Nov-1961

## 2018-04-12 ENCOUNTER — Ambulatory Visit (HOSPITAL_COMMUNITY): Payer: 59

## 2018-04-12 ENCOUNTER — Encounter (HOSPITAL_COMMUNITY): Payer: Self-pay

## 2018-04-12 ENCOUNTER — Other Ambulatory Visit: Payer: Self-pay

## 2018-04-12 DIAGNOSIS — M25511 Pain in right shoulder: Secondary | ICD-10-CM

## 2018-04-12 DIAGNOSIS — R29898 Other symptoms and signs involving the musculoskeletal system: Secondary | ICD-10-CM

## 2018-04-12 DIAGNOSIS — M25611 Stiffness of right shoulder, not elsewhere classified: Secondary | ICD-10-CM

## 2018-04-12 NOTE — Therapy (Signed)
[place], [place], [address] Phone: [phone]   Fax:  [phone]  Occupational Therapy Treatment  Patient Details  Name: Arthur Aguilar MRN: [id number] Date of Birth: 1961/01/31 Referring Provider: [name] MD   Encounter Date: 04/12/2018  OT End of Session - 04/12/18 0856    Visit Number  8    Number of Visits  12    Date for OT Re-Evaluation  05/01/18 mini reassess: 04/17/18    Authorization Type  [employer] NAP    Authorization Time Period  60 visit limit. 0 used.     Authorization - Visit Number  8    Authorization - Number of Visits  52    OT Start Time  0815    OT Stop Time  0900    OT Time Calculation (min)  45 min    Activity Tolerance  Patient tolerated treatment well    Behavior During Therapy  WFL for tasks assessed/performed       Past Medical History:  Diagnosis Date  . Arthritis   . GERD (gastroesophageal reflux disease)   . History of kidney stones   . Hypercholesteremia   . Scoliosis     Past Surgical History:  Procedure Laterality Date  . CARPAL TUNNEL RELEASE Right 04/02/2015   Procedure: RIGHT CARPAL TUNNEL RELEASE;  Surgeon: [name], MD;  Location: [facility];  Service: Orthopedics;  Laterality: Right;  . dental bone implant     bottom - right jaw.  Marland Kitchen HIP SURGERY    . left hip replacement    . SHOULDER ARTHROSCOPY WITH SUBACROMIAL DECOMPRESSION, ROTATOR CUFF REPAIR AND BICEP TENDON REPAIR Right 01/31/2018   Procedure: Right Shoulder Arthroscopy, Biceps Tenodesis, Mini Open Rotator Cuff Tear Repair, Possible Superior Capsular Reconstruction;  Surgeon: [name], MD;  Location: [facility];  Service: Orthopedics;  Laterality: Right;  . SHOULDER OPEN ROTATOR CUFF REPAIR Left 05/30/2017   Procedure: ROTATOR CUFF REPAIR SHOULDER OPEN;  Surgeon: [name], MD;  Location: [facility];  Service: Orthopedics;  Laterality: Left;  . TOOTH EXTRACTION  01/04/2018    1  tooth    There were no vitals filed for this visit.  Subjective Assessment - 04/12/18 0832    Subjective   S: I only have a little bit of soreness sometimes in the front.     Currently in Pain?  No/denies         [facility] OT Assessment - 04/12/18 6962      Assessment   Medical Diagnosis  right shoulder surgery      Precautions   Precautions  None    Precaution Comments  Progress as tolerated.               OT Treatments/Exercises (OP) - 04/12/18 9528      Exercises   Exercises  Shoulder      Shoulder Exercises: Supine   Protraction  PROM;5 reps;Strengthening;15 reps    Protraction Weight (lbs)  2    Horizontal ABduction  PROM;5 reps;Strengthening;15 reps    Horizontal ABduction Weight (lbs)  2    External Rotation  PROM;5 reps;Strengthening;15 reps    External Rotation Weight (lbs)  2    Internal Rotation  PROM;5 reps;Strengthening;15 reps    Internal Rotation Weight (lbs)  2    Flexion  PROM;5 reps;Strengthening;15 reps    Shoulder Flexion Weight (lbs)  2    ABduction  PROM;5 reps;Strengthening;15 reps  Shoulder ABduction Weight (lbs)  2      Shoulder Exercises: Prone   Retraction  Strengthening;15 reps    Retraction Weight (lbs)  2    Horizontal ABduction 1  Strengthening;15 reps    Horizontal ABduction 1 Weight (lbs)  2    Horizontal ABduction 2  Strengthening;15 reps    Horizontal ABduction 2 Weight (lbs)  2    Other Prone Exercises  Scaption 2#; 15X      Shoulder Exercises: Sidelying   External Rotation  Theraband;15 reps    Theraband Level (Shoulder External Rotation)  Level 2 (Red)    Flexion  Theraband;15 reps    Theraband Level (Shoulder Flexion)  Level 2 (Red)    Other Sidelying Exercises  Protraction; 15X red band    Other Sidelying Exercises  Horizontal abduction; 15X; 2#      Shoulder Exercises: ROM/Strengthening   Ball on Wall  1' flexion 1' abduction    Other ROM/Strengthening Exercises  Downward Diagonal and lateral wall walks with  red band; 10X      Manual Therapy   Manual Therapy  Myofascial release    Manual therapy comments  completed seperately from all other interventions    Myofascial Release  Myofascial release and manual stretching completed to right upper arm, trapezius, and scapularis region to decrease fascial restrictions and increase joint mobility in a pain free zone.              OT Education - 04/12/18 0855    Education provided  Yes    Education Details  red band loop for wall walks (diagonal and lateral)    Person(s) Educated  Patient    Methods  Explanation;Demonstration;Verbal cues;Handout    Comprehension  Returned demonstration;Verbalized understanding       OT Short Term Goals - 03/21/18 1006      OT SHORT TERM GOAL #1   Title  Patient will be educated on HEP in faciliated progress in therapy and increase shoulder mobility and strength in RUE needed for daily tasks.     Time  3    Period  Weeks    Status  On-going      OT SHORT TERM GOAL #2   Title  Patient will increase P/ROM to [facility] in order to increase ability to complete reaching tasks at shoulder level.     Time  3    Period  Weeks    Status  On-going      OT SHORT TERM GOAL #3   Title  Patient will increase Right shoulder strength to 3+/5 to increase ability to complete lightweight lifting tasks.    Time  3    Period  Weeks    Status  On-going      OT SHORT TERM GOAL #4   Title  Patient will decrease pain level to 3/10 when completing daily tasks using RUE.     Time  3    Period  Weeks    Status  On-going      OT SHORT TERM GOAL #5   Title  Patient will decrease fascial restrictions to trace amount in RUE to increase functional mobility needed for reaching tasks.     Time  3    Period  Weeks    Status  On-going        OT Long Term Goals - 03/21/18 1006      OT LONG TERM GOAL #1   Title  Patient will return to prior level  of function using right arm normally with all other ADLs, work, leisure activities.      Time  6    Period  Weeks    Status  On-going      OT LONG TERM GOAL #2   Title  Patient will increse RUE A/ROM to [facility] to increase ability to reach overhead and out to the side with less difficulty.     Time  6    Period  Weeks    Status  On-going      OT LONG TERM GOAL #3   Title  Patient will increase RUE shoulder strength to 4+/5 in order to complete overhead lifting tasks and be able to return to completing normal gym workout with less difficulty.     Time  6    Period  Weeks    Status  On-going      OT LONG TERM GOAL #4   Title  Patient will report a decrease pain in right shoulder of approximately 2/10 when complete tasks such as riding his motorcycle.    Time  6    Period  Weeks    Status  On-going            Plan - 04/12/18 1027    Clinical Impression Statement  A: Continued to focus on strengthening during session using red theraband and 2# hand weights. VC were needed for form and technique. With range remaining at shoulder level, patient was able to achieve better form. Muscle fatigue noted during exercises. Manual therapy was completed to address fascial restrictions and soreness in anterior shoulder region.     Plan  P: Complete FOTO and review therapy goals. Continue with band exercises sidelying to focus on scapular and shoulder strength.     Consulted and Agree with Plan of Care  Patient       Patient will benefit from skilled therapeutic intervention in order to improve the following deficits and impairments:  Decreased strength, Pain, Decreased range of motion, Increased fascial restrictions, Impaired UE functional use  Visit Diagnosis: Acute pain of right shoulder  Stiffness of right shoulder, not elsewhere classified  Other symptoms and signs involving the musculoskeletal system    Problem List Patient Active Problem List   Diagnosis Date Noted  . Complete tear of left rotator cuff   . Carpal tunnel syndrome of right wrist   . Pain in left  hip 03/23/2014  . Right Achilles tendinitis 12/27/2011  . History of total hip arthroplasty 09/05/2011  . Achilles bursitis or tendinitis 08/17/2011  . HIP, ARTHRITIS, DEGEN./OSTEO 11/30/2009  . BACK PAIN 11/30/2009   Ailene Ravel, OTR/L,CBIS  [phone]  04/12/2018, 10:29 AM  [facility] [address] [place], [place], [address] Phone: [phone]   Fax:  [phone]  Name: VERLAND SPRINKLE MRN: [id number] Date of Birth: Apr 22, 1961

## 2018-04-12 NOTE — Patient Instructions (Signed)
Wall walks - lateral   Complete once a day.   With hands against wall, walk your hands to the side against resistance of the band.   Complete 10 times out to the side.  Complete 10 times down diagonal to the side.

## 2018-04-13 IMAGING — CR DG ORBITS FOR FOREIGN BODY
2 series · 2 of 2 positions shown · non-contrast
Comparison: None.

CLINICAL DATA: Metal working/exposure; clearance prior to MRI

EXAM:
ORBITS FOR FOREIGN BODY - 2 VIEW

[w orbit pa (1 of 2)]
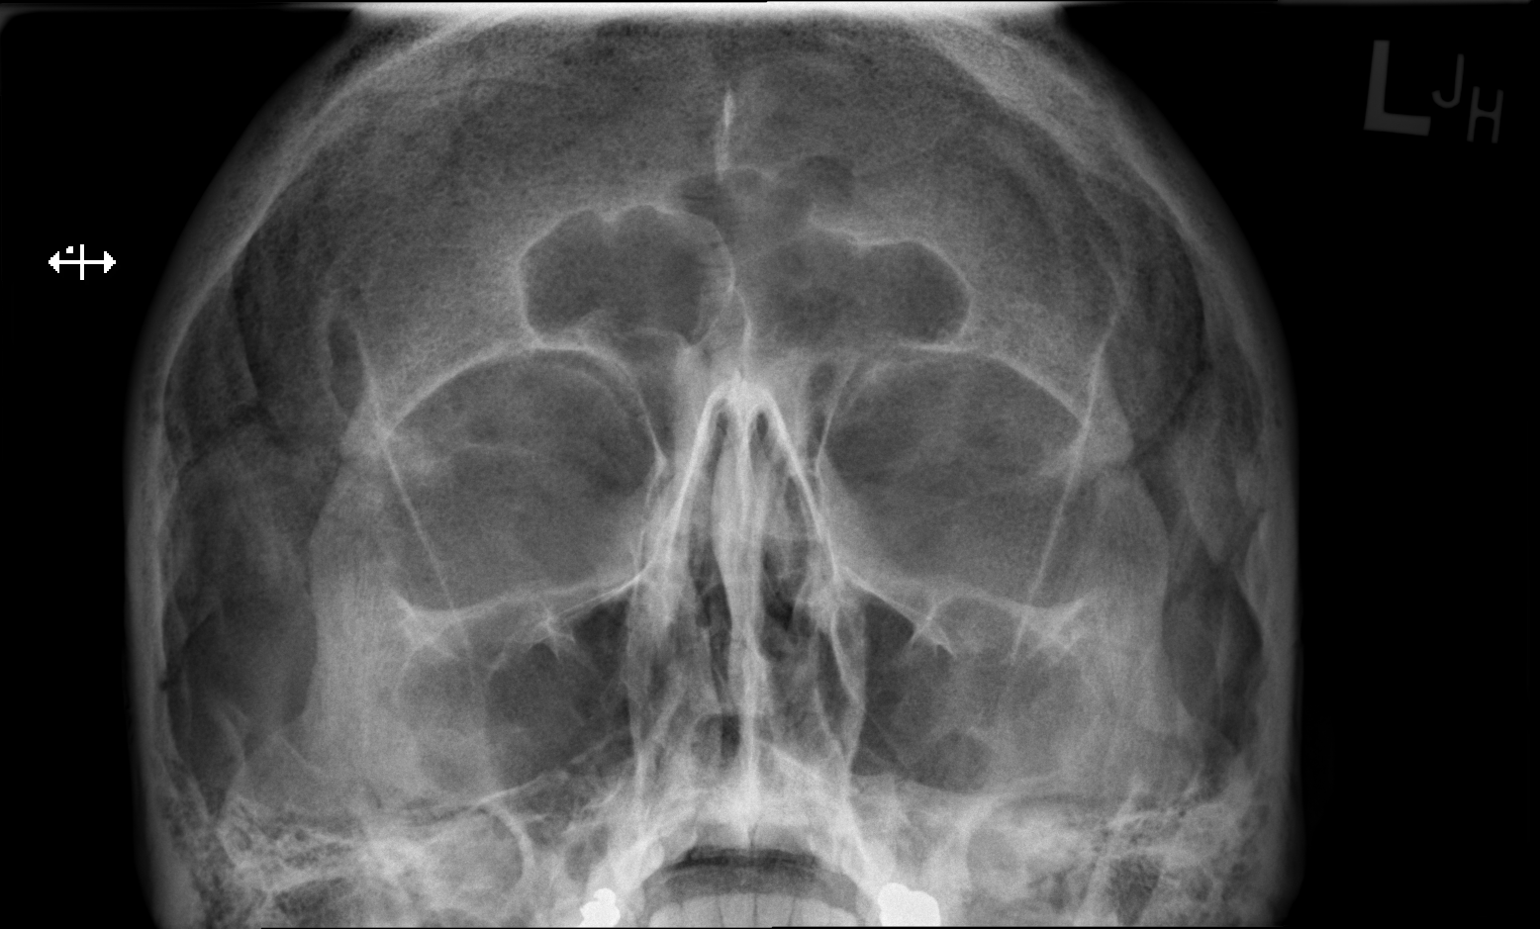

[w orbit pa (2 of 2)]
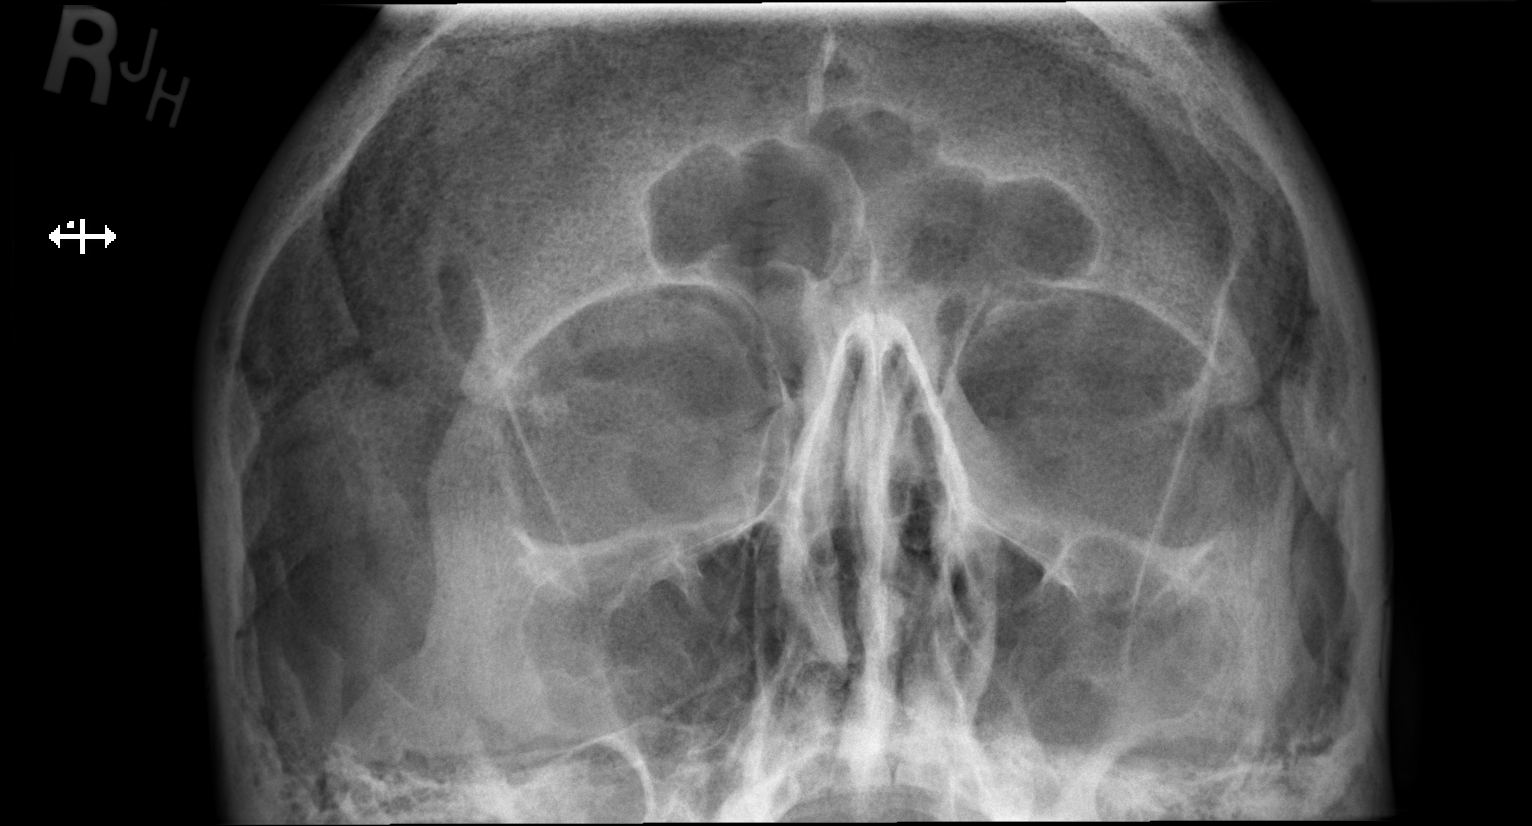

[2 of 2 positions shown; findings below may reference images not displayed]

FINDINGS: There is no evidence of metallic foreign body within the orbits. No
significant bone abnormality identified.
IMPRESSION: No evidence of metallic foreign body within the orbits.

## 2018-04-16 ENCOUNTER — Encounter (HOSPITAL_COMMUNITY): Payer: Self-pay

## 2018-04-16 ENCOUNTER — Other Ambulatory Visit: Payer: Self-pay

## 2018-04-16 ENCOUNTER — Ambulatory Visit (HOSPITAL_COMMUNITY): Payer: 59

## 2018-04-16 DIAGNOSIS — M25611 Stiffness of right shoulder, not elsewhere classified: Secondary | ICD-10-CM

## 2018-04-16 DIAGNOSIS — M25511 Pain in right shoulder: Secondary | ICD-10-CM

## 2018-04-16 DIAGNOSIS — R29898 Other symptoms and signs involving the musculoskeletal system: Secondary | ICD-10-CM

## 2018-04-16 NOTE — Therapy (Signed)
[place], [place], [address] Phone: [phone]   Fax:  [phone]  Occupational Therapy Treatment  Patient Details  Name: Arthur Aguilar MRN: [id number] Date of Birth: 01-Jun-1961 Referring Provider: [name] MD   Encounter Date: 04/16/2018  OT End of Session - 04/16/18 0953    Visit Number  9    Number of Visits  12    Date for OT Re-Evaluation  05/01/18    Authorization Type  [employer] NAP    Authorization Time Period  60 visit limit. 0 used.     Authorization - Visit Number  9    Authorization - Number of Visits  53    OT Start Time  [phone]    OT Stop Time  0945    OT Time Calculation (min)  40 min    Activity Tolerance  Patient tolerated treatment well    Behavior During Therapy  WFL for tasks assessed/performed       Past Medical History:  Diagnosis Date  . Arthritis   . GERD (gastroesophageal reflux disease)   . History of kidney stones   . Hypercholesteremia   . Scoliosis     Past Surgical History:  Procedure Laterality Date  . CARPAL TUNNEL RELEASE Right 04/02/2015   Procedure: RIGHT CARPAL TUNNEL RELEASE;  Surgeon: [name], MD;  Location: [facility];  Service: Orthopedics;  Laterality: Right;  . dental bone implant     bottom - right jaw.  Marland Kitchen HIP SURGERY    . left hip replacement    . SHOULDER ARTHROSCOPY WITH SUBACROMIAL DECOMPRESSION, ROTATOR CUFF REPAIR AND BICEP TENDON REPAIR Right 01/31/2018   Procedure: Right Shoulder Arthroscopy, Biceps Tenodesis, Mini Open Rotator Cuff Tear Repair, Possible Superior Capsular Reconstruction;  Surgeon: [name], MD;  Location: [facility];  Service: Orthopedics;  Laterality: Right;  . SHOULDER OPEN ROTATOR CUFF REPAIR Left 05/30/2017   Procedure: ROTATOR CUFF REPAIR SHOULDER OPEN;  Surgeon: [name], MD;  Location: [facility];  Service: Orthopedics;  Laterality: Left;  . TOOTH EXTRACTION  01/04/2018    1 tooth    There were no  vitals filed for this visit.  Subjective Assessment - 04/16/18 0947    Subjective   S: When I reach my shoulder tucks.     Special Tests  FOTO score: 63/100    Currently in Pain?  Yes    Pain Score  5     Pain Location  Shoulder    Pain Orientation  Right    Pain Descriptors / Indicators  Sore    Pain Type  Acute pain    Pain Radiating Towards  N/A    Pain Onset  Yesterday    Pain Frequency  Occasional    Aggravating Factors   With certain movements. Arm bike at [facility]    Pain Relieving Factors  unknown    Effect of Pain on Daily Activities  No effect         [facility] OT Assessment - 04/16/18 0950      Assessment   Medical Diagnosis  right shoulder surgery      Precautions   Precautions  None    Precaution Comments  Progress as tolerated.      Observation/Other Assessments   Focus on Therapeutic Outcomes (FOTO)   63/100               OT Treatments/Exercises (OP) - 04/16/18 8280  Exercises   Exercises  Shoulder      Shoulder Exercises: Supine   Protraction  PROM;5 reps    Horizontal ABduction  PROM;5 reps    External Rotation  PROM;5 reps    Internal Rotation  PROM;5 reps    Flexion  PROM;5 reps    ABduction  PROM;5 reps      Shoulder Exercises: Prone   Retraction  Strengthening;15 reps    Retraction Weight (lbs)  2    Horizontal ABduction 1  Strengthening;15 reps    Horizontal ABduction 1 Weight (lbs)  2    Horizontal ABduction 2  Strengthening;15 reps    Horizontal ABduction 2 Weight (lbs)  2    Other Prone Exercises  Scaption 2#; 15X      Shoulder Exercises: Sidelying   External Rotation  Theraband;15 reps    Theraband Level (Shoulder External Rotation)  Level 2 (Red)    Flexion  Theraband;15 reps    Theraband Level (Shoulder Flexion)  Level 2 (Red)    ABduction  Theraband;15 reps    Theraband Level (Shoulder ABduction)  Level 2 (Red)    Other Sidelying Exercises  Protraction; 15X red band    Other Sidelying Exercises  Horizontal abduction;  15X; 2#      Shoulder Exercises: ROM/Strengthening   Cybex Press  15 reps 4 plate    Cybex Row  15 reps 4 plate    Other ROM/Strengthening Exercises  Downward Diagonal and lateral wall walks with red band; 10X      Manual Therapy   Manual Therapy  Myofascial release    Manual therapy comments  completed seperately from all other interventions    Myofascial Release  Myofascial release and manual stretching completed to right upper arm, trapezius, and scapularis region to decrease fascial restrictions and increase joint mobility in a pain free zone.              OT Education - 04/16/18 [phone]    Education provided  Yes    Education Details  Reviwed therapy goals and progress in therapy.    Person(s) Educated  Patient    Methods  Explanation    Comprehension  Verbalized understanding       OT Short Term Goals - 04/16/18 0913      OT SHORT TERM GOAL #1   Title  Patient will be educated on HEP in faciliated progress in therapy and increase shoulder mobility and strength in RUE needed for daily tasks.     Time  3    Period  Weeks    Status  Achieved      OT SHORT TERM GOAL #2   Title  Patient will increase P/ROM to [facility] in order to increase ability to complete reaching tasks at shoulder level.     Time  3    Period  Weeks    Status  Achieved      OT SHORT TERM GOAL #3   Title  Patient will increase Right shoulder strength to 3+/5 to increase ability to complete lightweight lifting tasks.    Time  3    Period  Weeks    Status  Achieved      OT SHORT TERM GOAL #4   Title  Patient will decrease pain level to 3/10 when completing daily tasks using RUE.     Time  3    Period  Weeks    Status  Achieved      OT SHORT TERM GOAL #5  Title  Patient will decrease fascial restrictions to trace amount in RUE to increase functional mobility needed for reaching tasks.     Time  3    Period  Weeks    Status  Achieved        OT Long Term Goals - 04/16/18 0915      OT LONG  TERM GOAL #1   Title  Patient will return to prior level of function using right arm normally with all other ADLs, work, leisure activities.     Time  6    Period  Weeks    Status  On-going      OT LONG TERM GOAL #2   Title  Patient will increse RUE A/ROM to [facility] to increase ability to reach overhead and out to the side with less difficulty.     Time  6    Period  Weeks    Status  On-going      OT LONG TERM GOAL #3   Title  Patient will increase RUE shoulder strength to 4+/5 in order to complete overhead lifting tasks and be able to return to completing normal gym workout with less difficulty.     Time  6    Period  Weeks    Status  On-going      OT LONG TERM GOAL #4   Title  Patient will report a decrease pain in right shoulder of approximately 2/10 when complete tasks such as riding his motorcycle.    Time  6    Period  Weeks    Status  On-going            Plan - 04/16/18 4782    Clinical Impression Statement  A: Meaurements were completed 2 weeks ago for MD appointment and we not taken again this session. Reviewed goals and progress in therapy. Patient has met all short term goals at this point in treatment. He reports that he has difficulty with reaching his arm forward or out to the side in the sense that he tucks his arm due to decreased strength and mentally protecting shoulder. Patient continues to have decreased strength and shoulder stability and requires VC for form and technique. Per MD recommendation, patient remains at or below shoulder level for all exercises.     Plan  P: Continue focusing on scapular and shoulder stability. Shoulder strengthening with therapy ball (remain at or below shoulder level).    Consulted and Agree with Plan of Care  Patient       Patient will benefit from skilled therapeutic intervention in order to improve the following deficits and impairments:  Decreased strength, Pain, Decreased range of motion, Increased fascial restrictions,  Impaired UE functional use  Visit Diagnosis: Acute pain of right shoulder  Stiffness of right shoulder, not elsewhere classified  Other symptoms and signs involving the musculoskeletal system    Problem List Patient Active Problem List   Diagnosis Date Noted  . Complete tear of left rotator cuff   . Carpal tunnel syndrome of right wrist   . Pain in left hip 03/23/2014  . Right Achilles tendinitis 12/27/2011  . History of total hip arthroplasty 09/05/2011  . Achilles bursitis or tendinitis 08/17/2011  . HIP, ARTHRITIS, DEGEN./OSTEO 11/30/2009  . BACK PAIN 11/30/2009   Ailene Ravel, OTR/L,CBIS  [phone]  04/16/2018, 10:04 AM  [facility] [address] [place], [place], [address] Phone: [phone]   Fax:  [phone]  Name: Arthur Aguilar  MRN: [id number] Date of Birth: 12-26-1960

## 2018-04-18 ENCOUNTER — Other Ambulatory Visit: Payer: Self-pay

## 2018-04-18 ENCOUNTER — Ambulatory Visit (HOSPITAL_COMMUNITY): Payer: 59

## 2018-04-18 ENCOUNTER — Encounter (HOSPITAL_COMMUNITY): Payer: Self-pay

## 2018-04-18 DIAGNOSIS — M25511 Pain in right shoulder: Secondary | ICD-10-CM

## 2018-04-18 DIAGNOSIS — M25611 Stiffness of right shoulder, not elsewhere classified: Secondary | ICD-10-CM

## 2018-04-18 DIAGNOSIS — R29898 Other symptoms and signs involving the musculoskeletal system: Secondary | ICD-10-CM

## 2018-04-18 NOTE — Therapy (Signed)
[facility], [place], [address] Phone: [phone]   Fax:  [phone]  Occupational Therapy Treatment  Patient Details  Name: Arthur Aguilar MRN: [id number] Date of Birth: 01-12-1961 Referring Provider: [name] MD   Encounter Date: 04/18/2018  OT End of Session - 04/18/18 0929    Visit Number  10    Number of Visits  12    Date for OT Re-Evaluation  05/01/18    Authorization Type  [employer] NAP    Authorization Time Period  60 visit limit. 0 used.     Authorization - Visit Number  10    Authorization - Number of Visits  10    OT Start Time  [phone]    OT Stop Time  0945    OT Time Calculation (min)  40 min    Activity Tolerance  Patient tolerated treatment well    Behavior During Therapy  WFL for tasks assessed/performed       Past Medical History:  Diagnosis Date  . Arthritis   . GERD (gastroesophageal reflux disease)   . History of kidney stones   . Hypercholesteremia   . Scoliosis     Past Surgical History:  Procedure Laterality Date  . CARPAL TUNNEL RELEASE Right 04/02/2015   Procedure: RIGHT CARPAL TUNNEL RELEASE;  Surgeon: [name], MD;  Location: [facility];  Service: Orthopedics;  Laterality: Right;  . dental bone implant     bottom - right jaw.  Marland Kitchen HIP SURGERY    . left hip replacement    . SHOULDER ARTHROSCOPY WITH SUBACROMIAL DECOMPRESSION, ROTATOR CUFF REPAIR AND BICEP TENDON REPAIR Right 01/31/2018   Procedure: Right Shoulder Arthroscopy, Biceps Tenodesis, Mini Open Rotator Cuff Tear Repair, Possible Superior Capsular Reconstruction;  Surgeon: [name], MD;  Location: [facility];  Service: Orthopedics;  Laterality: Right;  . SHOULDER OPEN ROTATOR CUFF REPAIR Left 05/30/2017   Procedure: ROTATOR CUFF REPAIR SHOULDER OPEN;  Surgeon: [name], MD;  Location: [facility];  Service: Orthopedics;  Laterality: Left;  . TOOTH EXTRACTION  01/04/2018    1 tooth    There were no  vitals filed for this visit.  Subjective Assessment - 04/18/18 0924    Subjective   S: My arm was sore yesterday. I worked outside for 4 hours on [employer].     Currently in Pain?  No/denies         [facility] OT Assessment - 04/18/18 0925      Assessment   Medical Diagnosis  right shoulder surgery      Precautions   Precautions  None    Precaution Comments  Progress as tolerated.               OT Treatments/Exercises (OP) - 04/18/18 0925      Exercises   Exercises  Shoulder      Shoulder Exercises: Supine   Protraction  PROM;5 reps    Horizontal ABduction  PROM;5 reps    External Rotation  PROM;5 reps    Internal Rotation  PROM;5 reps    Flexion  PROM;5 reps    ABduction  PROM;5 reps      Shoulder Exercises: Prone   Retraction  Strengthening;15 reps    Retraction Weight (lbs)  2    Flexion  Strengthening;15 reps    Flexion Weight (lbs)  2    Horizontal ABduction 1  Strengthening;15 reps    Horizontal ABduction 1 Weight (lbs)  2    Horizontal ABduction 2  Strengthening;15 reps    Horizontal ABduction 2 Weight (lbs)  2    Other Prone Exercises  Scaption 2#; 15X      Shoulder Exercises: Sidelying   External Rotation  Theraband;15 reps    Theraband Level (Shoulder External Rotation)  Level 2 (Red)    Flexion  Theraband;15 reps    Theraband Level (Shoulder Flexion)  Level 2 (Red)    ABduction  Theraband;15 reps    Theraband Level (Shoulder ABduction)  Level 2 (Red)    Other Sidelying Exercises  Protraction; 15X red band    Other Sidelying Exercises  Horizontal abduction; 15X; red band      Shoulder Exercises: ROM/Strengthening   Over Head Lace  2'    Ball on Wall  1' flexion 1' abduction red ball    Other ROM/Strengthening Exercises  Green therapy ball: chest press, flexion (to 90), circles, horizontal side to side. 15X      Manual Therapy   Manual Therapy  Myofascial release    Manual therapy comments  completed seperately from all other interventions     Myofascial Release  Myofascial release and manual stretching completed to right upper arm, trapezius, and scapularis region to decrease fascial restrictions and increase joint mobility in a pain free zone.                OT Short Term Goals - 04/18/18 0956      OT SHORT TERM GOAL #1   Title  Patient will be educated on HEP in faciliated progress in therapy and increase shoulder mobility and strength in RUE needed for daily tasks.     Time  3    Period  Weeks      OT SHORT TERM GOAL #2   Title  Patient will increase P/ROM to [facility] in order to increase ability to complete reaching tasks at shoulder level.     Time  3    Period  Weeks      OT SHORT TERM GOAL #3   Title  Patient will increase Right shoulder strength to 3+/5 to increase ability to complete lightweight lifting tasks.    Time  3    Period  Weeks      OT SHORT TERM GOAL #4   Title  Patient will decrease pain level to 3/10 when completing daily tasks using RUE.     Time  3    Period  Weeks      OT SHORT TERM GOAL #5   Title  Patient will decrease fascial restrictions to trace amount in RUE to increase functional mobility needed for reaching tasks.     Time  3    Period  Weeks        OT Long Term Goals - 04/16/18 0915      OT LONG TERM GOAL #1   Title  Patient will return to prior level of function using right arm normally with all other ADLs, work, leisure activities.     Time  6    Period  Weeks    Status  On-going      OT LONG TERM GOAL #2   Title  Patient will increse RUE A/ROM to [facility] to increase ability to reach overhead and out to the side with less difficulty.     Time  6    Period  Weeks    Status  On-going      OT LONG TERM GOAL #3  Title  Patient will increase RUE shoulder strength to 4+/5 in order to complete overhead lifting tasks and be able to return to completing normal gym workout with less difficulty.     Time  6    Period  Weeks    Status  On-going      OT LONG TERM GOAL #4    Title  Patient will report a decrease pain in right shoulder of approximately 2/10 when complete tasks such as riding his motorcycle.    Time  6    Period  Weeks    Status  On-going            Plan - 04/18/18 7408    Clinical Impression Statement  A: Patient presents with min-mod amount of fascial restrictions in right anterior deltoid. Manual therapy completed to address prior to exercises. Continued to focus on scapular and shoulder strengthening using red band and hand weights. VC for form and technique.     Plan  P: Continue with scapular and shoulder stability as needed to complete functional tasks at home. Complete manual therapy to address fascial restrictions as needed.     Consulted and Agree with Plan of Care  Patient       Patient will benefit from skilled therapeutic intervention in order to improve the following deficits and impairments:  Decreased strength, Pain, Decreased range of motion, Increased fascial restrictions, Impaired UE functional use  Visit Diagnosis: Acute pain of right shoulder  Stiffness of right shoulder, not elsewhere classified  Other symptoms and signs involving the musculoskeletal system    Problem List Patient Active Problem List   Diagnosis Date Noted  . Complete tear of left rotator cuff   . Carpal tunnel syndrome of right wrist   . Pain in left hip 03/23/2014  . Right Achilles tendinitis 12/27/2011  . History of total hip arthroplasty 09/05/2011  . Achilles bursitis or tendinitis 08/17/2011  . HIP, ARTHRITIS, DEGEN./OSTEO 11/30/2009  . BACK PAIN 11/30/2009   Ailene Ravel, OTR/L,CBIS  [phone]  04/18/2018, 9:56 AM  [facility] [address] [place], [place], [address] Phone: [phone]   Fax:  [phone]  Name: Arthur Aguilar MRN: [id number] Date of Birth: 05/11/1961

## 2018-04-23 ENCOUNTER — Other Ambulatory Visit: Payer: Self-pay

## 2018-04-23 ENCOUNTER — Ambulatory Visit (HOSPITAL_COMMUNITY): Payer: 59

## 2018-04-23 ENCOUNTER — Encounter (HOSPITAL_COMMUNITY): Payer: Self-pay

## 2018-04-23 DIAGNOSIS — M25511 Pain in right shoulder: Secondary | ICD-10-CM | POA: Diagnosis not present

## 2018-04-23 DIAGNOSIS — M25611 Stiffness of right shoulder, not elsewhere classified: Secondary | ICD-10-CM

## 2018-04-23 DIAGNOSIS — R29898 Other symptoms and signs involving the musculoskeletal system: Secondary | ICD-10-CM

## 2018-04-23 NOTE — Therapy (Addendum)
[place], [place], [address] Phone: [phone]   Fax:  [phone]  Occupational Therapy Treatment  Patient Details  Name: Arthur Aguilar MRN: [id number] Date of Birth: 10-Apr-1961 Referring Provider: [name] MD   Encounter Date: 04/23/2018  OT End of Session - 04/23/18 0958    Visit Number  11    Number of Visits  12    Date for OT Re-Evaluation  05/01/18    Authorization Type  [employer] NAP    Authorization Time Period  60 visit limit. 0 used.     Authorization - Visit Number  11    Authorization - Number of Visits  6    OT Start Time  0902    OT Stop Time  0945    OT Time Calculation (min)  43 min    Activity Tolerance  Patient tolerated treatment well    Behavior During Therapy  WFL for tasks assessed/performed       Past Medical History:  Diagnosis Date  . Arthritis   . GERD (gastroesophageal reflux disease)   . History of kidney stones   . Hypercholesteremia   . Scoliosis     Past Surgical History:  Procedure Laterality Date  . CARPAL TUNNEL RELEASE Right 04/02/2015   Procedure: RIGHT CARPAL TUNNEL RELEASE;  Surgeon: [name], MD;  Location: [facility];  Service: Orthopedics;  Laterality: Right;  . dental bone implant     bottom - right jaw.  Marland Kitchen HIP SURGERY    . left hip replacement    . SHOULDER ARTHROSCOPY WITH SUBACROMIAL DECOMPRESSION, ROTATOR CUFF REPAIR AND BICEP TENDON REPAIR Right 01/31/2018   Procedure: Right Shoulder Arthroscopy, Biceps Tenodesis, Mini Open Rotator Cuff Tear Repair, Possible Superior Capsular Reconstruction;  Surgeon: [name], MD;  Location: [facility];  Service: Orthopedics;  Laterality: Right;  . SHOULDER OPEN ROTATOR CUFF REPAIR Left 05/30/2017   Procedure: ROTATOR CUFF REPAIR SHOULDER OPEN;  Surgeon: [name], MD;  Location: [facility];  Service: Orthopedics;  Laterality: Left;  . TOOTH EXTRACTION  01/04/2018    1 tooth    There were no  vitals filed for this visit.  Subjective Assessment - 04/23/18 0905    Subjective   S: [place] [employer] [place] and it really irritated it so I took a pain pill before coming in today.    Currently in Pain?  No/denies         [facility] OT Assessment - 04/23/18 3474      Assessment   Medical Diagnosis  right shoulder surgery      Precautions   Precautions  None    Precaution Comments  Progress as tolerated.               OT Treatments/Exercises (OP) - 04/23/18 0907      Exercises   Exercises  Shoulder      Shoulder Exercises: Supine   Protraction  PROM;5 reps    Horizontal ABduction  PROM;5 reps    External Rotation  PROM;5 reps    Internal Rotation  PROM;5 reps    Flexion  PROM;5 reps    ABduction  PROM;5 reps      Shoulder Exercises: Prone   Retraction  Strengthening;15 reps    Retraction Weight (lbs)  2    Flexion  Strengthening;15 reps    Flexion Weight (lbs)  2    Horizontal  ABduction 1  Strengthening;15 reps    Horizontal ABduction 1 Weight (lbs)  2    Horizontal ABduction 2  Strengthening;15 reps    Horizontal ABduction 2 Weight (lbs)  2    Other Prone Exercises  Scaption 2#; 15X      Shoulder Exercises: Sidelying   External Rotation  Theraband;15 reps    Theraband Level (Shoulder External Rotation)  Level 2 (Red)    Flexion  Theraband;15 reps    Theraband Level (Shoulder Flexion)  Level 2 (Red)    ABduction  Theraband;15 reps    Theraband Level (Shoulder ABduction)  Level 2 (Red)    Other Sidelying Exercises  Protraction; 15X red band    Other Sidelying Exercises  Horizontal abduction; 15X; red band      Shoulder Exercises: ROM/Strengthening   Ball on Wall  1' flexion 1' abduction red ball    Other ROM/Strengthening Exercises  Green therapy ball: chest press, flexion (to 90), circles, horizontal side to side. 15X      Manual Therapy   Manual Therapy  Myofascial release    Manual therapy comments  completed seperately  from all other interventions    Myofascial Release  Myofascial release and manual stretching completed to right upper arm, trapezius, and scapularis region to decrease fascial restrictions and increase joint mobility in a pain free zone.              OT Education - 04/23/18 0957    Education provided  No       OT Short Term Goals - 04/18/18 0956      OT SHORT TERM GOAL #1   Title  Patient will be educated on HEP in faciliated progress in therapy and increase shoulder mobility and strength in RUE needed for daily tasks.     Time  3    Period  Weeks      OT SHORT TERM GOAL #2   Title  Patient will increase P/ROM to [facility] in order to increase ability to complete reaching tasks at shoulder level.     Time  3    Period  Weeks      OT SHORT TERM GOAL #3   Title  Patient will increase Right shoulder strength to 3+/5 to increase ability to complete lightweight lifting tasks.    Time  3    Period  Weeks      OT SHORT TERM GOAL #4   Title  Patient will decrease pain level to 3/10 when completing daily tasks using RUE.     Time  3    Period  Weeks      OT SHORT TERM GOAL #5   Title  Patient will decrease fascial restrictions to trace amount in RUE to increase functional mobility needed for reaching tasks.     Time  3    Period  Weeks        OT Long Term Goals - 04/16/18 0915      OT LONG TERM GOAL #1   Title  Patient will return to prior level of function using right arm normally with all other ADLs, work, leisure activities.     Time  6    Period  Weeks    Status  On-going      OT LONG TERM GOAL #2   Title  Patient will increse RUE A/ROM to [facility] to increase ability to reach overhead and out to the side with less difficulty.     Time  6  Period  Weeks    Status  On-going      OT LONG TERM GOAL #3   Title  Patient will increase RUE shoulder strength to 4+/5 in order to complete overhead lifting tasks and be able to return to completing normal gym workout with less  difficulty.     Time  6    Period  Weeks    Status  On-going      OT LONG TERM GOAL #4   Title  Patient will report a decrease pain in right shoulder of approximately 2/10 when complete tasks such as riding his motorcycle.    Time  6    Period  Weeks    Status  On-going            Plan - 04/23/18 9211    Clinical Impression Statement  A: Patient presents with min-mod amount of fascial restrictions in right anterior deltoid. Manual therapy completed to address prior to exercises. Pt is motivated to increase shoulder function and reports high compliance with HEP as well as the completion of additional exercises. Continued to focus on scapular and shoulder strengthening using red band and hand weights. VC for form and technique.     Plan  P: Continue with scapular and shoulder stability as needed to complete functional tasks at home. Add ball drop shoulder stability exercise in flexion and abduction. Complete manual therapy to address fascial restrictions as needed.     Consulted and Agree with Plan of Care  Patient       Patient will benefit from skilled therapeutic intervention in order to improve the following deficits and impairments:  Decreased strength, Pain, Decreased range of motion, Increased fascial restrictions, Impaired UE functional use  Visit Diagnosis: Acute pain of right shoulder  Stiffness of right shoulder, not elsewhere classified  Other symptoms and signs involving the musculoskeletal system    Problem List Patient Active Problem List   Diagnosis Date Noted  . Complete tear of left rotator cuff   . Carpal tunnel syndrome of right wrist   . Pain in left hip 03/23/2014  . Right Achilles tendinitis 12/27/2011  . History of total hip arthroplasty 09/05/2011  . Achilles bursitis or tendinitis 08/17/2011  . HIP, ARTHRITIS, DEGEN./OSTEO 11/30/2009  . BACK PAIN 11/30/2009    Essenmacher, Clarene Duke OT student 04/23/2018, 12:06 PM  [facility] [address] [place], [place], [address] Phone: [phone]   Fax:  [phone]  Name: Arthur Aguilar MRN: [id number] Date of Birth: 20-Feb-1961

## 2018-04-25 ENCOUNTER — Ambulatory Visit (HOSPITAL_COMMUNITY): Payer: 59

## 2018-04-25 ENCOUNTER — Other Ambulatory Visit: Payer: Self-pay

## 2018-04-25 ENCOUNTER — Encounter (HOSPITAL_COMMUNITY): Payer: Self-pay

## 2018-04-25 DIAGNOSIS — M25611 Stiffness of right shoulder, not elsewhere classified: Secondary | ICD-10-CM

## 2018-04-25 DIAGNOSIS — M25511 Pain in right shoulder: Secondary | ICD-10-CM | POA: Diagnosis not present

## 2018-04-25 DIAGNOSIS — R29898 Other symptoms and signs involving the musculoskeletal system: Secondary | ICD-10-CM

## 2018-04-25 NOTE — Therapy (Addendum)
[place], [place], [address] Phone: [phone]   Fax:  [phone]  Occupational Therapy Treatment  Patient Details  Name: Arthur Aguilar MRN: [id number] Date of Birth: 05/18/1961 Referring Provider: [name] MD   Encounter Date: 04/25/2018  OT End of Session - 04/25/18 1131    Visit Number  12    Number of Visits  14    Date for OT Re-Evaluation  05/01/18    Authorization Type  [employer] NAP    Authorization Time Period  60 visit limit. 0 used.     Authorization - Visit Number  12    Authorization - Number of Visits  18    OT Start Time  [phone]    OT Stop Time  0945    OT Time Calculation (min)  42 min    Activity Tolerance  Patient tolerated treatment well    Behavior During Therapy  WFL for tasks assessed/performed       Past Medical History:  Diagnosis Date  . Arthritis   . GERD (gastroesophageal reflux disease)   . History of kidney stones   . Hypercholesteremia   . Scoliosis     Past Surgical History:  Procedure Laterality Date  . CARPAL TUNNEL RELEASE Right 04/02/2015   Procedure: RIGHT CARPAL TUNNEL RELEASE;  Surgeon: [name], MD;  Location: [facility];  Service: Orthopedics;  Laterality: Right;  . dental bone implant     bottom - right jaw.  Marland Kitchen HIP SURGERY    . left hip replacement    . SHOULDER ARTHROSCOPY WITH SUBACROMIAL DECOMPRESSION, ROTATOR CUFF REPAIR AND BICEP TENDON REPAIR Right 01/31/2018   Procedure: Right Shoulder Arthroscopy, Biceps Tenodesis, Mini Open Rotator Cuff Tear Repair, Possible Superior Capsular Reconstruction;  Surgeon: [name], MD;  Location: [facility];  Service: Orthopedics;  Laterality: Right;  . SHOULDER OPEN ROTATOR CUFF REPAIR Left 05/30/2017   Procedure: ROTATOR CUFF REPAIR SHOULDER OPEN;  Surgeon: [name], MD;  Location: [facility];  Service: Orthopedics;  Laterality: Left;  . TOOTH EXTRACTION  01/04/2018    1 tooth    There were no  vitals filed for this visit.  Subjective Assessment - 04/25/18 0906    Subjective   S: When I stick my arm straight out to grab something the shoulder still catches in the same place.    Currently in Pain?  No/denies         [facility] OT Assessment - 04/25/18 0908      Assessment   Medical Diagnosis  right shoulder surgery      Precautions   Precautions  None    Precaution Comments  MD requesting that strengthening remaining at or below shoulder level.                OT Treatments/Exercises (OP) - 04/25/18 0909      Exercises   Exercises  Shoulder      Shoulder Exercises: Supine   Protraction  PROM;5 reps    Horizontal ABduction  PROM;5 reps    External Rotation  PROM;5 reps    Internal Rotation  PROM;5 reps    Flexion  PROM;5 reps    ABduction  PROM;5 reps      Shoulder Exercises: Prone   Retraction  Strengthening;15 reps    Retraction Weight (lbs)  2    Flexion  Strengthening;15 reps    Flexion Weight (lbs)  2    Horizontal ABduction 1  Strengthening;15 reps    Horizontal ABduction 1 Weight (lbs)  2    Horizontal ABduction 2  Strengthening;15 reps Thumb pointed up towards the ceiling    Horizontal ABduction 2 Weight (lbs)  2    Other Prone Exercises  Scaption 2#; 15X      Shoulder Exercises: Sidelying   External Rotation  Theraband;15 reps    Theraband Level (Shoulder External Rotation)  Level 2 (Red)    Flexion  Theraband;15 reps    Theraband Level (Shoulder Flexion)  Level 2 (Red)    ABduction  Theraband;15 reps    Theraband Level (Shoulder ABduction)  Level 2 (Red)    Other Sidelying Exercises  Protraction; 15X red band    Other Sidelying Exercises  Horizontal abduction; 15X; red band      Shoulder Exercises: ROM/Strengthening   Ball on Wall  1' flexion 1' abduction red ball    Other ROM/Strengthening Exercises  Green therapy ball: chest press, flexion (to 90), circles, horizontal side to side. 15X    Other ROM/Strengthening Exercises  ball drop with  green weighted ball 2x30" for flexion and 1x30" for abduction       Manual Therapy   Manual Therapy  Myofascial release    Manual therapy comments  completed seperately from all other interventions    Myofascial Release  Myofascial release and manual stretching completed to right upper arm, trapezius, and scapularis region to decrease fascial restrictions and increase joint mobility in a pain free zone.              OT Education - 04/25/18 0910    Education provided  No       OT Short Term Goals - 04/18/18 0956      OT SHORT TERM GOAL #1   Title  Patient will be educated on HEP in faciliated progress in therapy and increase shoulder mobility and strength in RUE needed for daily tasks.     Time  3    Period  Weeks      OT SHORT TERM GOAL #2   Title  Patient will increase P/ROM to [facility] in order to increase ability to complete reaching tasks at shoulder level.     Time  3    Period  Weeks      OT SHORT TERM GOAL #3   Title  Patient will increase Right shoulder strength to 3+/5 to increase ability to complete lightweight lifting tasks.    Time  3    Period  Weeks      OT SHORT TERM GOAL #4   Title  Patient will decrease pain level to 3/10 when completing daily tasks using RUE.     Time  3    Period  Weeks      OT SHORT TERM GOAL #5   Title  Patient will decrease fascial restrictions to trace amount in RUE to increase functional mobility needed for reaching tasks.     Time  3    Period  Weeks        OT Long Term Goals - 04/16/18 0915      OT LONG TERM GOAL #1   Title  Patient will return to prior level of function using right arm normally with all other ADLs, work, leisure activities.     Time  6    Period  Weeks    Status  On-going      OT LONG TERM GOAL #2   Title  Patient will increse RUE  A/ROM to [facility] to increase ability to reach overhead and out to the side with less difficulty.     Time  6    Period  Weeks    Status  On-going      OT LONG TERM GOAL #3    Title  Patient will increase RUE shoulder strength to 4+/5 in order to complete overhead lifting tasks and be able to return to completing normal gym workout with less difficulty.     Time  6    Period  Weeks    Status  On-going      OT LONG TERM GOAL #4   Title  Patient will report a decrease pain in right shoulder of approximately 2/10 when complete tasks such as riding his motorcycle.    Time  6    Period  Weeks    Status  On-going            Plan - 04/25/18 0910    Clinical Impression Statement  A: Pt reports his shoulder still feels like it is catching when he lifts it up. He claims the plan is to eventually have a reverse total shoulder replacement on the left. Pt presents with min amount of fascial restrictions in right anterior deltoid which is a decrease from previous sessions. Continued to focus on scapular and shoulder strengthening using red band and hand weights. Added ball drop scapular stability exercise during which the pt required VC to maintain proper form due to fatigue.     Plan  P: Complete reassessment. Continue with scapular and shoulder stability. Complete arm bike for strengthening and add the arms on fire exercise where pt is to copy shoulder stability exercises done by therapist as time allows.    Consulted and Agree with Plan of Care  Patient       Patient will benefit from skilled therapeutic intervention in order to improve the following deficits and impairments:  Decreased strength, Pain, Decreased range of motion, Increased fascial restrictions, Impaired UE functional use  Visit Diagnosis: Acute pain of right shoulder  Stiffness of right shoulder, not elsewhere classified  Other symptoms and signs involving the musculoskeletal system    Problem List Patient Active Problem List   Diagnosis Date Noted  . Complete tear of left rotator cuff   . Carpal tunnel syndrome of right wrist   . Pain in left hip 03/23/2014  . Right Achilles tendinitis  12/27/2011  . History of total hip arthroplasty 09/05/2011  . Achilles bursitis or tendinitis 08/17/2011  . HIP, ARTHRITIS, DEGEN./OSTEO 11/30/2009  . BACK PAIN 11/30/2009    Essenmacher, Clarene Duke, OT student 04/25/2018, 12:29 PM  [facility] [address] [place], [place], [address] Phone: [phone]   Fax:  [phone]  Name: Arthur Aguilar MRN: [id number] Date of Birth: 1961/11/28

## 2018-04-30 ENCOUNTER — Ambulatory Visit (HOSPITAL_COMMUNITY): Payer: 59

## 2018-04-30 ENCOUNTER — Encounter (HOSPITAL_COMMUNITY): Payer: Self-pay

## 2018-04-30 ENCOUNTER — Other Ambulatory Visit: Payer: Self-pay

## 2018-04-30 DIAGNOSIS — M25611 Stiffness of right shoulder, not elsewhere classified: Secondary | ICD-10-CM

## 2018-04-30 DIAGNOSIS — M25511 Pain in right shoulder: Secondary | ICD-10-CM

## 2018-04-30 DIAGNOSIS — R29898 Other symptoms and signs involving the musculoskeletal system: Secondary | ICD-10-CM

## 2018-04-30 NOTE — Therapy (Addendum)
[place], [place], [address] Phone: [phone]   Fax:  [phone]  Occupational Therapy Treatment  Patient Details  Name: Arthur Aguilar MRN: [id number] Date of Birth: Jun 07, 1961 Referring Provider: [name] MD   Encounter Date: 04/30/2018  OT End of Session - 04/30/18 1000    Visit Number  13    Number of Visits  14    Authorization Type  [employer] NAP    Authorization Time Period  60 visit limit. 0 used.     Authorization - Visit Number  13    Authorization - Number of Visits  28    OT Start Time  0901 Reassessment and discharge    OT Stop Time  0944    OT Time Calculation (min)  43 min    Activity Tolerance  Patient tolerated treatment well    Behavior During Therapy  WFL for tasks assessed/performed       Past Medical History:  Diagnosis Date  . Arthritis   . GERD (gastroesophageal reflux disease)   . History of kidney stones   . Hypercholesteremia   . Scoliosis     Past Surgical History:  Procedure Laterality Date  . CARPAL TUNNEL RELEASE Right 04/02/2015   Procedure: RIGHT CARPAL TUNNEL RELEASE;  Surgeon: [name], MD;  Location: [facility];  Service: Orthopedics;  Laterality: Right;  . dental bone implant     bottom - right jaw.  Marland Kitchen HIP SURGERY    . left hip replacement    . SHOULDER ARTHROSCOPY WITH SUBACROMIAL DECOMPRESSION, ROTATOR CUFF REPAIR AND BICEP TENDON REPAIR Right 01/31/2018   Procedure: Right Shoulder Arthroscopy, Biceps Tenodesis, Mini Open Rotator Cuff Tear Repair, Possible Superior Capsular Reconstruction;  Surgeon: [name], MD;  Location: [facility];  Service: Orthopedics;  Laterality: Right;  . SHOULDER OPEN ROTATOR CUFF REPAIR Left 05/30/2017   Procedure: ROTATOR CUFF REPAIR SHOULDER OPEN;  Surgeon: [name], MD;  Location: [facility];  Service: Orthopedics;  Laterality: Left;  . TOOTH EXTRACTION  01/04/2018    1 tooth    There were no vitals filed  for this visit.  Subjective Assessment - 04/30/18 0951    Subjective   S: I've been trying to mow the lawn but it's always the same place right in the front that hurts.    Special Tests  64/100    Currently in Pain?  Yes    Pain Score  6     Pain Location  Shoulder    Pain Orientation  Right    Pain Descriptors / Indicators  Sore    Pain Type  Acute pain    Pain Radiating Towards  N/A    Pain Onset  More than a month ago    Pain Frequency  Occasional    Aggravating Factors   with movement and lifting. Arm bike at [facility]    Pain Relieving Factors  pain medication, rest    Effect of Pain on Daily Activities  minimal effect    Multiple Pain Sites  No         [facility] OT Assessment - 04/30/18 0913      Assessment   Medical Diagnosis  right shoulder surgery      Precautions   Precautions  None    Precaution Comments  MD requesting that strengthening remaining at or below shoulder level.       Observation/Other Assessments   Focus on Therapeutic Outcomes (FOTO)  64/100 previous: 63/100      AROM   AROM Assessment Site  Shoulder IR/er measured abducted Assessed seated   Right/Left Shoulder  Right    Right Shoulder Flexion  160 Degrees previous: 150    Right Shoulder ABduction  158 Degrees previous: 158    Right Shoulder Internal Rotation  90 Degrees previous: 80    Right Shoulder External Rotation  90 Degrees previous: 75      PROM   PROM Assessment Site  Shoulder IR/er measured abducted Assessed supine.   Right/Left Shoulder  Right    Right Shoulder Flexion  155 Degrees previous: 145    Right Shoulder ABduction  180 Degrees previous: Same    Right Shoulder Internal Rotation  90 Degrees previous: same    Right Shoulder External Rotation  65 Degrees previous: 65      Strength   Strength Assessment Site  Shoulder Assessed seated. IR/er adducted   Right/Left Shoulder  Right    Right Shoulder Flexion  4-/5 previous: 3/5    Right Shoulder ABduction  4-/5 previous: 3/5    Right  Shoulder Internal Rotation  5/5 previous: 5/5    Right Shoulder External Rotation  4-/5 previous: 3/5    Right Shoulder Horizontal ABduction  --    Right Shoulder Horizontal ADduction  --               OT Treatments/Exercises (OP) - 04/30/18 0912      Exercises   Exercises  Shoulder      Shoulder Exercises: Supine   Protraction  PROM;5 reps    Horizontal ABduction  PROM;5 reps    External Rotation  PROM;5 reps    Internal Rotation  PROM;5 reps    Flexion  PROM;5 reps    ABduction  PROM;5 reps             OT Education - 04/30/18 0958    Education provided  Yes    Education Details  Completed mini reassessment and reviewed progress/goals with patient. Pt HEP updated due to pt wishing to discharge from therapy and continue with HEP independently.     Person(s) Educated  Patient    Methods  Explanation;Demonstration;Verbal cues;Handout    Comprehension  Verbalized understanding       OT Short Term Goals - 04/30/18 0925      OT SHORT TERM GOAL #1   Title  Patient will be educated on HEP in faciliated progress in therapy and increase shoulder mobility and strength in RUE needed for daily tasks.     Time  3    Period  Weeks    Status  --      OT SHORT TERM GOAL #2   Title  Patient will increase P/ROM to [facility] in order to increase ability to complete reaching tasks at shoulder level.     Time  3    Period  Weeks    Status  --      OT SHORT TERM GOAL #3   Title  Patient will increase Right shoulder strength to 3+/5 to increase ability to complete lightweight lifting tasks.    Time  3    Period  Weeks      OT SHORT TERM GOAL #4   Title  Patient will decrease pain level to 3/10 when completing daily tasks using RUE.     Time  3    Period  Weeks      OT SHORT TERM GOAL #5  Title  Patient will decrease fascial restrictions to trace amount in RUE to increase functional mobility needed for reaching tasks.     Time  3    Period  Weeks        OT Long Term  Goals - 04/30/18 0930      OT LONG TERM GOAL #1   Title  Patient will return to prior level of function using right arm normally with all other ADLs, work, leisure activities.     Time  6    Period  Weeks    Status  Partially Met      OT LONG TERM GOAL #2   Title  Patient will increse RUE A/ROM to [facility] to increase ability to reach overhead and out to the side with less difficulty.     Time  6    Period  Weeks    Status  Achieved      OT LONG TERM GOAL #3   Title  Patient will increase RUE shoulder strength to 4+/5 in order to complete overhead lifting tasks and be able to return to completing normal gym workout with less difficulty.     Time  6    Period  Weeks    Status  Not Met      OT LONG TERM GOAL #4   Title  Patient will report a decrease pain in right shoulder of approximately 2/10 when complete tasks such as riding his motorcycle.    Time  6    Period  Weeks    Status  Not Met            Plan - 04/30/18 1002    Clinical Impression Statement  A: Mini reassessment completed. Pt reported higher pain level this session but demonstrated improvement in ROM and strength of right shoulder during assessment. Goals reviewed with patient. He has met his LTG for A/ROM and partially met his goal for being able to complete his ADLs, work, and leisure acticvities at his prior level of function. Although pt progress has slowed, OT recommended the patient continue with skilled OT services 2X a week for 4 more weeks in order to work on strengthening and addressing pain. Pt requested to try his HEP independently [facility]. Therapist updated HEP accordingly and instructed pt to continue with green theraband exercises in order to increase difficulty. Pt to discharge from skilled OT services at this time due to pt wishes and progress plateau.     Plan  P: Pt discharged from therapy with HEP.    Consulted and Agree with Plan of Care   Patient       Patient will benefit from skilled therapeutic intervention in order to improve the following deficits and impairments:  Decreased strength, Pain, Decreased range of motion, Increased fascial restrictions, Impaired UE functional use  Visit Diagnosis: Acute pain of right shoulder  Stiffness of right shoulder, not elsewhere classified  Other symptoms and signs involving the musculoskeletal system    Problem List Patient Active Problem List   Diagnosis Date Noted  . Complete tear of left rotator cuff   . Carpal tunnel syndrome of right wrist   . Pain in left hip 03/23/2014  . Right Achilles tendinitis 12/27/2011  . History of total hip arthroplasty 09/05/2011  . Achilles bursitis or tendinitis 08/17/2011  . HIP, ARTHRITIS, DEGEN./OSTEO 11/30/2009  . BACK PAIN 11/30/2009    Roderic Palau, OT student 04/30/2018, 10:30  [place], [place], [address] Phone: [phone]   Fax:  [phone]  Name: Arthur Aguilar MRN: [id number] Date of Birth: [birth year]   OCCUPATIONAL THERAPY DISCHARGE SUMMARY  Visits from Start of Care: 13  Current functional level related to goals / functional outcomes: See above   Remaining deficits: See above   Education / Equipment: See above Plan: Patient agrees to discharge.  Patient goals were partially met. Patient is being discharged due to the patient's request.  ?????        Ailene Ravel, OTR/L,CBIS  [phone]

## 2018-04-30 NOTE — Patient Instructions (Signed)
Horizontal Abduction. Shoulder  Hold resistive band keeping shoulders down. Slowly pull band laterally engaging scapular musculature until feeling the squeeze between shoulder blade.   Complete 10-15 reps. 1-2 times a day.     ELASTIC BAND SHOULDER EXTERNAL ROTATION - ER  While holding an elastic band at your side with your elbow bent, start with your hand near your stomach and then pull the band away. Keep your elbow at your side the entire time.  Complete 10-15 reps. 1-2 times day.

## 2018-05-03 ENCOUNTER — Encounter (HOSPITAL_COMMUNITY): Payer: Self-pay | Admitting: Occupational Therapy

## 2018-06-12 ENCOUNTER — Encounter (INDEPENDENT_AMBULATORY_CARE_PROVIDER_SITE_OTHER): Payer: Self-pay | Admitting: Orthopedic Surgery

## 2018-06-12 ENCOUNTER — Other Ambulatory Visit (INDEPENDENT_AMBULATORY_CARE_PROVIDER_SITE_OTHER): Payer: Self-pay

## 2018-06-12 ENCOUNTER — Ambulatory Visit (INDEPENDENT_AMBULATORY_CARE_PROVIDER_SITE_OTHER): Payer: 59 | Admitting: Orthopedic Surgery

## 2018-06-12 DIAGNOSIS — M75121 Complete rotator cuff tear or rupture of right shoulder, not specified as traumatic: Secondary | ICD-10-CM

## 2018-06-12 MED ORDER — HYDROCODONE-ACETAMINOPHEN 5-325 MG PO TABS: ORAL_TABLET | ORAL | 0 refills | Status: DC

## 2018-06-12 NOTE — Progress Notes (Signed)
Office Visit Note   Patient: Arthur Aguilar           Date of Birth: Apr 05, 1961           MRN: [id number] Visit Date: 06/12/2018 Requested by: [facility] [address] [place], [place] [address] PCP: [facility] [place] [facility]  Subjective: Chief Complaint  Patient presents with  . Right Shoulder - Follow-up    S/p right shoulder scope, BT, MORCTR 01/31/18    HPI: Arthur Aguilar is a patient is now about 4-1/2 months out superior capsular reconstruction for unrepairable rotator cuff tear.  He states that he has been doing stretching and that does not really have much effect on the very minimal amount of pain that he has remaining.  He states that his pain does depend on activity.  He has not been doing much lifting overhead.  He does ride his motorcycle.  Takes Norco occasionally and that is refilled today but to be taken on a very sparing basis.              ROS: All systems reviewed are negative as they relate to the chief complaint within the history of present illness.  Patient denies  fevers or chills.   Assessment & Plan: Visit Diagnoses:  1. Complete tear of right rotator cuff, unspecified whether traumatic     Plan: Impression is patient has good functional range of motion and reasonable strength following superior capsular reconstruction.  Currently he has forward flexion and abduction both above 90 degrees.  Strength is predictably diminished as I would expect.  At this time his pain relief is pretty good.  Biologically that reconstruction needs a couple more months to really become fully vascularized.  I will see him back in 6 months for final check.  Follow-Up Instructions: Return if symptoms worsen or fail to improve.   Orders:  No orders of the defined types were placed in this encounter.  No orders of the defined types were placed in this encounter.     Procedures: No procedures performed   Clinical Data: No additional  findings.  Objective: Vital Signs: There were no vitals taken for this visit.  Physical Exam:   Constitutional: Patient appears well-developed HEENT:  Head: Normocephalic Eyes:EOM are normal Neck: Normal range of motion Cardiovascular: Normal rate Pulmonary/chest: Effort normal Neurologic: Patient is alert Skin: Skin is warm Psychiatric: Patient has normal mood and affect    Ortho Exam: Orthopedic exam demonstrates well-healed surgical incision with no warmth or redness around the right shoulder.  Only minimal coarseness and grinding with internal and external rotation passively above 90 degrees of abduction.  He has forward flexion abduction about 140 and 100 respectively.  Motor or sensory function to the hand is intact.  Specialty Comments:  No specialty comments available.  Imaging: No results found.   PMFS History: Patient Active Problem List   Diagnosis Date Noted  . Complete tear of left rotator cuff   . Carpal tunnel syndrome of right wrist   . Pain in left hip 03/23/2014  . Right Achilles tendinitis 12/27/2011  . History of total hip arthroplasty 09/05/2011  . Achilles bursitis or tendinitis 08/17/2011  . HIP, ARTHRITIS, DEGEN./OSTEO 11/30/2009  . BACK PAIN 11/30/2009   Past Medical History:  Diagnosis Date  . Arthritis   . GERD (gastroesophageal reflux disease)   . History of kidney stones   . Hypercholesteremia   . Scoliosis     Family  History  Problem Relation Age of Onset  . Heart disease Unknown   . Arthritis Unknown   . Lung disease Unknown   . Diabetes Unknown     Past Surgical History:  Procedure Laterality Date  . CARPAL TUNNEL RELEASE Right 04/02/2015   Procedure: RIGHT CARPAL TUNNEL RELEASE;  Surgeon: [name], MD;  Location: [facility];  Service: Orthopedics;  Laterality: Right;  . dental bone implant     bottom - right jaw.  Marland Kitchen HIP SURGERY    . left hip replacement    . SHOULDER ARTHROSCOPY WITH SUBACROMIAL DECOMPRESSION,  ROTATOR CUFF REPAIR AND BICEP TENDON REPAIR Right 01/31/2018   Procedure: Right Shoulder Arthroscopy, Biceps Tenodesis, Mini Open Rotator Cuff Tear Repair, Possible Superior Capsular Reconstruction;  Surgeon: [name], MD;  Location: [facility];  Service: Orthopedics;  Laterality: Right;  . SHOULDER OPEN ROTATOR CUFF REPAIR Left 05/30/2017   Procedure: ROTATOR CUFF REPAIR SHOULDER OPEN;  Surgeon: [name], MD;  Location: [facility];  Service: Orthopedics;  Laterality: Left;  . TOOTH EXTRACTION  01/04/2018    1 tooth   Social History   Occupational History  . Occupation: Diplomatic Services operational officer: [employer]  Tobacco Use  . Smoking status: Former Smoker    Packs/day: 0.50    Years: 25.00    Pack years: 12.50    Types: Cigarettes    Last attempt to quit: 03/29/2009    Years since quitting: 9.2  . Smokeless tobacco: Never Used  Substance and Sexual Activity  . Alcohol use: Yes    Comment: 1 beer per night  . Drug use: No  . Sexual activity: Yes    Birth control/protection: None

## 2018-06-12 NOTE — Progress Notes (Signed)
nor

## 2018-06-17 ENCOUNTER — Ambulatory Visit (INDEPENDENT_AMBULATORY_CARE_PROVIDER_SITE_OTHER): Payer: 59

## 2018-06-17 ENCOUNTER — Ambulatory Visit: Payer: 59 | Admitting: Orthopedic Surgery

## 2018-06-17 VITALS — BP 100/52 | HR 84 | Ht 72.0 in | Wt 250.0 lb

## 2018-06-17 DIAGNOSIS — M25552 Pain in left hip: Secondary | ICD-10-CM

## 2018-06-17 NOTE — Progress Notes (Signed)
Patient ID: Arthur Aguilar, male   DOB: [birth year], 57 y.o.   MRN: [id number]  Chief Complaint  Patient presents with  . Follow-up    Recheck on left hip, DOS 12-14-09.    HPI Arthur Aguilar is a 57 y.o. male.  Presents for evaluation of intermittent left hip pain Status post  total hip arthroplasty. The patient is doing well the hip implant is functioning well.  Review of Systems Review of Systems  Constitutional symptoms no fever or chills Neurologic symptoms no numbness or tingling  Past Medical History:  Diagnosis Date  . Arthritis   . GERD (gastroesophageal reflux disease)   . History of kidney stones   . Hypercholesteremia   . Scoliosis     Past Surgical History:  Procedure Laterality Date  . CARPAL TUNNEL RELEASE Right 04/02/2015   Procedure: RIGHT CARPAL TUNNEL RELEASE;  Surgeon: [name], MD;  Location: [facility];  Service: Orthopedics;  Laterality: Right;  . dental bone implant     bottom - right jaw.  Marland Kitchen HIP SURGERY    . left hip replacement    . SHOULDER ARTHROSCOPY WITH SUBACROMIAL DECOMPRESSION, ROTATOR CUFF REPAIR AND BICEP TENDON REPAIR Right 01/31/2018   Procedure: Right Shoulder Arthroscopy, Biceps Tenodesis, Mini Open Rotator Cuff Tear Repair, Possible Superior Capsular Reconstruction;  Surgeon: [name], MD;  Location: [facility];  Service: Orthopedics;  Laterality: Right;  . SHOULDER OPEN ROTATOR CUFF REPAIR Left 05/30/2017   Procedure: ROTATOR CUFF REPAIR SHOULDER OPEN;  Surgeon: [name], MD;  Location: [facility];  Service: Orthopedics;  Laterality: Left;  . TOOTH EXTRACTION  01/04/2018    1 tooth     No Known Allergies  Current Outpatient Medications  Medication Sig Dispense Refill  . acetaminophen (TYLENOL) 500 MG tablet Take 1,000 mg by mouth every 6 (six) hours as needed (FOR PAIN.).    Marland Kitchen fenofibrate 160 MG tablet Take 160 mg by mouth daily.     . Flaxseed, Linseed, (FLAXSEED OIL) 1200 MG CAPS Take 1,200 mg by mouth 2 (two)  times daily.    . Glucosamine HCl (GLUCOSAMINE PO) Take 1,000 mg by mouth daily.     Marland Kitchen HYDROcodone-acetaminophen (NORCO) 5-325 MG tablet Take 1 tablet by mouth every 8 (eight) hours as needed for moderate pain. (Patient not taking: Reported on 03/01/2018) 30 tablet 0  . HYDROcodone-acetaminophen (NORCO/VICODIN) 5-325 MG tablet 1 po qd prn pain 30 tablet 0  . ibuprofen (ADVIL,MOTRIN) 800 MG tablet Take 1 tablet (800 mg total) by mouth every 8 (eight) hours as needed. (Patient taking differently: Take 800 mg by mouth every 8 (eight) hours as needed (FOR PAIN/SWELLING). ) 90 tablet 1  . loratadine (ALLERGY RELIEF) 10 MG tablet Take 10 mg by mouth daily as needed (FOR SEASONAL ALLERGIES--SPRING/SUMMER).     . Multiple Vitamin (MULTIVITAMIN WITH MINERALS) TABS tablet Take 1 tablet by mouth daily. (Men's 50+)    . NON FORMULARY Take 250 mg by mouth daily. HEMP EXTRACT    . Omega-3 Fatty Acids (FISH OIL) 1000 MG CAPS Take 2,000 mg by mouth 2 (two) times daily.    Marland Kitchen omeprazole (PRILOSEC OTC) 20 MG tablet Take 20 mg by mouth daily.    Marland Kitchen OVER THE COUNTER MEDICATION Take 500-1,000 mg by mouth at bedtime as needed (FOR SLEEP.). CBD GUMMIES     No current facility-administered medications for this visit.      Physical Exam Blood pressure (!) 100/52, pulse 84, height 6' (1.829 m),  weight 250 lb (113.4 kg).  Overall appearance normal grooming and hygiene normal. The patient is awake alert and oriented 3 with a pleasant mood and affect. The patient is ambulatory without assistive device and without limping.  Left HIP: The skin incision is healed without any erythema or tenderness or neuroma. Hip flexion strength grade 5. Hip is stable to post pull and abduction stress test. Hip flexion is 120. There is no tenderness or swelling around the hip. Distal pulses are intact sensation is normal and there is no lymphadenopathy in the groin patient walks with normal balance and coordination    Data  Reviewed X-rays were ordered today, separate report was dictated.  My interpretation of the x-ray today start: History today's imaging shows stable left total hip implant without loosening or complication  Assessment Status post left total hip, stable   Plan Routine repeat x-ray right hip in one year  10:14 AM [name], MD 06/17/2018

## 2018-08-14 ENCOUNTER — Ambulatory Visit (INDEPENDENT_AMBULATORY_CARE_PROVIDER_SITE_OTHER): Payer: 59 | Admitting: Orthopedic Surgery

## 2018-08-14 ENCOUNTER — Encounter: Payer: Self-pay | Admitting: Orthopedic Surgery

## 2018-08-14 ENCOUNTER — Ambulatory Visit (INDEPENDENT_AMBULATORY_CARE_PROVIDER_SITE_OTHER): Payer: 59

## 2018-08-14 VITALS — BP 140/85 | HR 79 | Ht 72.0 in | Wt 250.0 lb

## 2018-08-14 DIAGNOSIS — M79674 Pain in right toe(s): Secondary | ICD-10-CM

## 2018-08-14 DIAGNOSIS — M2041 Other hammer toe(s) (acquired), right foot: Secondary | ICD-10-CM | POA: Diagnosis not present

## 2018-08-14 NOTE — Progress Notes (Signed)
Progress Note   Patient ID: Arthur Aguilar, male   DOB: 03-13-1961, 57 y.o.   MRN: [id number]   Chief Complaint  Patient presents with  . Toe Pain    right 2nd toe     HPI The patient presents for evaluation of painful second toe  Location second digit right foot Duration 1 year worse last month Quality dull pain Severity mild but prevents comfortable shoewear Associated with redness over the PIP joint  Review of Systems  Constitutional: Negative for chills, fever and weight loss.  Respiratory: Negative for shortness of breath.   Cardiovascular: Negative for chest pain.  Neurological: Negative for tingling.   Current Meds  Medication Sig  . acetaminophen (TYLENOL) 500 MG tablet Take 1,000 mg by mouth every 6 (six) hours as needed (FOR PAIN.).  Marland Kitchen fenofibrate 160 MG tablet Take 160 mg by mouth daily.   . Flaxseed, Linseed, (FLAXSEED OIL) 1200 MG CAPS Take 1,200 mg by mouth 2 (two) times daily.  . Glucosamine HCl (GLUCOSAMINE PO) Take 1,000 mg by mouth daily.   Marland Kitchen HYDROcodone-acetaminophen (NORCO/VICODIN) 5-325 MG tablet 1 po qd prn pain  . ibuprofen (ADVIL,MOTRIN) 800 MG tablet Take 1 tablet (800 mg total) by mouth every 8 (eight) hours as needed. (Patient taking differently: Take 800 mg by mouth every 8 (eight) hours as needed (FOR PAIN/SWELLING). )  . loratadine (ALLERGY RELIEF) 10 MG tablet Take 10 mg by mouth daily as needed (FOR SEASONAL ALLERGIES--SPRING/SUMMER).   . Multiple Vitamin (MULTIVITAMIN WITH MINERALS) TABS tablet Take 1 tablet by mouth daily. (Men's 50+)  . NON FORMULARY Take 250 mg by mouth daily. HEMP EXTRACT  . Omega-3 Fatty Acids (FISH OIL) 1000 MG CAPS Take 2,000 mg by mouth 2 (two) times daily.  Marland Kitchen omeprazole (PRILOSEC OTC) 20 MG tablet Take 20 mg by mouth daily.  Marland Kitchen OVER THE COUNTER MEDICATION Take 500-1,000 mg by mouth at bedtime as needed (FOR SLEEP.). CBD GUMMIES    Past Medical History:  Diagnosis Date  . Arthritis   . GERD (gastroesophageal reflux  disease)   . History of kidney stones   . Hypercholesteremia   . Scoliosis      No Known Allergies   BP 140/85   Pulse 79   Ht 6' (1.829 m)   Wt 250 lb (113.4 kg)   BMI 33.91 kg/m    Physical Exam General appearance normal Oriented x3 normal Mood pleasant affect normal Gait normal   Ortho Exam Right lower Extremity Inspection reveals hammer if not claw toe deformity but it is flexible MP joint and PIP joint stable No atrophy is noted in the foot no plantar calluses noted Slight erythema over the dorsum of the second digit PIP joint Skin otherwise warm dry and intact pulse and perfusion color temperature normal Sensation normal   Left lower extremity left foot no atrophy skin is normal pulse and perfusion and sensation are normal   MEDICAL DECISION MAKING   Imaging:   orthopedic imaging cock-up deformity second digit PIP joint no arthritis   Encounter Diagnoses  Name Primary?  . Pain in right toe(s) Yes  . Hammer toe of second toe of right foot      PLAN: (RX., injection, surgery,frx,mri/ct, XR 2 body ares) I advised him that we can treat this 2 ways we can use some bracing to get the toe down for comfortable shoewear however the patient would like this corrected  Referred to podiatry for further treatment   No orders of the  defined types were placed in this encounter.  3:09 PM 08/14/2018

## 2018-09-06 ENCOUNTER — Other Ambulatory Visit: Payer: Self-pay | Admitting: Podiatry

## 2018-09-23 NOTE — Patient Instructions (Signed)
Arthur Aguilar  09/23/2018     @PREFPERIOPPHARMACY @   Your procedure is scheduled on  10/09/2018   Report to [name] at  615   A.M.  Call this number if you have problems the morning of surgery:  [phone]   Remember:  Do not eat or drink after midnight.                         Take these medicines the morning of surgery with A SIP OF WATER  Fenofibrate, hydrocodone ( if needed), prilosec.    Do not wear jewelry, make-up or nail polish.  Do not wear lotions, powders, or perfumes, or deodorant.  Do not shave 48 hours prior to surgery.  Men may shave face and neck.  Do not bring valuables to the hospital.  [facility] is not responsible for any belongings or valuables.  Contacts, dentures or bridgework may not be worn into surgery.  Leave your suitcase in the car.  After surgery it may be brought to your room.  For patients admitted to the hospital, discharge time will be determined by your treatment team.  Patients discharged the day of surgery will not be allowed to drive home.   Name and phone number of your driver:   family Special instructions:  None  Please read over the following fact sheets that you were given. Anesthesia Post-op Instructions and Care and Recovery After Surgery       Incision Care, Adult An incision is a cut that a doctor makes in your skin for surgery (for a procedure). Most times, these cuts are closed after surgery. Your cut from surgery may be closed with stitches (sutures), staples, skin glue, or skin tape (adhesive strips). You may need to return to your doctor to have stitches or staples taken out. This may happen many days or many weeks after your surgery. The cut needs to be well cared for so it does not get infected. How to care for your cut Cut care  Follow instructions from your doctor about how to take care of your cut. Make sure you: ? Wash your hands with soap and water before you change your bandage (dressing). If  you cannot use soap and water, use hand sanitizer. ? Change your bandage as told by your doctor. ? Leave stitches, skin glue, or skin tape in place. They may need to stay in place for 2 weeks or longer. If tape strips get loose and curl up, you may trim the loose edges. Do not remove tape strips completely unless your doctor says it is okay.  Check your cut area every day for signs of infection. Check for: ? More redness, swelling, or pain. ? More fluid or blood. ? Warmth. ? Pus or a bad smell.  Ask your doctor how to clean the cut. This may include: ? Using mild soap and water. ? Using a clean towel to pat the cut dry after you clean it. ? Putting a cream or ointment on the cut. Do this only as told by your doctor. ? Covering the cut with a clean bandage.  Ask your doctor when you can leave the cut uncovered.  Do not take baths, swim, or use a hot tub until your doctor says it is okay. Ask your doctor if you can take showers. You may only be allowed to take sponge baths for bathing. Medicines  If you were  prescribed an antibiotic medicine, cream, or ointment, take the antibiotic or put it on the cut as told by your doctor. Do not stop taking or putting on the antibiotic even if your condition gets better.  Take over-the-counter and prescription medicines only as told by your doctor. General instructions  Limit movement around your cut. This helps healing. ? Avoid straining, lifting, or exercise for the first month, or for as long as told by your doctor. ? Follow instructions from your doctor about going back to your normal activities. ? Ask your doctor what activities are safe.  Protect your cut from the sun when you are outside for the first 6 months, or for as long as told by your doctor. Put on sunscreen around the scar or cover up the scar.  Keep all follow-up visits as told by your doctor. This is important. Contact a doctor if:  Your have more redness, swelling, or pain  around the cut.  You have more fluid or blood coming from the cut.  Your cut feels warm to the touch.  You have pus or a bad smell coming from the cut.  You have a fever or shaking chills.  You feel sick to your stomach (nauseous) or you throw up (vomit).  You are dizzy.  Your stitches or staples come undone. Get help right away if:  You have a red streak coming from your cut.  Your cut bleeds through the bandage and the bleeding does not stop with gentle pressure.  The edges of your cut open up and separate.  You have very bad (severe) pain.  You have a rash.  You are confused.  You pass out (faint).  You have trouble breathing and you have a fast heartbeat. This information is not intended to replace advice given to you by your health care provider. Make sure you discuss any questions you have with your health care provider. Document Released: 02/12/2012 Document Revised: 07/28/2016 Document Reviewed: 07/28/2016 Elsevier Interactive Patient Education  2017 [place] Anesthesia is a term that refers to techniques, procedures, and medicines that help a person stay safe and comfortable during a medical procedure. Monitored anesthesia care, or sedation, is one type of anesthesia. Your anesthesia specialist may recommend sedation if you will be having a procedure that does not require you to be unconscious, such as:  Cataract surgery.  A dental procedure.  A biopsy.  A colonoscopy.  During the procedure, you may receive a medicine to help you relax (sedative). There are three levels of sedation:  Mild sedation. At this level, you may feel awake and relaxed. You will be able to follow directions.  Moderate sedation. At this level, you will be sleepy. You may not remember the procedure.  Deep sedation. At this level, you will be asleep. You will not remember the procedure.  The more medicine you are given, the deeper your level of  sedation will be. Depending on how you respond to the procedure, the anesthesia specialist may change your level of sedation or the type of anesthesia to fit your needs. An anesthesia specialist will monitor you closely during the procedure. Let your health care provider know about:  Any allergies you have.  All medicines you are taking, including vitamins, herbs, eye drops, creams, and over-the-counter medicines.  Any use of steroids (by mouth or as a cream).  Any problems you or family members have had with sedatives and anesthetic medicines.  Any blood disorders you have.  Any surgeries you have had.  Any medical conditions you have, such as sleep apnea.  Whether you are pregnant or may be pregnant.  Any use of cigarettes, alcohol, or street drugs. What are the risks? Generally, this is a safe procedure. However, problems may occur, including:  Getting too much medicine (oversedation).  Nausea.  Allergic reaction to medicines.  Trouble breathing. If this happens, a breathing tube may be used to help with breathing. It will be removed when you are awake and breathing on your own.  Heart trouble.  Lung trouble.  Before the procedure Staying hydrated Follow instructions from your health care provider about hydration, which may include:  Up to 2 hours before the procedure - you may continue to drink clear liquids, such as water, clear fruit juice, black coffee, and plain tea.  Eating and drinking restrictions Follow instructions from your health care provider about eating and drinking, which may include:  8 hours before the procedure - stop eating heavy meals or foods such as meat, fried foods, or fatty foods.  6 hours before the procedure - stop eating light meals or foods, such as toast or cereal.  6 hours before the procedure - stop drinking milk or drinks that contain milk.  2 hours before the procedure - stop drinking clear liquids.  Medicines Ask your health  care provider about:  Changing or stopping your regular medicines. This is especially important if you are taking diabetes medicines or blood thinners.  Taking medicines such as aspirin and ibuprofen. These medicines can thin your blood. Do not take these medicines before your procedure if your health care provider instructs you not to.  Tests and exams  You will have a physical exam.  You may have blood tests done to show: ? How well your kidneys and liver are working. ? How well your blood can clot.  General instructions  Plan to have someone take you home from the hospital or clinic.  If you will be going home right after the procedure, plan to have someone with you for 24 hours.  What happens during the procedure?  Your blood pressure, heart rate, breathing, level of pain and overall condition will be monitored.  An IV tube will be inserted into one of your veins.  Your anesthesia specialist will give you medicines as needed to keep you comfortable during the procedure. This may mean changing the level of sedation.  The procedure will be performed. After the procedure  Your blood pressure, heart rate, breathing rate, and blood oxygen level will be monitored until the medicines you were given have worn off.  Do not drive for 24 hours if you received a sedative.  You may: ? Feel sleepy, clumsy, or nauseous. ? Feel forgetful about what happened after the procedure. ? Have a sore throat if you had a breathing tube during the procedure. ? Vomit. This information is not intended to replace advice given to you by your health care provider. Make sure you discuss any questions you have with your health care provider. Document Released: 08/16/2005 Document Revised: 04/28/2016 Document Reviewed: 03/12/2016 Elsevier Interactive Patient Education  2018 [place], Care After These instructions provide you with information about caring for yourself  after your procedure. Your health care provider may also give you more specific instructions. Your treatment has been planned according to current medical practices, but problems sometimes occur. Call your health care provider if you have any problems or questions after your procedure.  What can I expect after the procedure? After your procedure, it is common to:  Feel sleepy for several hours.  Feel clumsy and have poor balance for several hours.  Feel forgetful about what happened after the procedure.  Have poor judgment for several hours.  Feel nauseous or vomit.  Have a sore throat if you had a breathing tube during the procedure.  Follow these instructions at home: For at least 24 hours after the procedure:   Do not: ? Participate in activities in which you could fall or become injured. ? Drive. ? Use heavy machinery. ? Drink alcohol. ? Take sleeping pills or medicines that cause drowsiness. ? Make important decisions or sign legal documents. ? Take care of children on your own.  Rest. Eating and drinking  Follow the diet that is recommended by your health care provider.  If you vomit, drink water, juice, or soup when you can drink without vomiting.  Make sure you have little or no nausea before eating solid foods. General instructions  Have a responsible adult stay with you until you are awake and alert.  Take over-the-counter and prescription medicines only as told by your health care provider.  If you smoke, do not smoke without supervision.  Keep all follow-up visits as told by your health care provider. This is important. Contact a health care provider if:  You keep feeling nauseous or you keep vomiting.  You feel light-headed.  You develop a rash.  You have a fever. Get help right away if:  You have trouble breathing. This information is not intended to replace advice given to you by your health care provider. Make sure you discuss any questions you  have with your health care provider. Document Released: 03/12/2016 Document Revised: 07/12/2016 Document Reviewed: 03/12/2016 Elsevier Interactive Patient Education  [employer].

## 2018-09-30 ENCOUNTER — Encounter (HOSPITAL_COMMUNITY)
Admission: RE | Admit: 2018-09-30 | Discharge: 2018-09-30 | Disposition: A | Discharge status: Home / Self Care | Payer: 59 | Source: Ambulatory Visit | Attending: Podiatry | Admitting: Podiatry

## 2018-09-30 ENCOUNTER — Other Ambulatory Visit (HOSPITAL_COMMUNITY): Payer: Self-pay | Admitting: Podiatry

## 2018-09-30 ENCOUNTER — Other Ambulatory Visit: Payer: Self-pay

## 2018-09-30 ENCOUNTER — Ambulatory Visit (HOSPITAL_COMMUNITY)
Admission: RE | Admit: 2018-09-30 | Discharge: 2018-09-30 | Disposition: A | Discharge status: Home / Self Care | Payer: 59 | Source: Ambulatory Visit | Attending: Podiatry | Admitting: Podiatry

## 2018-09-30 ENCOUNTER — Encounter (HOSPITAL_COMMUNITY): Payer: Self-pay

## 2018-09-30 DIAGNOSIS — M2041 Other hammer toe(s) (acquired), right foot: Secondary | ICD-10-CM | POA: Insufficient documentation

## 2018-09-30 DIAGNOSIS — Z01818 Encounter for other preprocedural examination: Secondary | ICD-10-CM | POA: Diagnosis not present

## 2018-09-30 DIAGNOSIS — R937 Abnormal findings on diagnostic imaging of other parts of musculoskeletal system: Secondary | ICD-10-CM | POA: Diagnosis not present

## 2018-09-30 LAB — CBC WITH DIFFERENTIAL/PLATELET
Abs Immature Granulocytes: 0.02 10*3/uL (ref 0.00–0.07)
BASOS ABS: 0.1 10*3/uL (ref 0.0–0.1)
Basophils Relative: 1 %
EOS ABS: 0.3 10*3/uL (ref 0.0–0.5)
EOS PCT: 4 %
HEMATOCRIT: 46.7 % (ref 39.0–52.0)
HEMOGLOBIN: 15.7 g/dL (ref 13.0–17.0)
Immature Granulocytes: 0 %
LYMPHS ABS: 2.4 10*3/uL (ref 0.7–4.0)
LYMPHS PCT: 31 %
MCH: 28.9 pg (ref 26.0–34.0)
MCHC: 33.6 g/dL (ref 30.0–36.0)
MCV: 86 fL (ref 80.0–100.0)
MONO ABS: 0.7 10*3/uL (ref 0.1–1.0)
MONOS PCT: 9 %
Neutro Abs: 4.2 10*3/uL (ref 1.7–7.7)
Neutrophils Relative %: 55 %
Platelets: 252 10*3/uL (ref 150–400)
RBC: 5.43 MIL/uL (ref 4.22–5.81)
RDW: 13.1 % (ref 11.5–15.5)
WBC: 7.7 10*3/uL (ref 4.0–10.5)
nRBC: 0 % (ref 0.0–0.2)

## 2018-09-30 LAB — BASIC METABOLIC PANEL
Anion gap: 14 (ref 5–15)
BUN: 13 mg/dL (ref 6–20)
CALCIUM: 9.5 mg/dL (ref 8.9–10.3)
CHLORIDE: 106 mmol/L (ref 98–111)
CO2: 21 mmol/L — ABNORMAL LOW (ref 22–32)
CREATININE: 0.99 mg/dL (ref 0.61–1.24)
GFR calc non Af Amer: >60 mL/min (ref >60)
GLUCOSE: 194 mg/dL — AB (ref 70–99)
Potassium: 3.9 mmol/L (ref 3.5–5.1)
Sodium: 141 mmol/L (ref 135–145)

## 2018-10-09 ENCOUNTER — Encounter (HOSPITAL_COMMUNITY): Payer: Self-pay | Admitting: *Deleted

## 2018-10-09 ENCOUNTER — Encounter (HOSPITAL_COMMUNITY)
Admission: RE | Disposition: A | Discharge status: Home / Self Care | Payer: Self-pay | Source: Ambulatory Visit | Attending: Podiatry

## 2018-10-09 ENCOUNTER — Ambulatory Visit (HOSPITAL_COMMUNITY): Payer: 59

## 2018-10-09 ENCOUNTER — Ambulatory Visit (HOSPITAL_COMMUNITY): Payer: 59 | Admitting: Anesthesiology

## 2018-10-09 ENCOUNTER — Ambulatory Visit (HOSPITAL_COMMUNITY)
Admission: RE | Admit: 2018-10-09 | Discharge: 2018-10-09 | Disposition: A | Discharge status: Home / Self Care | Payer: 59 | Source: Ambulatory Visit | Attending: Podiatry | Admitting: Podiatry

## 2018-10-09 DIAGNOSIS — Z79899 Other long term (current) drug therapy: Secondary | ICD-10-CM | POA: Diagnosis not present

## 2018-10-09 DIAGNOSIS — Z9889 Other specified postprocedural states: Secondary | ICD-10-CM

## 2018-10-09 DIAGNOSIS — Z87891 Personal history of nicotine dependence: Secondary | ICD-10-CM | POA: Insufficient documentation

## 2018-10-09 DIAGNOSIS — M2041 Other hammer toe(s) (acquired), right foot: Secondary | ICD-10-CM | POA: Insufficient documentation

## 2018-10-09 HISTORY — PX: FLEXOR TENOTOMY: SHX6342

## 2018-10-09 HISTORY — PX: TENDON LENGTHENING: SHX6786

## 2018-10-09 HISTORY — PX: FOOT ARTHRODESIS: SHX1655

## 2018-10-09 HISTORY — PX: CAPSULOTOMY: SHX379

## 2018-10-09 SURGERY — FUSION, JOINT, FOOT
Anesthesia: Monitor Anesthesia Care | Site: Foot | Laterality: Right

## 2018-10-09 MED ORDER — MIDAZOLAM HCL 2 MG/2ML IJ SOLN
INTRAMUSCULAR | Status: AC
  Filled 2018-10-09: qty 2

## 2018-10-09 MED ORDER — CEFAZOLIN SODIUM-DEXTROSE 2-4 GM/100ML-% IV SOLN
2.0000 g | Freq: Once | INTRAVENOUS | Status: AC
  Administered 2018-10-09: 2 g via INTRAVENOUS
  Filled 2018-10-09: qty 100

## 2018-10-09 MED ORDER — PROPOFOL 10 MG/ML IV BOLUS
INTRAVENOUS | Status: AC
  Filled 2018-10-09: qty 40

## 2018-10-09 MED ORDER — FENTANYL CITRATE (PF) 100 MCG/2ML IJ SOLN
INTRAMUSCULAR | Status: AC
  Filled 2018-10-09: qty 2

## 2018-10-09 MED ORDER — FENTANYL CITRATE (PF) 100 MCG/2ML IJ SOLN
INTRAMUSCULAR | Status: DC | PRN
  Administered 2018-10-09 (×2): 25 ug via INTRAVENOUS

## 2018-10-09 MED ORDER — KETOROLAC TROMETHAMINE 30 MG/ML IJ SOLN: 30.0000 mg | Freq: Once | INTRAMUSCULAR | Status: DC | PRN

## 2018-10-09 MED ORDER — 0.9 % SODIUM CHLORIDE (POUR BTL) OPTIME
TOPICAL | Status: DC | PRN
  Administered 2018-10-09: 1000 mL

## 2018-10-09 MED ORDER — LIDOCAINE HCL 1 % IJ SOLN
INTRAMUSCULAR | Status: DC | PRN
  Administered 2018-10-09 (×2): 10 mL via INTRAMUSCULAR

## 2018-10-09 MED ORDER — HYDROCODONE-ACETAMINOPHEN 7.5-325 MG PO TABS: 1.0000 | ORAL_TABLET | Freq: Once | ORAL | Status: DC | PRN

## 2018-10-09 MED ORDER — LIDOCAINE HCL (PF) 1 % IJ SOLN
INTRAMUSCULAR | Status: AC
  Filled 2018-10-09: qty 30

## 2018-10-09 MED ORDER — BUPIVACAINE HCL (PF) 0.5 % IJ SOLN
INTRAMUSCULAR | Status: AC
  Filled 2018-10-09: qty 30

## 2018-10-09 MED ORDER — PROPOFOL 10 MG/ML IV BOLUS
INTRAVENOUS | Status: DC | PRN
  Administered 2018-10-09 (×2): 20 mg via INTRAVENOUS
  Administered 2018-10-09: 10 mg via INTRAVENOUS
  Administered 2018-10-09: 20 mg via INTRAVENOUS

## 2018-10-09 MED ORDER — LACTATED RINGERS IV SOLN
INTRAVENOUS | Status: DC
  Administered 2018-10-09: 07:00:00 via INTRAVENOUS

## 2018-10-09 MED ORDER — MEPERIDINE HCL 50 MG/ML IJ SOLN: 6.2500 mg | INTRAMUSCULAR | Status: DC | PRN

## 2018-10-09 MED ORDER — PROPOFOL 500 MG/50ML IV EMUL
INTRAVENOUS | Status: DC | PRN
  Administered 2018-10-09: 100 ug/kg/min via INTRAVENOUS
  Administered 2018-10-09 (×2): via INTRAVENOUS

## 2018-10-09 MED ORDER — HYDROMORPHONE HCL 1 MG/ML IJ SOLN: 0.2500 mg | INTRAMUSCULAR | Status: DC | PRN

## 2018-10-09 MED ORDER — MIDAZOLAM HCL 5 MG/5ML IJ SOLN
INTRAMUSCULAR | Status: DC | PRN
  Administered 2018-10-09: 2 mg via INTRAVENOUS

## 2018-10-09 MED ORDER — ONDANSETRON HCL 4 MG/2ML IJ SOLN: 4.0000 mg | Freq: Once | INTRAMUSCULAR | Status: DC | PRN

## 2018-10-09 SURGICAL SUPPLY — 51 items
BANDAGE ELASTIC 4 LF NS (GAUZE/BANDAGES/DRESSINGS) ×3 IMPLANT
BANDAGE ESMARK 4X12 BL STRL LF (DISPOSABLE) ×1 IMPLANT
BENZOIN TINCTURE PRP APPL 2/3 (GAUZE/BANDAGES/DRESSINGS) ×3 IMPLANT
BLADE AVERAGE 25MMX9MM (BLADE) ×1
BLADE AVERAGE 25X9 (BLADE) ×2 IMPLANT
BLADE OSC/SAG 11.5X5.5X.38 (BLADE) ×3 IMPLANT
BLADE SURG 15 STRL LF DISP TIS (BLADE) ×3 IMPLANT
BLADE SURG 15 STRL SS (BLADE) ×6
BNDG CONFORM 2 STRL LF (GAUZE/BANDAGES/DRESSINGS) ×3 IMPLANT
BNDG ESMARK 4X12 BLUE STRL LF (DISPOSABLE) ×3
BNDG GAUZE ELAST 4 BULKY (GAUZE/BANDAGES/DRESSINGS) ×3 IMPLANT
BOOT STEPPER DURA LG (SOFTGOODS) ×3 IMPLANT
CAP PIN PROTECTOR ORTHO WHT (CAP) ×3 IMPLANT
CHLORAPREP W/TINT 26ML (MISCELLANEOUS) ×3 IMPLANT
CLOSURE WOUND 1/2 X4 (GAUZE/BANDAGES/DRESSINGS) ×1
CLOTH BEACON ORANGE TIMEOUT ST (SAFETY) ×3 IMPLANT
COVER LIGHT HANDLE STERIS (MISCELLANEOUS) ×6 IMPLANT
CUFF TOURNIQUET SINGLE 18IN (TOURNIQUET CUFF) ×3 IMPLANT
DECANTER SPIKE VIAL GLASS SM (MISCELLANEOUS) ×6 IMPLANT
DRAPE OEC MINIVIEW 54X84 (DRAPES) ×3 IMPLANT
DRSG ADAPTIC 3X8 NADH LF (GAUZE/BANDAGES/DRESSINGS) ×3 IMPLANT
ELECT REM PT RETURN 9FT ADLT (ELECTROSURGICAL) ×3
ELECTRODE REM PT RTRN 9FT ADLT (ELECTROSURGICAL) ×1 IMPLANT
GAUZE SPONGE 4X4 12PLY STRL (GAUZE/BANDAGES/DRESSINGS) ×3 IMPLANT
GLOVE BIO SURGEON STRL SZ7.5 (GLOVE) ×3 IMPLANT
GLOVE BIOGEL PI IND STRL 7.0 (GLOVE) ×2 IMPLANT
GLOVE BIOGEL PI IND STRL 7.5 (GLOVE) ×1 IMPLANT
GLOVE BIOGEL PI INDICATOR 7.0 (GLOVE) ×4
GLOVE BIOGEL PI INDICATOR 7.5 (GLOVE) ×2
GLOVE ECLIPSE 6.5 STRL STRAW (GLOVE) ×3 IMPLANT
GLOVE ECLIPSE 7.0 STRL STRAW (GLOVE) ×6 IMPLANT
GOWN STRL REUS W/ TWL LRG LVL3 (GOWN DISPOSABLE) ×1 IMPLANT
GOWN STRL REUS W/TWL LRG LVL3 (GOWN DISPOSABLE) ×8 IMPLANT
K-WIRE 6 (WIRE) ×3
K-WIRE SGL PT/SMTH 9X35 (WIRE) ×3
KIT TURNOVER CYSTO (KITS) ×3 IMPLANT
KWIRE 6 (WIRE) ×1 IMPLANT
KWIRE SGL PT/SMTH 9X35 (WIRE) ×1 IMPLANT
MANIFOLD NEPTUNE II (INSTRUMENTS) ×3 IMPLANT
NEEDLE HYPO 25X1 1.5 SAFETY (NEEDLE) ×15 IMPLANT
NS IRRIG 1000ML POUR BTL (IV SOLUTION) ×3 IMPLANT
PACK BASIC LIMB (CUSTOM PROCEDURE TRAY) ×3 IMPLANT
PAD ARMBOARD 7.5X6 YLW CONV (MISCELLANEOUS) ×3 IMPLANT
SET BASIN LINEN APH (SET/KITS/TRAYS/PACK) ×3 IMPLANT
STRIP CLOSURE SKIN 1/2X4 (GAUZE/BANDAGES/DRESSINGS) ×2 IMPLANT
SUT PROLENE 4 0 PS 2 18 (SUTURE) ×6 IMPLANT
SUT VIC AB 2-0 CT2 27 (SUTURE) ×3 IMPLANT
SUT VIC AB 4-0 PS2 27 (SUTURE) ×3 IMPLANT
SUT VICRYL AB 3-0 FS1 BRD 27IN (SUTURE) ×3 IMPLANT
SYR 10ML LL (SYRINGE) ×6 IMPLANT
k wire 0.045 ×3 IMPLANT

## 2018-10-09 NOTE — Op Note (Signed)
9:16 AM  PATIENT:  Arthur Aguilar  57 y.o. male  PRE-OPERATIVE DIAGNOSIS:  Right painful 2nd hammer toe  POST-OPERATIVE DIAGNOSIS:  Right painful 2nd hammer toe  PROCEDURE:  Procedure(s): ARTHRODESIS 2ND DIGIT RIGHT FOOT (Right) EXTENSOR TENDON LENGTHENING (Right) FLEXOR TENOTOMY (Right) right second  MPJ CAPSULOTOMY (Right)  SURGEON:  Surgeon(s) and Role:    * [name], DPM - Primary  PHYSICIAN ASSISTANT: none.   ANESTHESIA:   local and IV sedation  EBL:  10 mL   Materials used 4'5 inch K wire.   LOCAL MEDICATIONS USED:  MARCAINE   , LIDOCAINE  and Amount: 10 ml pre op and 67ml Post op.   SPECIMEN:  No Specimen  TOURNIQUET:   Total Tourniquet Time Documented: Calf (Right) - 62 minutes Total: Calf (Right) - 62 minutes  Patient was brought into the operating room laid supine on the operating table. Ankle tourniquet was applied to the surgical extremity. Following IV sedation, a local block was achieved using 10 cc of mixture of 1% plain lidocaine with 0.5% marcaine. The foot was the prepped, scrubbed and draped in aseptic manner. Using an esmarch band the tourniquet on the surgical site was inflatted at 257mmHG.   Attention was directed towards the right 2nd toe. A 6 cm dorsal linear incision was planned from just proximal to the toe nail to the MPJ level.  Using skin blade incision was made down to subcutaneous tissue. Care was made to retract all neurovascular bundle. At this time using deep blade, transverse tenotomy of the extenson tendon was performed at the PIPJ and DIPJ. The head of the proximal phalanx was freed and using a saw, the head of proximal phalanx was removed. The head and base of the middle phalanx was resected next. All sharp edges were rasped down. At this the extensor tendon was released from the extensor hood and extensor hood apparatus. The toe was still contracted at the MPJ. At this time transverse incision was made at the 2nd MPJ capusule to  perform capsulotomy. Open flexor tenotomy was performed at the DIPJ. At this time 4'5 Inch K wire was inserted from the base of the middle phalanx and out to the tip of the toe. The K wire was then retrograded back into the proximal phalanx. Fluroscopy was used to visualized the K wire. It was in adequate position. The wound was flushed. The K Wire was bent and cut at the tip using wire cutter. A cap was placed over the K wire. A Z lengthening of the extensor tendon was performed. The tendon was repaired using 2-0 Vicryl. Sucuatenous tissue was closed using 4-0 Vicryl. The Skin was closed usin 3-0 Prolene. There was residual skin left that was excised.   Betadine splint dressing was applied. Dry sterile dressing was applied. Tourniquet was deflated and the capillary refill time was brisk to the toe.   Patient will be in cam walker with limited weightbearing.

## 2018-10-09 NOTE — Anesthesia Postprocedure Evaluation (Signed)
Anesthesia Post Note  Patient: Arthur Aguilar  Procedure(s) Performed: ARTHRODESIS 2ND DIGIT RIGHT FOOT (Right Foot) EXTENSOR TENDON LENGTHENING (Right Foot) FLEXOR TENOTOMY (Right Foot) right second  MPJ CAPSULOTOMY (Right Foot)  Patient location during evaluation: PACU Anesthesia Type: MAC Level of consciousness: awake and alert and oriented Pain management: pain level controlled Vital Signs Assessment: post-procedure vital signs reviewed and stable Respiratory status: spontaneous breathing Cardiovascular status: blood pressure returned to baseline and stable Postop Assessment: no apparent nausea or vomiting Anesthetic complications: no Comments: Late entry     Last Vitals:  Vitals:   10/09/18 0945 10/09/18 0953  BP: (!) 139/91 119/90  Pulse: 80 72  Resp: 17 18  Temp:    SpO2: 94% 94%    Last Pain:  Vitals:   10/09/18 0953  TempSrc:   PainSc: 0-No pain                 Ronelle Michie

## 2018-10-09 NOTE — Anesthesia Preprocedure Evaluation (Signed)
Anesthesia Evaluation  Patient identified by MRN, date of birth, ID band Patient awake    Reviewed: Allergy & Precautions, H&P , NPO status , Patient's Chart, lab work & pertinent test results  Airway Mallampati: II  TM Distance: >3 FB Neck ROM: full    Dental no notable dental hx.    Pulmonary neg pulmonary ROS, former smoker,    Pulmonary exam normal breath sounds clear to auscultation       Cardiovascular Exercise Tolerance: Good negative cardio ROS   Rhythm:regular Rate:Normal     Neuro/Psych  Neuromuscular disease negative psych ROS   GI/Hepatic Neg liver ROS, GERD  ,  Endo/Other  negative endocrine ROS  Renal/GU negative Renal ROS  negative genitourinary   Musculoskeletal   Abdominal   Peds  Hematology negative hematology ROS (+)   Anesthesia Other Findings   Reproductive/Obstetrics negative OB ROS                             Anesthesia Physical Anesthesia Plan  ASA: II  Anesthesia Plan: MAC   Post-op Pain Management:    Induction:   PONV Risk Score and Plan:   Airway Management Planned:   Additional Equipment:   Intra-op Plan:   Post-operative Plan:   Informed Consent: I have reviewed the patients History and Physical, chart, labs and discussed the procedure including the risks, benefits and alternatives for the proposed anesthesia with the patient or authorized representative who has indicated his/her understanding and acceptance.   Dental Advisory Given  Plan Discussed with: CRNA  Anesthesia Plan Comments:         Anesthesia Quick Evaluation

## 2018-10-09 NOTE — Discharge Instructions (Signed)

## 2018-10-09 NOTE — H&P (Signed)
.   HISTORY AND PHYSICAL INTERVAL NOTE:  10/09/2018  7:12 AM  Arthur Aguilar  has presented today for surgery, with the diagnosis of right painful 2nd hammer toe.  The various methods of treatment have been discussed with the patient.  No guarantees were given.  After consideration of risks, benefits and other options for treatment, the patient has consented to surgery.  I have reviewed the patients' chart and labs.    Patient Vitals for the past 24 hrs:  BP Temp Temp src Pulse Resp SpO2 Height Weight  10/09/18 0711 115/74 - - 80 18 94 % - -  10/09/18 0655 120/79 98 F (36.7 C) Oral 84 12 95 % 5\' 11"  (1.803 m) 113.4 kg    A history and physical examination was performed in my office.  The patient was reexamined.  There have been no changes to this history and physical examination.  [name], DPM

## 2018-10-09 NOTE — Transfer of Care (Signed)
Immediate Anesthesia Transfer of Care Note  Patient: CHANCELLOR VANDERLOOP  Procedure(s) Performed: ARTHRODESIS 2ND DIGIT RIGHT FOOT (Right Foot) EXTENSOR TENDON LENGTHENING (Right Foot) FLEXOR TENOTOMY (Right Foot) right second  MPJ CAPSULOTOMY (Right Foot)  Patient Location: PACU  Anesthesia Type:MAC  Level of Consciousness: awake, alert  and oriented  Airway & Oxygen Therapy: Patient Spontanous Breathing  Post-op Assessment: Report given to RN  Post vital signs: Reviewed and stable  Last Vitals:  Vitals Value Taken Time  BP 140/95 10/09/2018  9:15 AM  Temp    Pulse 81 10/09/2018  9:16 AM  Resp 20 10/09/2018  9:16 AM  SpO2 95 % 10/09/2018  9:16 AM  Vitals shown include unvalidated device data.  Last Pain:  Vitals:   10/09/18 0655  TempSrc: Oral  PainSc: 4       Patients Stated Pain Goal: 7 (99/24/26 8341)  Complications: No apparent anesthesia complications

## 2018-10-09 NOTE — Brief Op Note (Signed)
10/09/2018  9:16 AM  PATIENT:  Arthur Aguilar  57 y.o. male  PRE-OPERATIVE DIAGNOSIS:  right painful 2nd hammer toe  POST-OPERATIVE DIAGNOSIS:  right painful 2nd hammer toe  PROCEDURE:  Procedure(s): ARTHRODESIS 2ND DIGIT RIGHT FOOT (Right) EXTENSOR TENDON LENGTHENING (Right) FLEXOR TENOTOMY (Right) right second  MPJ CAPSULOTOMY (Right)  SURGEON:  Surgeon(s) and Role:    * [name], DPM - Primary  PHYSICIAN ASSISTANT: none.   ANESTHESIA:   local and IV sedation  EBL:  10 mL   BLOOD ADMINISTERED:none  DRAINS: none   LOCAL MEDICATIONS USED:  MARCAINE   , LIDOCAINE  and Amount: 10 ml pre op and 37ml Post op.   SPECIMEN:  No Specimen  DISPOSITION OF SPECIMEN:  N/A  COUNTS:  YES  TOURNIQUET:   Total Tourniquet Time Documented: Calf (Right) - 62 minutes Total: Calf (Right) - 62 minutes   DICTATION: .Viviann Spare Dictation  PLAN OF CARE: Discharge to home after PACU  PATIENT DISPOSITION:  PACU - hemodynamically stable.   Delay start of Pharmacological VTE agent (>24hrs) due to surgical blood loss or risk of bleeding: not applicable

## 2018-10-10 ENCOUNTER — Encounter (HOSPITAL_COMMUNITY): Payer: Self-pay | Admitting: Podiatry

## 2019-06-16 ENCOUNTER — Ambulatory Visit (INDEPENDENT_AMBULATORY_CARE_PROVIDER_SITE_OTHER): Payer: Managed Care, Other (non HMO)

## 2019-06-16 ENCOUNTER — Other Ambulatory Visit: Payer: Self-pay

## 2019-06-16 ENCOUNTER — Ambulatory Visit: Payer: Managed Care, Other (non HMO) | Admitting: Orthopedic Surgery

## 2019-06-16 VITALS — BP 136/89 | HR 81 | Temp 97.1°F | Ht 72.0 in | Wt 238.0 lb

## 2019-06-16 DIAGNOSIS — Z96642 Presence of left artificial hip joint: Secondary | ICD-10-CM

## 2019-06-16 DIAGNOSIS — M13851 Other specified arthritis, right hip: Secondary | ICD-10-CM | POA: Diagnosis not present

## 2019-06-16 NOTE — Progress Notes (Signed)
Chief Complaint  Patient presents with  . Follow-up    Recheck on lef thip replacement, DOS 12-14-09.   Patient ID: Arthur Aguilar, male   DOB: [birth year], 58 y.o.   MRN: [id number]  Chief Complaint  Patient presents with  . Follow-up    Recheck on lef thip replacement, DOS 12-14-09.    HPI Arthur Aguilar is a 58 y.o. male.   Status post  total hip arthroplasty. The patient is doing well the hip implant is functioning well.  Review of Systems Review of Systems  Constitutional symptoms no fever or chills Neurologic symptoms no numbness or tingling  Right and left shoulders are doing well  Right hip sore sometimes    Past Medical History:  Diagnosis Date  . Arthritis   . GERD (gastroesophageal reflux disease)   . History of kidney stones   . Hypercholesteremia   . Scoliosis     Past Surgical History:  Procedure Laterality Date  . CAPSULOTOMY Right 10/09/2018   Procedure: right second  MPJ CAPSULOTOMY;  Surgeon: [name], DPM;  Location: [facility];  Service: Podiatry;  Laterality: Right;  . CARPAL TUNNEL RELEASE Right 04/02/2015   Procedure: RIGHT CARPAL TUNNEL RELEASE;  Surgeon: [name], MD;  Location: [facility];  Service: Orthopedics;  Laterality: Right;  . dental bone implant     bottom - right jaw.  Marland Kitchen FLEXOR TENOTOMY Right 10/09/2018   Procedure: FLEXOR TENOTOMY;  Surgeon: [name], DPM;  Location: [facility];  Service: Podiatry;  Laterality: Right;  . FOOT ARTHRODESIS Right 10/09/2018   Procedure: ARTHRODESIS 2ND DIGIT RIGHT FOOT;  Surgeon: [name], DPM;  Location: [facility];  Service: Podiatry;  Laterality: Right;  . HIP SURGERY    . left hip replacement    . SHOULDER ARTHROSCOPY WITH SUBACROMIAL DECOMPRESSION, ROTATOR CUFF REPAIR AND BICEP TENDON REPAIR Right 01/31/2018   Procedure: Right Shoulder Arthroscopy, Biceps Tenodesis, Mini Open Rotator Cuff Tear Repair, Possible Superior Capsular Reconstruction;  Surgeon: [name],  MD;  Location: [facility];  Service: Orthopedics;  Laterality: Right;  . SHOULDER OPEN ROTATOR CUFF REPAIR Left 05/30/2017   Procedure: ROTATOR CUFF REPAIR SHOULDER OPEN;  Surgeon: [name], MD;  Location: [facility];  Service: Orthopedics;  Laterality: Left;  . TENDON LENGTHENING Right 10/09/2018   Procedure: EXTENSOR TENDON LENGTHENING;  Surgeon: [name], DPM;  Location: [facility];  Service: Podiatry;  Laterality: Right;  . TOOTH EXTRACTION  01/04/2018    1 tooth     No Known Allergies  Current Outpatient Medications  Medication Sig Dispense Refill  . acetaminophen (TYLENOL) 500 MG tablet Take 1,000 mg by mouth every 6 (six) hours as needed for moderate pain.     . fenofibrate 160 MG tablet Take 160 mg by mouth daily.     . Flaxseed, Linseed, (FLAXSEED OIL) 1200 MG CAPS Take 1,200 mg by mouth 2 (two) times daily.    . Glucosamine HCl (GLUCOSAMINE PO) Take 1,000 mg by mouth daily.     Marland Kitchen HYDROcodone-acetaminophen (NORCO/VICODIN) 5-325 MG tablet 1 po qd prn pain (Patient taking differently: Take 1 tablet by mouth every 6 (six) hours as needed for moderate pain. 1 po qd prn pain) 30 tablet 0  . ibuprofen (ADVIL,MOTRIN) 800 MG tablet Take 1 tablet (800 mg total) by mouth every 8 (eight) hours as needed. (Patient taking differently: Take 800 mg by mouth every 8 (eight) hours as needed (FOR PAIN/SWELLING). ) 90 tablet 1  . loratadine (ALLERGY RELIEF)  10 MG tablet Take 10 mg by mouth daily as needed (FOR SEASONAL ALLERGIES--SPRING/SUMMER).     . Melatonin 5 MG TABS Take 5 mg by mouth at bedtime.    . Multiple Vitamin (MULTIVITAMIN WITH MINERALS) TABS tablet Take 1 tablet by mouth daily. (Men's 50+)    . Omega-3 Fatty Acids (FISH OIL) 1000 MG CAPS Take 2,000 mg by mouth 2 (two) times daily.    Marland Kitchen omeprazole (PRILOSEC OTC) 20 MG tablet Take 20 mg by mouth daily.     No current facility-administered medications for this visit.      Physical Exam Blood pressure 136/89, pulse 81,  temperature (!) 97.1 F (36.2 C), height 6' (1.829 m), weight 238 lb (108 kg).  Overall appearance normal grooming and hygiene normal. The patient is awake alert and oriented 3 with a pleasant mood and affect. The patient is ambulatory without assistive device and without limping.  Left HIP: The skin incision is healed without any erythema or tenderness or neuroma. Hip flexion strength grade 5. Hip is stable to post pull and abduction stress test. Hip flexion is 120. There is no tenderness or swelling around the hip. Distal pulses are intact sensation is normal and there is no lymphadenopathy in the groin patient walks with normal balance and coordination  Right hip goes into external rotation but has normal flexion and is pain-free he has about 15 degrees of internal rotation with the hip flexed  Data Reviewed X-rays were ordered today, separate report was dictated.  My interpretation of the x-ray today start: History today's imaging shows stable left total hip implant without loosening or complication  Right hip arthritis moderate  Assessment Encounter Diagnoses  Name Primary?  . History of left hip replacement Yes  . Allergic arthritis of right hip      Plan Routine repeat x-ray right hip in one year  9:24 AM [name], MD 06/16/2019

## 2019-09-17 IMAGING — DX DG FOOT COMPLETE 3+V*R*
3 series · 3 of 3 positions shown · non-contrast
Comparison: 08/14/2018

CLINICAL DATA: Right second hammertoe.

EXAM:
RIGHT FOOT COMPLETE - 3+ VIEW

[foot ap]
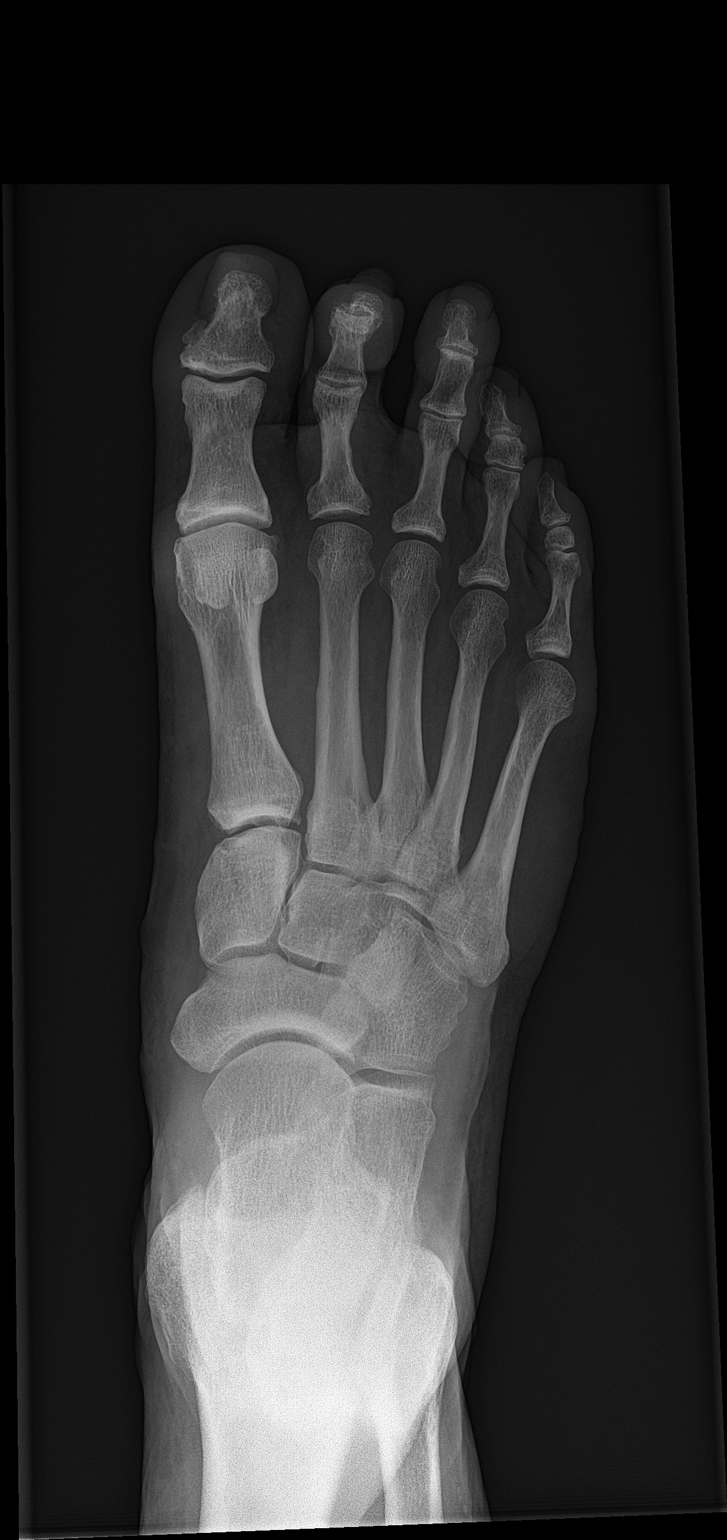

[foot obl]
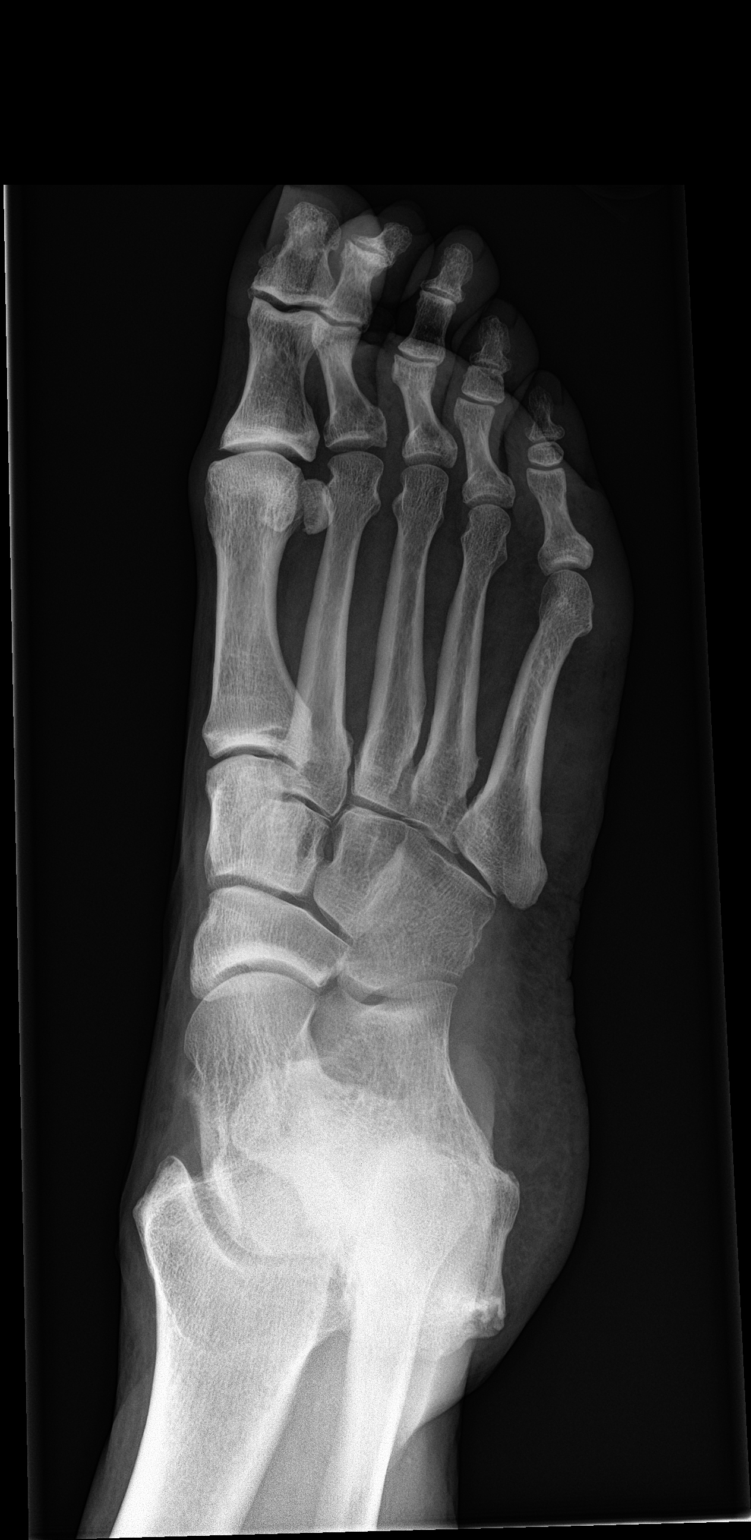

[foot lat]
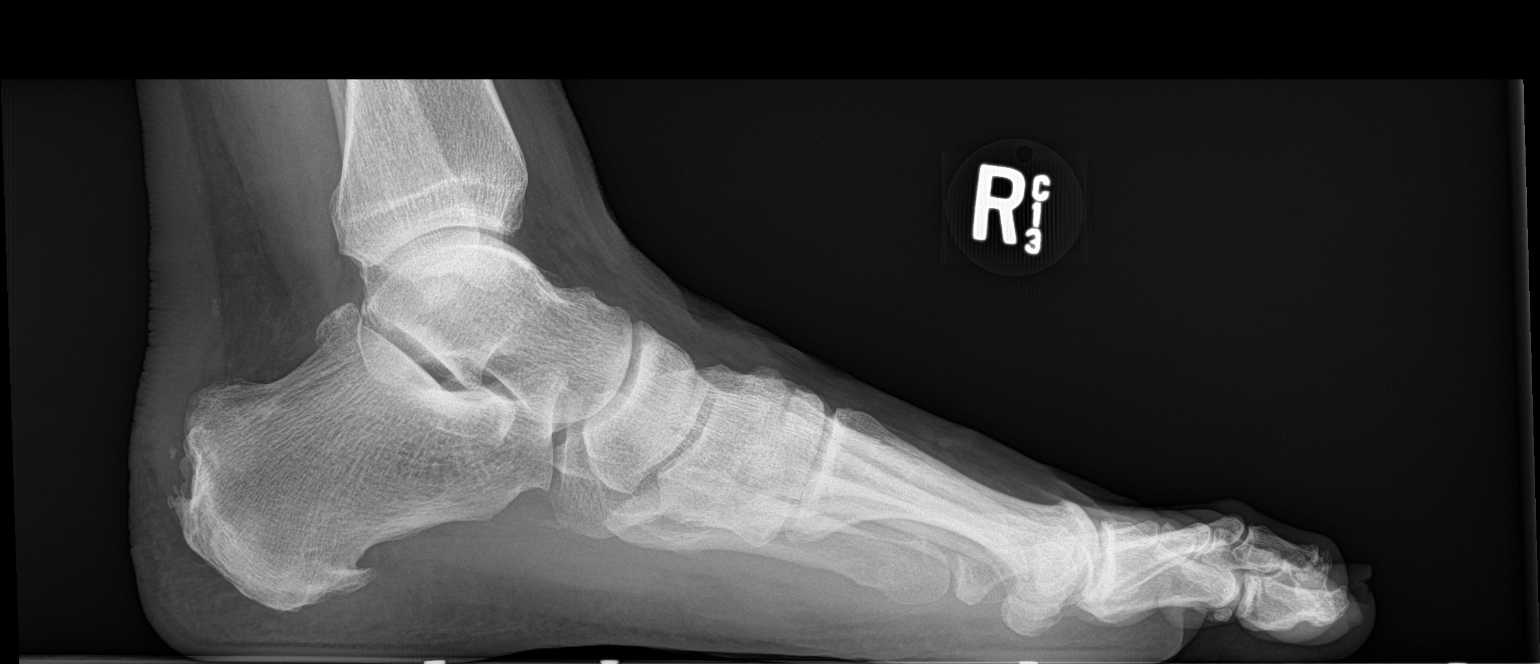

[3 of 3 positions shown; findings below may reference images not displayed]

FINDINGS: Right hammertoe deformity observed with flexion of the right second
toe distal interphalangeal joint to 60 degrees with weight-bearing.

The remaining toes appear unremarkable. No malalignment at the
Lisfranc joint. No fracture or acute bony findings. There is
moderate spurring at the distal interphalangeal joint of the second
toe.

Flattened longitudinal arch of the foot suggesting mild pes planus.
Plantar and Achilles calcaneal spurs.
IMPRESSION: 1. Right second toe hammertoe deformity, with 60 degrees of flexion
of the distal interphalangeal joint with weight-bearing.
2. Flattening of the longitudinal arch of the foot suggesting pes
planus.
3. Degenerative spurring of the second toe distal interphalangeal
joint.

## 2020-02-26 ENCOUNTER — Telehealth: Payer: Self-pay | Admitting: Orthopedic Surgery

## 2020-02-26 NOTE — Telephone Encounter (Signed)
Notified patient that his disability parking placard renewal form has been completed and signed by Dr Aline Brochure and is ready for pick up.

## 2020-06-16 ENCOUNTER — Ambulatory Visit: Payer: Managed Care, Other (non HMO) | Admitting: Orthopedic Surgery

## 2021-06-14 ENCOUNTER — Ambulatory Visit (INDEPENDENT_AMBULATORY_CARE_PROVIDER_SITE_OTHER): Payer: No Typology Code available for payment source | Admitting: Urology

## 2021-06-14 ENCOUNTER — Other Ambulatory Visit: Payer: Self-pay

## 2021-06-14 VITALS — BP 134/80 | HR 80

## 2021-06-14 DIAGNOSIS — R972 Elevated prostate specific antigen [PSA]: Secondary | ICD-10-CM

## 2021-06-14 DIAGNOSIS — R3912 Poor urinary stream: Secondary | ICD-10-CM | POA: Diagnosis not present

## 2021-06-14 DIAGNOSIS — N401 Enlarged prostate with lower urinary tract symptoms: Secondary | ICD-10-CM | POA: Diagnosis not present

## 2021-06-14 DIAGNOSIS — N138 Other obstructive and reflux uropathy: Secondary | ICD-10-CM

## 2021-06-14 DIAGNOSIS — N200 Calculus of kidney: Secondary | ICD-10-CM | POA: Diagnosis not present

## 2021-06-14 LAB — URINALYSIS, ROUTINE W REFLEX MICROSCOPIC
Bilirubin, UA: NEGATIVE
Glucose, UA: NEGATIVE
Ketones, UA: NEGATIVE
Leukocytes,UA: NEGATIVE
Nitrite, UA: NEGATIVE
Specific Gravity, UA: 1.025 (ref 1.005–1.030)
Urobilinogen, Ur: 0.2 mg/dL (ref 0.2–1.0)
pH, UA: 6.5 (ref 5.0–7.5)

## 2021-06-14 LAB — MICROSCOPIC EXAMINATION
Epithelial Cells (non renal): NONE SEEN /hpf (ref 0–10)
RBC: >30 /hpf — AB (ref 0–2)
Renal Epithel, UA: NONE SEEN /hpf
WBC, UA: NONE SEEN /hpf (ref 0–5)

## 2021-06-14 MED ORDER — ALFUZOSIN HCL ER 10 MG PO TB24: 10.0000 mg | ORAL_TABLET | Freq: Every evening | ORAL | 11 refills | Status: DC

## 2021-06-14 NOTE — Progress Notes (Signed)
06/14/2021 3:24 PM   Arthur Aguilar Aug 01, 1961 [id number]  Referring provider: [facility] @ [address],  [place] [address]  Nephrolithiasis   HPI:  Mr Thivierge is a 60yo here for evaluation of nephrolithiasis. He underwetn CT on 05/2021 which showed a 64mm left renal calculi. This is his first stone event. He also has complaints today of difficulty urinating. IPSS 23 QOL 4. No prior BPH therapy. Nocturia 3-5x. Urine stream is fair.  PSA 0.2 Punctate left renal calculi on CT stone study from 05/2021   PMH: Past Medical History:  Diagnosis Date   Arthritis    GERD (gastroesophageal reflux disease)    History of kidney stones    Hypercholesteremia    Scoliosis     Surgical History: Past Surgical History:  Procedure Laterality Date   CAPSULOTOMY Right 10/09/2018   Procedure: right second  MPJ CAPSULOTOMY;  Surgeon: [name], DPM;  Location: [facility];  Service: Podiatry;  Laterality: Right;   CARPAL TUNNEL RELEASE Right 04/02/2015   Procedure: RIGHT CARPAL TUNNEL RELEASE;  Surgeon: [name], MD;  Location: [facility];  Service: Orthopedics;  Laterality: Right;   dental bone implant     bottom - right jaw.   FLEXOR TENOTOMY  Right 10/09/2018   Procedure: FLEXOR TENOTOMY;  Surgeon: [name], DPM;  Location: [facility];  Service: Podiatry;  Laterality: Right;   FOOT ARTHRODESIS Right 10/09/2018   Procedure: ARTHRODESIS 2ND DIGIT RIGHT FOOT;  Surgeon: [name], DPM;  Location: [facility];  Service: Podiatry;  Laterality: Right;   HIP SURGERY     left hip replacement     SHOULDER ARTHROSCOPY WITH SUBACROMIAL DECOMPRESSION, ROTATOR CUFF REPAIR AND BICEP TENDON REPAIR Right 01/31/2018   Procedure: Right Shoulder Arthroscopy, Biceps Tenodesis, Mini Open Rotator Cuff Tear Repair, Possible Superior Capsular Reconstruction;  Surgeon: [name], MD;  Location: [facility];  Service: Orthopedics;  Laterality: Right;    SHOULDER OPEN ROTATOR CUFF REPAIR Left 05/30/2017   Procedure: ROTATOR CUFF REPAIR SHOULDER OPEN;  Surgeon: [name], MD;  Location: [facility];  Service: Orthopedics;  Laterality: Left;   TENDON LENGTHENING Right 10/09/2018   Procedure: EXTENSOR TENDON LENGTHENING;  Surgeon: [name], DPM;  Location: [facility];  Service: Podiatry;  Laterality: Right;   TOOTH EXTRACTION  01/04/2018    1 tooth    Home Medications:  Allergies as of 06/14/2021   No Known Allergies      Medication List        Accurate as of June 14, 2021  3:24 PM. If you have any questions, ask your nurse or doctor.          STOP taking these medications    omeprazole 20 MG tablet Commonly known as: PRILOSEC OTC Stopped by: Nicolette Bang, MD       TAKE these medications    acetaminophen 500 MG tablet Commonly known as: TYLENOL Take 1,000 mg by mouth every 6 (six) hours as needed for moderate pain.   atorvastatin 20 MG tablet Commonly known as: LIPITOR Take 20 mg by mouth daily.   fenofibrate 160 MG tablet Take 160 mg by mouth daily.   Fish Oil 1000 MG Caps Take 2,000 mg by mouth 2 (two) times daily.   Flaxseed Oil 1200 MG Caps Take 1,200 mg by mouth 2 (two) times daily.   GLUCOSAMINE PO Take 1,000 mg by mouth daily.   HYDROcodone-acetaminophen 5-325 MG tablet Commonly known as: NORCO/VICODIN 1 po  qd prn pain What changed:  how much to take how to take this when to take this reasons to take this   ibuprofen 800 MG tablet Commonly known as: ADVIL Take 1 tablet (800 mg total) by mouth every 8 (eight) hours as needed. What changed: reasons to take this   loratadine 10 MG tablet Commonly known as: CLARITIN Take 10 mg by mouth daily as needed (FOR SEASONAL ALLERGIES--SPRING/SUMMER).   melatonin 5 MG Tabs Take 5 mg by mouth at bedtime.   multivitamin with minerals Tabs tablet Take 1 tablet by mouth daily. (Men's 50+)        Allergies: No Known Allergies  Family  History: Family History  Problem Relation Age of Onset   Heart disease Unknown    Arthritis Unknown    Lung disease Unknown    Diabetes Unknown     Social History:  reports that he quit smoking about 12 years ago. His smoking use included cigarettes. He has a 12.50 pack-year smoking history. He has never used smokeless tobacco. He reports current alcohol use of about 14.0 standard drinks of alcohol per week. He reports that he does not use drugs.  ROS: All other review of systems were reviewed and are negative except what is noted above in HPI  Physical Exam: BP 134/80   Pulse 80   Constitutional:  Alert and oriented, No acute distress. HEENT: [place] AT, moist mucus membranes.  Trachea midline, no masses. Cardiovascular: No clubbing, cyanosis, or edema. Respiratory: Normal respiratory effort, no increased work of breathing. GI: Abdomen is soft, nontender, nondistended, no abdominal masses GU: No CVA tenderness. Circumcised phallus. No masses/lesions on penis, testis, scrotum. Prostate 40g smooth no nodules no induration.  Lymph: No cervical or inguinal lymphadenopathy. Skin: No rashes, bruises or suspicious lesions. Neurologic: Grossly intact, no focal deficits, moving all 4 extremities. Psychiatric: Normal mood and affect.  Laboratory Data: Lab Results  Component Value Date   WBC 7.7 09/30/2018   HGB 15.7 09/30/2018   HCT 46.7 09/30/2018   MCV 86.0 09/30/2018   PLT 252 09/30/2018    Lab Results  Component Value Date   CREATININE 0.99 09/30/2018    No results found for: PSA  No results found for: TESTOSTERONE  No results found for: HGBA1C  Urinalysis No results found for: COLORURINE, APPEARANCEUR, LABSPEC, PHURINE, GLUCOSEU, HGBUR, BILIRUBINUR, KETONESUR, PROTEINUR, UROBILINOGEN, NITRITE, LEUKOCYTESUR  No results found for: LABMICR, [place], RBCUA, LABEPIT, MUCUS, BACTERIA  Pertinent Imaging:  No results found for this or any previous visit.  No results found for  this or any previous visit.  No results found for this or any previous visit.  No results found for this or any previous visit.  No results found for this or any previous visit.  No results found for this or any previous visit.  No results found for this or any previous visit.  No results found for this or any previous visit.   Assessment & Plan:    1. Elevated PSA Patients PSa has been low and he should continue yearly surveillance.   2. Kidney stones -RTc 6 months with renal US - Urinalysis, Routine w reflex microscopic  3. Benign prostatic hyperplasia with urinary obstruction -uroxatral 10mg  qhs  4. Weak urinary stream -Uroxatral 10mg  qhs   No follow-ups on file.  Nicolette Bang, MD  [facility] [place]

## 2021-06-14 NOTE — Progress Notes (Signed)
Urological Symptom Review  Patient is experiencing the following symptoms: Stream starts and stops Weak stream Kidney stones Burning/painful urination Get up at night to urinate painful intercourse   Review of Systems  Gastrointestinal (upper)  : Negative for upper GI symptoms  Gastrointestinal (lower) : Negative for lower GI symptoms  Constitutional : Negative for symptoms  Skin: Negative for skin symptoms  Eyes: Negative for eye symptoms  Ear/Nose/Throat : Negative for Ear/Nose/Throat symptoms  Hematologic/Lymphatic: Negative for Hematologic/Lymphatic symptoms  Cardiovascular : Negative for cardiovascular symptoms  Respiratory : Negative for respiratory symptoms  Endocrine: Negative for endocrine symptoms  Musculoskeletal: Negative for musculoskeletal symptoms  Neurological: Negative for neurological symptoms  Psychologic: Negative for psychiatric symptoms

## 2021-07-01 ENCOUNTER — Telehealth: Payer: Self-pay

## 2021-07-01 NOTE — Telephone Encounter (Signed)
  Patient was given alfuzosin (UROXATRAL) 10 MG 24 hr tablet it's till not helping with weak stream.   Urine stream is still weak.    Please advise. Call back:  [phone]  Thanks, Helene Kelp

## 2021-07-04 NOTE — Telephone Encounter (Signed)
Can patient increase to BID?

## 2021-07-05 ENCOUNTER — Other Ambulatory Visit: Payer: Self-pay

## 2021-07-05 DIAGNOSIS — R3912 Poor urinary stream: Secondary | ICD-10-CM

## 2021-07-05 DIAGNOSIS — N138 Other obstructive and reflux uropathy: Secondary | ICD-10-CM

## 2021-07-05 MED ORDER — ALFUZOSIN HCL ER 10 MG PO TB24: 10.0000 mg | ORAL_TABLET | Freq: Two times a day (BID) | ORAL | 11 refills | Status: DC

## 2021-07-05 NOTE — Telephone Encounter (Signed)
Rx changed and sent to pharmacy. Patient called and notified.

## 2021-07-25 NOTE — Telephone Encounter (Signed)
Patient states that he still does not have a good stream after increasing uroxatral to bid. He states he is only dripping even when he strains. He states he can void if he needs to the stream is just weak and he does not know if he is emptying his bladder completely. Patient states he feels he is voiding and adequate amount of urine. Please advise.

## 2021-07-27 NOTE — Telephone Encounter (Signed)
Patient feels his urinary flow is being restricted even being on alfuzosin BID.  Message sent to MD for advice.

## 2021-07-29 NOTE — Telephone Encounter (Signed)
Scheduled appt with patient for cysto

## 2021-08-09 ENCOUNTER — Telehealth: Payer: Self-pay

## 2021-08-09 NOTE — Telephone Encounter (Signed)
Patient called and made aware.

## 2021-08-09 NOTE — Telephone Encounter (Signed)
Patient states that he no longer has an issue with his urine stream. He states that he has been trying to pass a kidney stone for the past few days. He states he can feel it near the head of his penis. Patient has an appointment for Friday for cysto and wonders if he needs to come since he is longer having trouble voiding and the stone is at the head of his penis. Patient advised to keep his appointment and that you would be made aware of his current situation. Please advise.

## 2021-08-12 ENCOUNTER — Other Ambulatory Visit: Payer: Self-pay

## 2021-08-12 ENCOUNTER — Ambulatory Visit (INDEPENDENT_AMBULATORY_CARE_PROVIDER_SITE_OTHER): Payer: No Typology Code available for payment source | Admitting: Urology

## 2021-08-12 ENCOUNTER — Encounter: Payer: Self-pay | Admitting: Urology

## 2021-08-12 VITALS — BP 138/86 | HR 102

## 2021-08-12 DIAGNOSIS — N200 Calculus of kidney: Secondary | ICD-10-CM | POA: Diagnosis not present

## 2021-08-12 DIAGNOSIS — R3912 Poor urinary stream: Secondary | ICD-10-CM | POA: Diagnosis not present

## 2021-08-12 DIAGNOSIS — N401 Enlarged prostate with lower urinary tract symptoms: Secondary | ICD-10-CM

## 2021-08-12 DIAGNOSIS — N138 Other obstructive and reflux uropathy: Secondary | ICD-10-CM | POA: Diagnosis not present

## 2021-08-12 LAB — URINALYSIS, ROUTINE W REFLEX MICROSCOPIC
Bilirubin, UA: NEGATIVE
Ketones, UA: NEGATIVE
Leukocytes,UA: NEGATIVE
Nitrite, UA: NEGATIVE
Protein,UA: NEGATIVE
Specific Gravity, UA: 1.02 (ref 1.005–1.030)
Urobilinogen, Ur: 0.2 mg/dL (ref 0.2–1.0)
pH, UA: 6 (ref 5.0–7.5)

## 2021-08-12 LAB — MICROSCOPIC EXAMINATION
Bacteria, UA: NONE SEEN
Epithelial Cells (non renal): NONE SEEN /hpf (ref 0–10)
Renal Epithel, UA: NONE SEEN /hpf
WBC, UA: NONE SEEN /hpf (ref 0–5)

## 2021-08-12 MED ORDER — CIPROFLOXACIN HCL 500 MG PO TABS: 500.0000 mg | ORAL_TABLET | Freq: Once | ORAL | Status: DC

## 2021-08-12 MED ORDER — ALFUZOSIN HCL ER 10 MG PO TB24: 10.0000 mg | ORAL_TABLET | Freq: Two times a day (BID) | ORAL | 11 refills | Status: DC

## 2021-08-12 NOTE — Patient Instructions (Signed)
Benign Prostatic Hyperplasia Benign prostatic hyperplasia (BPH) is an enlarged prostate gland that is caused by the normal aging process and not by cancer. The prostate is a walnut-sized gland that is involved in the production of semen. It is located in front of the rectum and below the bladder. The bladder stores urine and the urethra is the tube that carries the urine out of the body. The prostate may get bigger as a man gets older. An enlarged prostate can press on the urethra. This can make it harder to pass urine. The build-up of urine in the bladder can cause infection. Back pressure and infection may progress to bladder damage and kidney (renal) failure. What are the causes? This condition is part of a normal aging process. However, not all men develop problems from this condition. If the prostate enlarges away from the urethra, urine flow will not be blocked. If it enlarges toward the urethra and compresses it, there will be problems passing urine. What increases the risk? This condition is more likely to develop in men over the age of 50 years. What are the signs or symptoms? Symptoms of this condition include: Getting up often during the night to urinate. Needing to urinate frequently during the day. Difficulty starting urine flow. Decrease in size and strength of your urine stream. Leaking (dribbling) after urinating. Inability to pass urine. This needs immediate treatment. Inability to completely empty your bladder. Pain when you pass urine. This is more common if there is also an infection. Urinary tract infection (UTI). How is this diagnosed? This condition is diagnosed based on your medical history, a physical exam, and your symptoms. Tests will also be done, such as: A post-void bladder scan. This measures any amount of urine that may remain in your bladder after you finish urinating. A digital rectal exam. In a rectal exam, your health care provider checks your prostate by  putting a lubricated, gloved finger into your rectum to feel the back of your prostate gland. This exam detects the size of your gland and any abnormal lumps or growths. An exam of your urine (urinalysis). A prostate specific antigen (PSA) screening. This is a blood test used to screen for prostate cancer. An ultrasound. This test uses sound waves to electronically produce a picture of your prostate gland. Your health care provider may refer you to a specialist in kidney and prostate diseases (urologist). How is this treated? Once symptoms begin, your health care provider will monitor your condition (active surveillance or watchful waiting). Treatment for this condition will depend on the severity of your condition. Treatment may include: Observation and yearly exams. This may be the only treatment needed if your condition and symptoms are mild. Medicines to relieve your symptoms, including: Medicines to shrink the prostate. Medicines to relax the muscle of the prostate. Surgery in severe cases. Surgery may include: Prostatectomy. In this procedure, the prostate tissue is removed completely through an open incision or with a laparoscope or robotics. Transurethral resection of the prostate (TURP). In this procedure, a tool is inserted through the opening at the tip of the penis (urethra). It is used to cut away tissue of the inner core of the prostate. The pieces are removed through the same opening of the penis. This removes the blockage. Transurethral incision (TUIP). In this procedure, small cuts are made in the prostate. This lessens the prostate's pressure on the urethra. Transurethral microwave thermotherapy (TUMT). This procedure uses microwaves to create heat. The heat destroys and removes a   small amount of prostate tissue. Transurethral needle ablation (TUNA). This procedure uses radio frequencies to destroy and remove a small amount of prostate tissue. Interstitial laser coagulation (ILC).  This procedure uses a laser to destroy and remove a small amount of prostate tissue. Transurethral electrovaporization (TUVP). This procedure uses electrodes to destroy and remove a small amount of prostate tissue. Prostatic urethral lift. This procedure inserts an implant to push the lobes of the prostate away from the urethra. Follow these instructions at home: Take over-the-counter and prescription medicines only as told by your health care provider. Monitor your symptoms for any changes. Contact your health care provider with any changes. Avoid drinking large amounts of liquid before going to bed or out in public. Avoid or reduce how much caffeine or alcohol you drink. Give yourself time when you urinate. Keep all follow-up visits as told by your health care provider. This is important. Contact a health care provider if: You have unexplained back pain. Your symptoms do not get better with treatment. You develop side effects from the medicine you are taking. Your urine becomes very dark or has a bad smell. Your lower abdomen becomes distended and you have trouble passing your urine. Get help right away if: You have a fever or chills. You suddenly cannot urinate. You feel lightheaded, or very dizzy, or you faint. There are large amounts of blood or clots in the urine. Your urinary problems become hard to manage. You develop moderate to severe low back or flank pain. The flank is the side of your body between the ribs and the hip. These symptoms may represent a serious problem that is an emergency. Do not wait to see if the symptoms will go away. Get medical help right away. Call your local emergency services (911 in the [place] Do not drive yourself to the hospital. Summary Benign prostatic hyperplasia (BPH) is an enlarged prostate that is caused by the normal aging process and not by cancer. An enlarged prostate can press on the urethra. This can make it hard to pass urine. This  condition is part of a normal aging process and is more likely to develop in men over the age of 50 years. Get help right away if you suddenly cannot urinate. This information is not intended to replace advice given to you by your health care provider. Make sure you discuss any questions you have with your health care provider. Document Revised: 03/02/2021 Document Reviewed: 07/29/2020 Elsevier Patient Education  2022 [employer]  

## 2021-08-12 NOTE — Progress Notes (Signed)
08/12/2021 9:32 AM   Arthur Aguilar May 31, 1961 [id number]  Referring provider: [facility] @ [address],  [place] [address]  Followup weak urinary stream  HPI: Arthur Aguilar is a 60yo here for followup for BPH with weak urinary stream and nephrolithiasis. He continued to have a weak urinary stream on multiple alpha blockers and within the last 2 weeks he developed a weaker stream and passed a 14-34m calculus. He urine stream is now strong. He stopped his alfuzosin and his LUTS have not worsened. He denies any hematuria or dysuria. No other complaints today.    PMH: Past Medical History:  Diagnosis Date   Arthritis    GERD (gastroesophageal reflux disease)    History of kidney stones    Hypercholesteremia    Scoliosis     Surgical History: Past Surgical History:  Procedure Laterality Date   CAPSULOTOMY Right 10/09/2018   Procedure: right second  MPJ CAPSULOTOMY;  Surgeon: PTyson Babinski DPM;  Location: [facility];  Service: Podiatry;  Laterality: Right;   CARPAL TUNNEL RELEASE Right 04/02/2015   Procedure: RIGHT CARPAL TUNNEL RELEASE;  Surgeon: SCarole Civil MD;  Location: [facility];  Service: Orthopedics;  Laterality: Right;   dental bone implant     bottom - right jaw.   FLEXOR TENOTOMY  Right 10/09/2018   Procedure: FLEXOR TENOTOMY;  Surgeon: PTyson Babinski DPM;  Location: [facility];  Service: Podiatry;  Laterality: Right;   FOOT ARTHRODESIS Right 10/09/2018   Procedure: ARTHRODESIS 2ND DIGIT RIGHT FOOT;  Surgeon: PTyson Babinski DPM;  Location: [facility];  Service: Podiatry;  Laterality: Right;   HIP SURGERY     left hip replacement     SHOULDER ARTHROSCOPY WITH SUBACROMIAL DECOMPRESSION, ROTATOR CUFF REPAIR AND BICEP TENDON REPAIR Right 01/31/2018   Procedure: Right Shoulder Arthroscopy, Biceps Tenodesis, Mini Open Rotator Cuff Tear Repair, Possible Superior Capsular Reconstruction;  Surgeon: DMeredith Pel MD;  [facility]  Service: Orthopedics;  Laterality: Right;   SHOULDER OPEN ROTATOR CUFF REPAIR Left 05/30/2017   Procedure: ROTATOR CUFF REPAIR SHOULDER OPEN;  Surgeon: HCarole Civil MD;  Location: [facility];  Service: Orthopedics;  Laterality: Left;   TENDON LENGTHENING Right 10/09/2018   Procedure: EXTENSOR TENDON LENGTHENING;  Surgeon: PTyson Babinski DPM;  Location: [facility];  Service: Podiatry;  Laterality: Right;   TOOTH EXTRACTION  01/04/2018    1 tooth    Home Medications:  Allergies as of 08/12/2021   No Known Allergies      Medication List        Accurate as of August 12, 2021  9:32 AM. If you have any questions, ask your nurse or doctor.          acetaminophen 500 MG tablet Commonly known as: TYLENOL Take 1,000 mg by mouth every 6 (six) hours as needed for moderate pain.   alfuzosin 10 MG 24 hr tablet Commonly known as: UROXATRAL Take 1 tablet (10 mg total) by mouth in the morning and at bedtime.   atorvastatin 20 MG tablet Commonly known as: LIPITOR Take 20 mg by mouth daily.   fenofibrate 160 MG tablet Take 160 mg by mouth daily.   Fish Oil 1000 MG Caps Take 2,000 mg by mouth 2 (two) times daily.   Flaxseed Oil 1200 MG Caps Take 1,200 mg by mouth 2 (two) times daily.   GLUCOSAMINE PO Take 1,000 mg by mouth daily.   HYDROcodone-acetaminophen 5-325 MG tablet Commonly known as: NORCO/VICODIN  1 po qd prn pain What changed:  how much to take how to take this when to take this reasons to take this   ibuprofen 800 MG tablet Commonly known as: ADVIL Take 1 tablet (800 mg total) by mouth every 8 (eight) hours as needed. What changed: reasons to take this   loratadine 10 MG tablet Commonly known as: CLARITIN Take 10 mg by mouth daily as needed (FOR SEASONAL ALLERGIES--SPRING/SUMMER).   melatonin 5 MG Tabs Take 5 mg by mouth at bedtime.   multivitamin with minerals Tabs tablet Take 1 tablet by mouth daily. (Men's 50+)        Allergies: No  Known Allergies  Family History: Family History  Problem Relation Age of Onset   Heart disease Unknown    Arthritis Unknown    Lung disease Unknown    Diabetes Unknown     Social History:  reports that he quit smoking about 12 years ago. His smoking use included cigarettes. He has a 12.50 pack-year smoking history. He has never used smokeless tobacco. He reports current alcohol use of about 14.0 standard drinks per week. He reports that he does not use drugs.  ROS: All other review of systems were reviewed and are negative except what is noted above in HPI  Physical Exam: BP 138/86   Pulse (!) 102   Constitutional:  Alert and oriented, No acute distress. HEENT: [place] AT, moist mucus membranes.  Trachea midline, no masses. Cardiovascular: No clubbing, cyanosis, or edema. Respiratory: Normal respiratory effort, no increased work of breathing. GI: Abdomen is soft, nontender, nondistended, no abdominal masses GU: No CVA tenderness.  Lymph: No cervical or inguinal lymphadenopathy. Skin: No rashes, bruises or suspicious lesions. Neurologic: Grossly intact, no focal deficits, moving all 4 extremities. Psychiatric: Normal mood and affect.  Laboratory Data: Lab Results  Component Value Date   WBC 7.7 09/30/2018   HGB 15.7 09/30/2018   HCT 46.7 09/30/2018   MCV 86.0 09/30/2018   PLT 252 09/30/2018    Lab Results  Component Value Date   CREATININE 0.99 09/30/2018    No results found for: PSA  No results found for: TESTOSTERONE  No results found for: HGBA1C  Urinalysis    Component Value Date/Time   APPEARANCEUR Clear 06/14/2021 1517   GLUCOSEU Negative 06/14/2021 1517   BILIRUBINUR Negative 06/14/2021 1517   PROTEINUR 1+ (A) 06/14/2021 1517   NITRITE Negative 06/14/2021 1517   LEUKOCYTESUR Negative 06/14/2021 1517    Lab Results  Component Value Date   LABMICR See below: 06/14/2021   WBCUA None seen 06/14/2021   LABEPIT None seen 06/14/2021   MUCUS Present  06/14/2021   BACTERIA Few 06/14/2021    Pertinent Imaging:  No results found for this or any previous visit.  No results found for this or any previous visit.  No results found for this or any previous visit.  No results found for this or any previous visit.  No results found for this or any previous visit.  No results found for this or any previous visit.  No results found for this or any previous visit.  No results found for this or any previous visit.   Assessment & Plan:    1. Benign prostatic hyperplasia with urinary obstruction -we will defer therapy at this time - Urinalysis, Routine w reflex microscopic - ciprofloxacin (CIPRO) tablet 500 mg  2. Weak urinary stream -resolved  3. Nephrolithiasis -RTC January with renal US   No follow-ups on file.  Saralyn Pilar  [place], [facility] [place]

## 2021-08-12 NOTE — Progress Notes (Signed)

## 2021-10-20 ENCOUNTER — Other Ambulatory Visit: Payer: Self-pay

## 2021-10-20 ENCOUNTER — Ambulatory Visit (INDEPENDENT_AMBULATORY_CARE_PROVIDER_SITE_OTHER): Payer: No Typology Code available for payment source | Admitting: Orthopedic Surgery

## 2021-10-20 ENCOUNTER — Encounter: Payer: Self-pay | Admitting: Orthopedic Surgery

## 2021-10-20 VITALS — BP 164/96 | HR 81 | Ht 70.0 in | Wt 245.0 lb

## 2021-10-20 DIAGNOSIS — E119 Type 2 diabetes mellitus without complications: Secondary | ICD-10-CM | POA: Insufficient documentation

## 2021-10-20 DIAGNOSIS — M65331 Trigger finger, right middle finger: Secondary | ICD-10-CM | POA: Diagnosis not present

## 2021-10-20 DIAGNOSIS — E785 Hyperlipidemia, unspecified: Secondary | ICD-10-CM | POA: Insufficient documentation

## 2021-10-20 DIAGNOSIS — E1165 Type 2 diabetes mellitus with hyperglycemia: Secondary | ICD-10-CM | POA: Insufficient documentation

## 2021-10-20 DIAGNOSIS — Z860100 Personal history of colon polyps, unspecified: Secondary | ICD-10-CM | POA: Insufficient documentation

## 2021-10-20 DIAGNOSIS — J309 Allergic rhinitis, unspecified: Secondary | ICD-10-CM | POA: Insufficient documentation

## 2021-10-20 DIAGNOSIS — M1990 Unspecified osteoarthritis, unspecified site: Secondary | ICD-10-CM | POA: Insufficient documentation

## 2021-10-20 DIAGNOSIS — K760 Fatty (change of) liver, not elsewhere classified: Secondary | ICD-10-CM | POA: Insufficient documentation

## 2021-10-20 DIAGNOSIS — K219 Gastro-esophageal reflux disease without esophagitis: Secondary | ICD-10-CM | POA: Insufficient documentation

## 2021-10-20 DIAGNOSIS — E6609 Other obesity due to excess calories: Secondary | ICD-10-CM | POA: Insufficient documentation

## 2021-10-20 DIAGNOSIS — E1169 Type 2 diabetes mellitus with other specified complication: Secondary | ICD-10-CM | POA: Insufficient documentation

## 2021-10-20 NOTE — Progress Notes (Signed)
New problem  Chief Complaint  Patient presents with   Hand Problem    Right long finger triggering     60 year old male previous carpal tunnel release catching locking right long finger times  Exam he has tenderness over the A1 pulley of the long finger and he can make it lock and catch she can manually and without manipulation get it back to extension.  Neurovascular exam is intact  Diagnosis consistent with trigger finger right long finger  Recommend injection  Trigger finger injection  Diagnosis right long finger, trigger finger Procedure injection A1 pulley Medications lidocaine 1% 1 mL and Depo-Medrol 40 mg 1 mL Skin prep alcohol and ethyl chloride Verbal consent was obtained Timeout confirmed the injection site  After cleaning the skin with alcohol and anesthetizing the skin with ethyl chloride the A1 pulley was palpated and the injection was performed without complication

## 2021-11-03 ENCOUNTER — Encounter: Payer: Self-pay | Admitting: Orthopedic Surgery

## 2021-11-03 ENCOUNTER — Ambulatory Visit (INDEPENDENT_AMBULATORY_CARE_PROVIDER_SITE_OTHER): Payer: No Typology Code available for payment source | Admitting: Orthopedic Surgery

## 2021-11-03 ENCOUNTER — Other Ambulatory Visit: Payer: Self-pay

## 2021-11-03 DIAGNOSIS — M65331 Trigger finger, right middle finger: Secondary | ICD-10-CM

## 2021-11-03 NOTE — Progress Notes (Signed)
FOLLOW UP   Encounter Diagnosis  Name Primary?   Trigger middle finger of right hand Yes     Chief Complaint  Patient presents with   Hand Problem    Right middle trigger finger much better after injection      RLF: normal ROM, no tenderness  D/C

## 2021-12-07 ENCOUNTER — Telehealth: Payer: Self-pay

## 2021-12-07 NOTE — Telephone Encounter (Signed)
Patient called and advised that his insurance advised him that someone from our office needed to contact [facility] [phone] about a single case agreement. Patient advised that his insurance needs our facility or [name] to provide the estimated cost of the upcoming renal U/S. Patient advised he has contacted the Billing dept with no luck. Patients insurance is [employer] multiplan. Who would be able to assist with providing this for the insurance. I thought the billing could.   I contact the pre-service dept at left a voicemail regarding same

## 2021-12-12 ENCOUNTER — Ambulatory Visit: Payer: No Typology Code available for payment source | Admitting: Urology

## 2021-12-12 ENCOUNTER — Other Ambulatory Visit: Payer: Self-pay

## 2021-12-12 ENCOUNTER — Ambulatory Visit (HOSPITAL_COMMUNITY)
Admission: RE | Admit: 2021-12-12 | Discharge: 2021-12-12 | Disposition: A | Discharge status: Home / Self Care | Payer: No Typology Code available for payment source | Source: Ambulatory Visit | Attending: Urology | Admitting: Urology

## 2021-12-12 DIAGNOSIS — N200 Calculus of kidney: Secondary | ICD-10-CM | POA: Diagnosis present

## 2021-12-19 ENCOUNTER — Ambulatory Visit: Payer: Self-pay | Admitting: Urology

## 2022-01-24 ENCOUNTER — Telehealth: Payer: Self-pay

## 2022-01-24 NOTE — Telephone Encounter (Signed)
-----   Message from Cleon Gustin, MD sent at 01/24/2022 11:55 AM EST ----- He needs a followup in the next 3-4 weeks ----- Message ----- From: Iris Pert, LPN Sent: 03/06/3294   8:53 AM EST To: Cleon Gustin, MD  Please review

## 2022-01-24 NOTE — Telephone Encounter (Signed)
Patient called with no answer. Appointment made. Detailed message left.

## 2022-02-13 ENCOUNTER — Ambulatory Visit: Payer: No Typology Code available for payment source | Admitting: Urology

## 2022-05-23 ENCOUNTER — Encounter (INDEPENDENT_AMBULATORY_CARE_PROVIDER_SITE_OTHER): Payer: Self-pay | Admitting: *Deleted

## 2022-06-08 ENCOUNTER — Other Ambulatory Visit (INDEPENDENT_AMBULATORY_CARE_PROVIDER_SITE_OTHER): Payer: Self-pay

## 2022-06-08 ENCOUNTER — Encounter (INDEPENDENT_AMBULATORY_CARE_PROVIDER_SITE_OTHER): Payer: Self-pay | Admitting: *Deleted

## 2022-06-08 DIAGNOSIS — Z86010 Personal history of colonic polyps: Secondary | ICD-10-CM

## 2022-06-20 ENCOUNTER — Telehealth (INDEPENDENT_AMBULATORY_CARE_PROVIDER_SITE_OTHER): Payer: Self-pay

## 2022-06-20 ENCOUNTER — Encounter (INDEPENDENT_AMBULATORY_CARE_PROVIDER_SITE_OTHER): Payer: Self-pay

## 2022-06-20 MED ORDER — PEG 3350-KCL-NA BICARB-NACL 420 G PO SOLR: 4000.0000 mL | ORAL | 0 refills | Status: DC

## 2022-06-20 NOTE — Telephone Encounter (Signed)
Referring MD/PCP: Gerarda Fraction  Procedure: Tcs  Reason/Indication:  hx of colon polyps  Has patient had this procedure before?  Yes   If so, when, by whom and where?  3 years ago  Is there a family history of colon cancer?  no  Who?  What age when diagnosed?    Is patient diabetic? If yes, Type 1 or Type 2   yes, type 2      Does patient have prosthetic heart valve or mechanical valve?  No   Do you have a pacemaker/defibrillator?  no  Has patient ever had endocarditis/atrial fibrillation? no  Does patient use oxygen? no  Has patient had joint replacement within last 12 months?  no  Is patient constipated or do they take laxatives? no  Does patient have a history of alcohol/drug use?  no  Have you had a stroke/heart attack last 6 mths? no  Do you take medicine for weight loss?  no  For male patients,: have you had a hysterectomy n/a                      are you post menopausal n/a                      do you still have your menstrual cycle n/a  Is patient on blood thinner such as Coumadin, Plavix and/or Aspirin? No   Medications: atorvastatin 20 mg daily, fish oil 2000 mg daily, flax see oil 2400 mg daily, cinnamon 200 mg daily, MVI daily, Tumeric 500 mg daily, calcium, glucosamine daily   Allergies: nkda   Medication Adjustment per Dr Jenetta Downer none   Procedure date & time: 07/21/22 at 945 am

## 2022-06-20 NOTE — Telephone Encounter (Signed)
Marjoria Mancillas Ann Williette Loewe, CMA  ?

## 2022-07-18 ENCOUNTER — Encounter (HOSPITAL_COMMUNITY)
Admission: RE | Admit: 2022-07-18 | Discharge: 2022-07-18 | Disposition: A | Discharge status: Home / Self Care | Payer: No Typology Code available for payment source | Source: Ambulatory Visit | Attending: Gastroenterology | Admitting: Gastroenterology

## 2022-07-21 ENCOUNTER — Ambulatory Visit (HOSPITAL_COMMUNITY): Payer: No Typology Code available for payment source | Admitting: Anesthesiology

## 2022-07-21 ENCOUNTER — Encounter (HOSPITAL_COMMUNITY): Payer: Self-pay | Admitting: Gastroenterology

## 2022-07-21 ENCOUNTER — Ambulatory Visit (HOSPITAL_BASED_OUTPATIENT_CLINIC_OR_DEPARTMENT_OTHER): Payer: No Typology Code available for payment source | Admitting: Anesthesiology

## 2022-07-21 ENCOUNTER — Ambulatory Visit (HOSPITAL_COMMUNITY)
Admission: RE | Admit: 2022-07-21 | Discharge: 2022-07-21 | Disposition: A | Discharge status: Home / Self Care | Payer: No Typology Code available for payment source | Attending: Gastroenterology | Admitting: Gastroenterology

## 2022-07-21 ENCOUNTER — Encounter (HOSPITAL_COMMUNITY)
Admission: RE | Disposition: A | Discharge status: Home / Self Care | Payer: Self-pay | Source: Home / Self Care | Attending: Gastroenterology

## 2022-07-21 DIAGNOSIS — K219 Gastro-esophageal reflux disease without esophagitis: Secondary | ICD-10-CM | POA: Diagnosis not present

## 2022-07-21 DIAGNOSIS — D123 Benign neoplasm of transverse colon: Secondary | ICD-10-CM | POA: Insufficient documentation

## 2022-07-21 DIAGNOSIS — D124 Benign neoplasm of descending colon: Secondary | ICD-10-CM | POA: Insufficient documentation

## 2022-07-21 DIAGNOSIS — E119 Type 2 diabetes mellitus without complications: Secondary | ICD-10-CM | POA: Diagnosis not present

## 2022-07-21 DIAGNOSIS — K635 Polyp of colon: Secondary | ICD-10-CM

## 2022-07-21 DIAGNOSIS — Z09 Encounter for follow-up examination after completed treatment for conditions other than malignant neoplasm: Secondary | ICD-10-CM | POA: Diagnosis present

## 2022-07-21 DIAGNOSIS — G709 Myoneural disorder, unspecified: Secondary | ICD-10-CM | POA: Insufficient documentation

## 2022-07-21 DIAGNOSIS — Z86010 Personal history of colonic polyps: Secondary | ICD-10-CM

## 2022-07-21 DIAGNOSIS — D120 Benign neoplasm of cecum: Secondary | ICD-10-CM | POA: Insufficient documentation

## 2022-07-21 DIAGNOSIS — D122 Benign neoplasm of ascending colon: Secondary | ICD-10-CM | POA: Diagnosis not present

## 2022-07-21 DIAGNOSIS — Z87891 Personal history of nicotine dependence: Secondary | ICD-10-CM | POA: Diagnosis not present

## 2022-07-21 DIAGNOSIS — M1990 Unspecified osteoarthritis, unspecified site: Secondary | ICD-10-CM | POA: Diagnosis not present

## 2022-07-21 HISTORY — PX: HEMOSTASIS CLIP PLACEMENT: SHX6857

## 2022-07-21 HISTORY — PX: POLYPECTOMY: SHX149

## 2022-07-21 HISTORY — PX: COLONOSCOPY WITH PROPOFOL: SHX5780

## 2022-07-21 LAB — HM COLONOSCOPY

## 2022-07-21 SURGERY — COLONOSCOPY WITH PROPOFOL
Anesthesia: General

## 2022-07-21 MED ORDER — PROPOFOL 1000 MG/100ML IV EMUL
INTRAVENOUS | Status: AC
  Filled 2022-07-21: qty 300

## 2022-07-21 MED ORDER — PROPOFOL 500 MG/50ML IV EMUL
INTRAVENOUS | Status: AC
  Filled 2022-07-21: qty 150

## 2022-07-21 MED ORDER — LACTATED RINGERS IV SOLN
INTRAVENOUS | Status: DC
  Administered 2022-07-21: 1000 mL via INTRAVENOUS

## 2022-07-21 MED ORDER — PROPOFOL 500 MG/50ML IV EMUL
INTRAVENOUS | Status: DC | PRN
  Administered 2022-07-21: 150 ug/kg/min via INTRAVENOUS

## 2022-07-21 MED ORDER — LACTATED RINGERS IV SOLN: INTRAVENOUS | Status: DC | PRN

## 2022-07-21 MED ORDER — PROPOFOL 10 MG/ML IV BOLUS
INTRAVENOUS | Status: DC | PRN
  Administered 2022-07-21: 100 mg via INTRAVENOUS

## 2022-07-21 MED ORDER — LIDOCAINE HCL (CARDIAC) PF 100 MG/5ML IV SOSY
PREFILLED_SYRINGE | INTRAVENOUS | Status: DC | PRN
  Administered 2022-07-21: 60 mg via INTRATRACHEAL

## 2022-07-21 NOTE — Transfer of Care (Signed)
Immediate Anesthesia Transfer of Care Note  Patient: PHELAN SCHADT  Procedure(s) Performed: COLONOSCOPY WITH PROPOFOL POLYPECTOMY INTESTINAL HEMOSTASIS CLIP PLACEMENT  Patient Location: Short Stay  Anesthesia Type:General  Level of Consciousness: sedated  Airway & Oxygen Therapy: Patient Spontanous Breathing  Post-op Assessment: Report given to RN and Post -op Vital signs reviewed and stable  Post vital signs: Reviewed and stable  Last Vitals:  Vitals Value Taken Time  BP 90/60   Temp 36   Pulse 80   Resp 16   SpO2 97     Last Pain:  Vitals:   07/21/22 0956  TempSrc:   PainSc: 0-No pain      Patients Stated Pain Goal: 8 (32/99/24 2683)  Complications: No notable events documented.

## 2022-07-21 NOTE — Discharge Instructions (Signed)
You are being discharged to home.  Resume your previous diet.  We are waiting for your pathology results.  Your physician has recommended a repeat colonoscopy for surveillance based on pathology results.  

## 2022-07-21 NOTE — Anesthesia Preprocedure Evaluation (Signed)
Anesthesia Evaluation  Patient identified by MRN, date of birth, ID band Patient awake    Reviewed: Allergy & Precautions, NPO status , Patient's Chart, lab work & pertinent test results  Airway Mallampati: II  TM Distance: >3 FB Neck ROM: Full    Dental  (+) Dental Advisory Given, Teeth Intact   Pulmonary neg pulmonary ROS, former smoker,    Pulmonary exam normal breath sounds clear to auscultation       Cardiovascular negative cardio ROS Normal cardiovascular exam Rhythm:Regular Rate:Normal     Neuro/Psych  Neuromuscular disease negative psych ROS   GI/Hepatic Neg liver ROS, GERD  Medicated,  Endo/Other  diabetes, Well Controlled, Type 2  Renal/GU negative Renal ROS  negative genitourinary   Musculoskeletal  (+) Arthritis , Osteoarthritis,    Abdominal   Peds negative pediatric ROS (+)  Hematology negative hematology ROS (+)   Anesthesia Other Findings   Reproductive/Obstetrics negative OB ROS                             Anesthesia Physical Anesthesia Plan  ASA: 2  Anesthesia Plan: General   Post-op Pain Management: Minimal or no pain anticipated   Induction: Intravenous  PONV Risk Score and Plan: Propofol infusion  Airway Management Planned: Nasal Cannula and Natural Airway  Additional Equipment:   Intra-op Plan:   Post-operative Plan:   Informed Consent: I have reviewed the patients History and Physical, chart, labs and discussed the procedure including the risks, benefits and alternatives for the proposed anesthesia with the patient or authorized representative who has indicated his/her understanding and acceptance.     Dental advisory given  Plan Discussed with: CRNA and Surgeon  Anesthesia Plan Comments:         Anesthesia Quick Evaluation

## 2022-07-21 NOTE — Op Note (Signed)
[facility] Patient Name: Arthur Aguilar Procedure Date: 07/21/2022 9:47 AM MRN: [id number] Date of Birth: 10-15-1961 Attending MD: Maylon Peppers ,  CSN: [id number] Age: 61 Admit Type: Outpatient Procedure:                Colonoscopy Indications:              Surveillance: Personal history of colonic polyps                            (unknown histology) on last colonoscopy 3 years ago Providers:                Maylon Peppers, Crystal Page, Raphael Gibney,                            Technician Referring MD:              Medicines:                Monitored Anesthesia Care Complications:            No immediate complications. Estimated Blood Loss:     Estimated blood loss: none. Procedure:                Pre-Anesthesia Assessment:                           - Prior to the procedure, a History and Physical                            was performed, and patient medications, allergies                            and sensitivities were reviewed. The patient's                            tolerance of previous anesthesia was reviewed.                           - The risks and benefits of the procedure and the                            sedation options and risks were discussed with the                            patient. All questions were answered and informed                            consent was obtained.                           - ASA Grade Assessment: II - A patient with mild                            systemic disease.                           After obtaining informed consent, the colonoscope  was passed under direct vision. Throughout the                            procedure, the patient's blood pressure, pulse, and                            oxygen saturations were monitored continuously. The                            [id number]-HQ190L ([id number]) scope was introduced through                            the anus and advanced to the the cecum, identified                             by appendiceal orifice and ileocecal valve. The                            colonoscopy was performed without difficulty. The                            patient tolerated the procedure well. The quality                            of the bowel preparation was good. Scope In: 10:00:49 AM Scope Out: 10:30:48 AM Scope Withdrawal Time: 0 hours 21 minutes 31 seconds  Total Procedure Duration: 0 hours 29 minutes 59 seconds  Findings:      The perianal and digital rectal examinations were normal. Pertinent       negatives include normal sphincter tone.      Three sessile polyps were found in the transverse colon, ascending colon       and cecum. The polyps were 3 to 6 mm in size. These polyps were removed       with a cold snare. Resection and retrieval were complete.      A 1 mm polyp was found in the transverse colon. The polyp was sessile.       The polyp was removed with a cold biopsy forceps. Resection and       retrieval were complete.      A 4 mm polyp was found in the descending colon. The polyp was sessile.       The polyp was removed with a cold snare. Resection and retrieval were       complete.      The retroflexed view of the distal rectum and anal verge was normal and       showed no anal or rectal abnormalities. Impression:               - Three 3 to 6 mm polyps in the transverse colon,                            in the ascending colon and in the cecum, removed                            with a cold snare. Resected and retrieved.                           -  One 1 mm polyp in the transverse colon, removed                            with a cold biopsy forceps. Resected and retrieved.                           - One 4 mm polyp in the descending colon, removed                            with a cold snare. Resected and retrieved.                           - The distal rectum and anal verge are normal on                            retroflexion view. Moderate  Sedation:      Per Anesthesia Care Recommendation:           - Discharge patient to home (ambulatory).                           - Resume previous diet.                           - Await pathology results.                           - Repeat colonoscopy for surveillance based on                            pathology results. Procedure Code(s):        --- Professional ---                           [phone], Colonoscopy, flexible; with removal of                            tumor(s), polyp(s), or other lesion(s) by snare                            technique                           [id number], 47, Colonoscopy, flexible; with biopsy,                            single or multiple Diagnosis Code(s):        --- Professional ---                           K63.5, Polyp of colon                           Z86.010, Personal history of colonic polyps CPT copyright 2019 American Medical Association. All rights reserved. The codes documented in this report are preliminary and upon coder review may  be revised to meet current compliance  requirements. Maylon Peppers, MD Maylon Peppers,  07/21/2022 10:39:30 AM This report has been signed electronically. Number of Addenda: 0

## 2022-07-21 NOTE — H&P (Signed)
Arthur Aguilar is an 61 y.o. male.   Chief Complaint: history of colon polyps HPI: 61 y/o with past medical history of GERD, hyperlipidemia, arthritis, coming for s history of colon polyps.  Last colonoscopy was performed 3 years ago, patient reports that he had polyps, no reports are available.  The patient denies having any complaints such as melena, hematochezia, abdominal pain or distention, change in her bowel movement consistency or frequency, no changes in weight recently.  No family history of colorectal cancer.   Past Medical History:  Diagnosis Date   Arthritis    GERD (gastroesophageal reflux disease)    History of kidney stones    Hypercholesteremia    Scoliosis     Past Surgical History:  Procedure Laterality Date   CAPSULOTOMY Right 10/09/2018   Procedure: right second  MPJ CAPSULOTOMY;  Surgeon: [name], DPM;  Location[facility];  Service: Podiatry;  Laterality: Right;   CARPAL TUNNEL RELEASE Right 04/02/2015   Procedure: RIGHT CARPAL TUNNEL RELEASE;  Surgeon: [name], MD;  Location: [facility];  Service: Orthopedics;  Laterality: Right;   dental bone implant     bottom - right jaw.   FLEXOR TENOTOMY  Right 10/09/2018   Procedure: FLEXOR TENOTOMY;  Surgeon: [name], DPM;  Location: [facility];  Service: Podiatry;  Laterality: Right;   FOOT ARTHRODESIS Right 10/09/2018   Procedure: ARTHRODESIS 2ND DIGIT RIGHT FOOT;  Surgeon: [name], DPM;  Location: [facility];  Service: Podiatry;  Laterality: Right;   HIP SURGERY     left hip replacement     SHOULDER ARTHROSCOPY WITH SUBACROMIAL DECOMPRESSION, ROTATOR CUFF REPAIR AND BICEP TENDON REPAIR Right 01/31/2018   Procedure: Right Shoulder Arthroscopy, Biceps Tenodesis, Mini Open Rotator Cuff Tear Repair, Possible Superior Capsular Reconstruction;  Surgeon: [name], MD;  Location: [place];  Service: Orthopedics;  Laterality: Right;   SHOULDER OPEN ROTATOR CUFF REPAIR Left 05/30/2017    Procedure: ROTATOR CUFF REPAIR SHOULDER OPEN;  Surgeon: [name], MD;  Location: [facility];  Service: Orthopedics;  Laterality: Left;   TENDON LENGTHENING Right 10/09/2018   Procedure: EXTENSOR TENDON LENGTHENING;  Surgeon: [name], DPM;  Location: [facility];  Service: Podiatry;  Laterality: Right;   TOOTH EXTRACTION  01/04/2018    1 tooth    Family History  Problem Relation Age of Onset   Heart disease Unknown    Arthritis Unknown    Lung disease Unknown    Diabetes Unknown    Social History:  reports that he quit smoking about 13 years ago. His smoking use included cigarettes. He has a 12.50 pack-year smoking history. He has never used smokeless tobacco. He reports current alcohol use of about 14.0 standard drinks of alcohol per week. He reports that he does not use drugs.  Allergies: No Known Allergies  Facility-Administered Medications Prior to Admission  Medication Dose Route Frequency Provider Last Rate Last Admin   ciprofloxacin (CIPRO) tablet 500 mg  500 mg Oral Once Cleon Gustin, MD       Medications Prior to Admission  Medication Sig Dispense Refill   acetaminophen (TYLENOL) 500 MG tablet Take 1,000 mg by mouth every 6 (six) hours as needed for moderate pain.      atorvastatin (LIPITOR) 20 MG tablet Take 20 mg by mouth daily.     Cinnamon 500 MG capsule Take 1,000 mg by mouth daily.     Flaxseed, Linseed, (FLAXSEED OIL) 1200 MG CAPS Take 1,200 mg by mouth 2 (  two) times daily.     Glucosamine HCl (GLUCOSAMINE PO) Take 1,000 mg by mouth daily.      ibuprofen (ADVIL) 200 MG tablet Take 800 mg by mouth every 8 (eight) hours as needed for moderate pain.     Multiple Vitamins-Minerals (MENS 50+ MULTIVITAMIN PO) Take 1 tablet by mouth daily.     Omega-3 Fatty Acids (FISH OIL) 1000 MG CAPS Take 2,000 mg by mouth 2 (two) times daily.     polyethylene glycol-electrolytes (TRILYTE) 420 g solution Take 4,000 mLs by mouth as directed. 4000 mL 0   Turmeric Curcumin  500 MG CAPS Take 500 mg by mouth daily.      No results found for this or any previous visit (from the past 48 hour(s)). No results found.  Review of Systems  All other systems reviewed and are negative.   Blood pressure (!) 153/83, temperature 97.8 F (36.6 C), temperature source Oral, resp. rate 18, SpO2 97 %. Physical Exam  GENERAL: The patient is AO x3, in no acute distress. HEENT: Head is normocephalic and atraumatic. EOMI are intact. Mouth is well hydrated and without lesions. NECK: Supple. No masses LUNGS: Clear to auscultation. No presence of rhonchi/wheezing/rales. Adequate chest expansion HEART: RRR, normal s1 and s2. ABDOMEN: Soft, nontender, no guarding, no peritoneal signs, and nondistended. BS +. No masses. EXTREMITIES: Without any cyanosis, clubbing, rash, lesions or edema. NEUROLOGIC: AOx3, no focal motor deficit. SKIN: no jaundice, no rashes  Assessment/Plan 61 y/o with past medical history of GERD, hyperlipidemia, arthritis, coming for s history of colon polyps.  We will proceed with colonoscopy.   Harvel Quale, MD 07/21/2022, 9:54 AM

## 2022-07-21 NOTE — Anesthesia Postprocedure Evaluation (Signed)
Anesthesia Post Note  Patient: Arthur Aguilar  Procedure(s) Performed: COLONOSCOPY WITH PROPOFOL POLYPECTOMY INTESTINAL HEMOSTASIS CLIP PLACEMENT  Patient location during evaluation: Phase II Anesthesia Type: General Level of consciousness: awake and alert and oriented Pain management: pain level controlled Vital Signs Assessment: post-procedure vital signs reviewed and stable Respiratory status: spontaneous breathing, nonlabored ventilation and respiratory function stable Cardiovascular status: blood pressure returned to baseline and stable Postop Assessment: no apparent nausea or vomiting Anesthetic complications: no   No notable events documented.   Last Vitals:  Vitals:   07/21/22 1035 07/21/22 1037  BP:  102/61  Pulse: 79   Resp: 20   Temp: 36.6 C   SpO2:      Last Pain:  Vitals:   07/21/22 1035  TempSrc: Oral  PainSc:                  Kensly Bowmer C Christyna Letendre

## 2022-07-24 ENCOUNTER — Encounter (INDEPENDENT_AMBULATORY_CARE_PROVIDER_SITE_OTHER): Payer: Self-pay | Admitting: *Deleted

## 2022-07-24 LAB — SURGICAL PATHOLOGY

## 2022-07-27 ENCOUNTER — Encounter (HOSPITAL_COMMUNITY): Payer: Self-pay | Admitting: Gastroenterology

## 2022-10-02 ENCOUNTER — Other Ambulatory Visit: Payer: Self-pay

## 2022-10-02 ENCOUNTER — Ambulatory Visit (INDEPENDENT_AMBULATORY_CARE_PROVIDER_SITE_OTHER): Payer: No Typology Code available for payment source | Admitting: Orthopedic Surgery

## 2022-10-02 ENCOUNTER — Encounter (HOSPITAL_COMMUNITY): Payer: Self-pay

## 2022-10-02 DIAGNOSIS — Z23 Encounter for immunization: Secondary | ICD-10-CM | POA: Insufficient documentation

## 2022-10-02 DIAGNOSIS — W268XXA Contact with other sharp object(s), not elsewhere classified, initial encounter: Secondary | ICD-10-CM | POA: Diagnosis not present

## 2022-10-02 DIAGNOSIS — Y9389 Activity, other specified: Secondary | ICD-10-CM | POA: Insufficient documentation

## 2022-10-02 DIAGNOSIS — S61412A Laceration without foreign body of left hand, initial encounter: Secondary | ICD-10-CM | POA: Diagnosis not present

## 2022-10-02 DIAGNOSIS — S6992XA Unspecified injury of left wrist, hand and finger(s), initial encounter: Secondary | ICD-10-CM | POA: Diagnosis present

## 2022-10-02 DIAGNOSIS — M65331 Trigger finger, right middle finger: Secondary | ICD-10-CM | POA: Diagnosis not present

## 2022-10-02 MED ORDER — METHYLPREDNISOLONE ACETATE 40 MG/ML IJ SUSP
40.0000 mg | Freq: Once | INTRAMUSCULAR | Status: AC
  Administered 2022-10-02: 40 mg

## 2022-10-02 NOTE — Patient Instructions (Signed)

## 2022-10-02 NOTE — Addendum Note (Signed)
Addended by: Moreen Fowler R on: 10/02/2022 02:32 PM   Modules accepted: Orders

## 2022-10-02 NOTE — ED Triage Notes (Signed)
Pt states he was changing a light on his stove when he caught his hand over the metal overhang. Laceration to top of left hand, bleeding controlled at this time

## 2022-10-02 NOTE — Progress Notes (Signed)
FOLLOW UP   Encounter Diagnosis  Name Primary?   Trigger middle finger of right hand Yes     Chief Complaint  Patient presents with   Hand Pain    Right- middle finger triggering again    Yoshi opted for repeat injection   Trigger finger injection  Diagnosis right longer middle finger Procedure injection A1 pulley Medications lidocaine 1% 1 mL and Depo-Medrol 40 mg 1 mL Skin prep alcohol and ethyl chloride Verbal consent was obtained Timeout confirmed the injection site  After cleaning the skin with alcohol and anesthetizing the skin with ethyl chloride the A1 pulley was palpated and the injection was performed without complication

## 2022-10-03 ENCOUNTER — Emergency Department (HOSPITAL_COMMUNITY)
Admission: EM | Admit: 2022-10-03 | Discharge: 2022-10-03 | Disposition: A | Discharge status: Home / Self Care | Payer: PRIVATE HEALTH INSURANCE | Attending: Emergency Medicine | Admitting: Emergency Medicine

## 2022-10-03 DIAGNOSIS — S61412A Laceration without foreign body of left hand, initial encounter: Secondary | ICD-10-CM | POA: Diagnosis not present

## 2022-10-03 MED ORDER — LIDOCAINE HCL (PF) 1 % IJ SOLN
5.0000 mL | Freq: Once | INTRAMUSCULAR | Status: AC
  Administered 2022-10-03: 5 mL via INTRADERMAL
  Filled 2022-10-03: qty 5

## 2022-10-03 MED ORDER — TETANUS-DIPHTH-ACELL PERTUSSIS 5-2.5-18.5 LF-MCG/0.5 IM SUSY
0.5000 mL | PREFILLED_SYRINGE | Freq: Once | INTRAMUSCULAR | Status: AC
  Administered 2022-10-03: 0.5 mL via INTRAMUSCULAR
  Filled 2022-10-03: qty 0.5

## 2022-10-03 NOTE — Discharge Instructions (Signed)
Local wound care with bacitracin and dressing changes twice daily.  Keep the wound clean and dry.  It is okay to shower, but no swimming in pools or hot tubs.  Sutures are to be removed in 7 to 10 days.  Please follow-up with your primary doctor for this.  Return to the emergency department if you develop redness surrounding the wound, pus draining from the wound, red streaks up or down the arm, increased pain, or for other new and concerning symptoms.

## 2022-10-03 NOTE — ED Provider Notes (Signed)
[facility] EMERGENCY DEPARTMENT Provider Note   CSN: [id number] Arrival date & time: 10/02/22  1936     History  Chief Complaint  Patient presents with   Laceration    TYLIN FORCE is a 61 y.o. male.  Patient is a 61 year old male with past medical history of hyperlipidemia.  Patient presenting today for evaluation of a left hand injury.  He reports working on the hood of a stove top when he sliced the dorsal aspect of his left hand on a piece of exposed metal.  Bleeding controlled with direct pressure.  Last tetanus shot unknown.  The history is provided by the patient.       Home Medications Prior to Admission medications   Medication Sig Start Date End Date Taking? Authorizing Provider  acetaminophen (TYLENOL) 500 MG tablet Take 1,000 mg by mouth every 6 (six) hours as needed for moderate pain.     [provider]  atorvastatin (LIPITOR) 20 MG tablet Take 20 mg by mouth daily. 04/19/21   [provider]  Cinnamon 500 MG capsule Take 1,000 mg by mouth daily. Patient not taking: Reported on 10/02/2022    [provider]  Flaxseed, Linseed, (FLAXSEED OIL) 1200 MG CAPS Take 1,200 mg by mouth 2 (two) times daily.    [provider]  Glucosamine HCl (GLUCOSAMINE PO) Take 1,000 mg by mouth daily.     [provider]  ibuprofen (ADVIL) 200 MG tablet Take 800 mg by mouth every 8 (eight) hours as needed for moderate pain.    [provider]  Multiple Vitamins-Minerals (MENS 50+ MULTIVITAMIN PO) Take 1 tablet by mouth daily.    [provider]  Omega-3 Fatty Acids (FISH OIL) 1000 MG CAPS Take 2,000 mg by mouth 2 (two) times daily.    [provider]  Turmeric Curcumin 500 MG CAPS Take 500 mg by mouth daily.    [provider]      Allergies    Patient has no known allergies.    Review of Systems   Review of Systems  All other systems reviewed and are negative.   Physical Exam Updated Vital  Signs BP (!) 149/81 (BP Location: Right Arm)   Pulse 73   Temp 98.2 F (36.8 C) (Oral)   Resp 20   Ht '5\' 10"'$  (1.778 m)   Wt 106.6 kg   SpO2 95%   BMI 33.72 kg/m  Physical Exam Vitals and nursing note reviewed.  Constitutional:      General: He is not in acute distress.    Appearance: Normal appearance.  HENT:     Head: Normocephalic and atraumatic.  Pulmonary:     Effort: Pulmonary effort is normal.  Musculoskeletal:     Comments: There is a 3.5 cm V shaped laceration to the dorsum of the left hand overlying the mid second metacarpal.  There is no tendon involvement.  Skin:    General: Skin is warm and dry.  Neurological:     Mental Status: He is alert and oriented to person, place, and time.     ED Results / Procedures / Treatments   Labs (all labs ordered are listed, but only abnormal results are displayed) Labs Reviewed - No data to display  EKG None  Radiology No results found.  Procedures Procedures    Medications Ordered in ED Medications  lidocaine (PF) (XYLOCAINE) 1 % injection 5 mL (has no administration in time range)  Tdap (BOOSTRIX) injection 0.5 mL (0.5 mLs  Intramuscular Given 10/03/22 0026)    ED Course/ Medical Decision Making/ A&P  Patient presenting with a left hand laceration with no tendon involvement.  Laceration was repaired as below.  Tetanus updated.  Patient to follow-up with primary doctor in 7 to 10 days for suture removal, sooner as needed.  LACERATION REPAIR Performed by: Veryl Speak Authorized by: Veryl Speak Consent: Verbal consent obtained. Risks and benefits: risks, benefits and alternatives were discussed Consent given by: patient Patient identity confirmed: provided demographic data Prepped and Draped in normal sterile fashion Wound explored  Laceration Location: Left hand  Laceration Length: 3.5 cm  No Foreign Bodies seen or palpated  Anesthesia: local infiltration  Local anesthetic: lidocaine 1% without  epinephrine  Anesthetic total: 3 ml  Irrigation method: syringe Amount of cleaning: standard  Skin closure: 4-0 Ethilon  Number of sutures: 9  Technique: Simple interrupted  Patient tolerance: Patient tolerated the procedure well with no immediate complications.   Final Clinical Impression(s) / ED Diagnoses Final diagnoses:  None    Rx / DC Orders ED Discharge Orders     None         Veryl Speak, MD 10/03/22 [phone]

## 2022-10-05 ENCOUNTER — Ambulatory Visit: Payer: No Typology Code available for payment source | Admitting: Orthopedic Surgery

## 2023-02-01 ENCOUNTER — Encounter: Payer: Self-pay | Admitting: Radiology

## 2023-09-03 ENCOUNTER — Telehealth: Payer: Self-pay | Admitting: Orthopedic Surgery

## 2023-09-03 NOTE — Telephone Encounter (Signed)
Returned the patient's call regarding scheduling an injection.  LVM for him to call me back.

## 2023-09-07 ENCOUNTER — Ambulatory Visit (INDEPENDENT_AMBULATORY_CARE_PROVIDER_SITE_OTHER): Payer: No Typology Code available for payment source | Admitting: Orthopedic Surgery

## 2023-09-07 DIAGNOSIS — M65342 Trigger finger, left ring finger: Secondary | ICD-10-CM

## 2023-09-07 DIAGNOSIS — M65331 Trigger finger, right middle finger: Secondary | ICD-10-CM

## 2023-09-07 MED ORDER — METHYLPREDNISOLONE ACETATE 40 MG/ML IJ SUSP
40.0000 mg | Freq: Once | INTRAMUSCULAR | Status: AC
Start: 2023-09-07 — End: 2023-09-07
  Administered 2023-09-07: 40 mg via INTRA_ARTICULAR

## 2023-09-07 MED ORDER — METHYLPREDNISOLONE ACETATE 40 MG/ML IJ SUSP
40.0000 mg | Freq: Once | INTRAMUSCULAR | Status: AC
  Administered 2023-09-07: 40 mg via INTRA_ARTICULAR

## 2023-09-07 NOTE — Progress Notes (Signed)
Chief Complaint  Patient presents with   Hand Pain    Injections bil hands the right hand worse than the left  the left ring finger is locking now aswell   C/o trigger finger  RLF LLF  Requests injection   Trigger finger injection  Right long finger (middle) Procedure injection A1 pulley Medications lidocaine 1% 1 mL and Depo-Medrol 40 mg 1 mL Skin prep alcohol and ethyl chloride Verbal consent was obtained Timeout confirmed the injection site  After cleaning the skin with alcohol and anesthetizing the skin with ethyl chloride the A1 pulley was palpated and the injection was performed without complication    Trigger finger injection  Left ring finger  Procedure injection A1 pulley Medications lidocaine 1% 1 mL and Depo-Medrol 40 mg 1 mL Skin prep alcohol and ethyl chloride Verbal consent was obtained Timeout confirmed the injection site  After cleaning the skin with alcohol and anesthetizing the skin with ethyl chloride the A1 pulley was palpated and the injection was performed without complication

## 2024-01-28 ENCOUNTER — Ambulatory Visit (INDEPENDENT_AMBULATORY_CARE_PROVIDER_SITE_OTHER): Payer: No Typology Code available for payment source | Admitting: Orthopedic Surgery

## 2024-01-28 DIAGNOSIS — M65332 Trigger finger, left middle finger: Secondary | ICD-10-CM | POA: Diagnosis not present

## 2024-01-28 DIAGNOSIS — M65341 Trigger finger, right ring finger: Secondary | ICD-10-CM

## 2024-01-28 MED ORDER — METHYLPREDNISOLONE ACETATE 40 MG/ML IJ SUSP
40.0000 mg | Freq: Once | INTRAMUSCULAR | Status: AC
Start: 2024-01-28 — End: 2024-01-28
  Administered 2024-01-28: 40 mg via INTRA_ARTICULAR

## 2024-01-28 NOTE — Progress Notes (Signed)
   There were no vitals taken for this visit.  There is no height or weight on file to calculate BMI.  Chief Complaint  Patient presents with   Injections    Patient in for injections bil hands  the right hand tingles/locks on the trigger finger  the left the trigger finger locks     Encounter Diagnoses  Name Primary?   Trigger finger, right ring finger Yes   Trigger finger, left middle finger     DOI/DOS/ Date: 5 years  Unchanged  Patient comes in for injections right ring finger left long finger for trigger phenomena  Trigger finger injection  Diagnosis   Encounter Diagnoses  Name Primary?   Trigger finger, right ring finger Yes   Trigger finger, left middle finger    TRIGGER FINGER INJECTION Procedure injection A1 pulley Medications lidocaine 1% 1 mL and Depo-Medrol 40 mg 1 mL Skin prep alcohol and ethyl chloride Verbal consent was obtained Timeout confirmed the injection site  After cleaning the skin with alcohol and anesthetizing the skin with ethyl chloride the A1 pulley was palpated and the injection was performed without complication.  Trigger finger injection  Procedure injection A1 pulley Medications lidocaine 1% 1 mL and Depo-Medrol 40 mg 1 mL Skin prep alcohol and ethyl chloride Verbal consent was obtained Timeout confirmed the injection site  After cleaning the skin with alcohol and anesthetizing the skin with ethyl chloride the A1 pulley was palpated and the injection was performed without complication

## 2024-01-28 NOTE — Addendum Note (Signed)
 Addended by: Michaele Offer on: 01/28/2024 01:26 PM   Modules accepted: Orders

## 2024-01-28 NOTE — Progress Notes (Signed)
   There were no vitals taken for this visit.  There is no height or weight on file to calculate BMI.  Chief Complaint  Patient presents with   Injections    Patient in for injections bil hands  the right hand tingles/locks on the trigger finger  the left the trigger finger locks     No diagnosis found.  DOI/DOS/ Date: 5 years  Unchanged

## 2024-05-15 ENCOUNTER — Telehealth: Payer: Self-pay

## 2024-05-15 ENCOUNTER — Other Ambulatory Visit: Payer: Self-pay | Admitting: Orthopedic Surgery

## 2024-05-15 MED ORDER — NAPROXEN 500 MG PO TABS: 500.0000 mg | ORAL_TABLET | Freq: Two times a day (BID) | ORAL | 2 refills | Status: DC

## 2024-05-15 NOTE — Telephone Encounter (Signed)
 Naproxen    PATIENT USES [facility]    Patient called stating that you had given him Naproxen  in 2012 for his Achilles, It has been a very long time since you seen him for his Achilles, but he says it is swollen and hurts. He is asking if you could do the prescription without him coming in the office.

## 2024-07-18 ENCOUNTER — Ambulatory Visit (INDEPENDENT_AMBULATORY_CARE_PROVIDER_SITE_OTHER): Admitting: Orthopedic Surgery

## 2024-07-18 ENCOUNTER — Encounter: Payer: Self-pay | Admitting: Orthopedic Surgery

## 2024-07-18 DIAGNOSIS — M65342 Trigger finger, left ring finger: Secondary | ICD-10-CM

## 2024-07-18 DIAGNOSIS — M65331 Trigger finger, right middle finger: Secondary | ICD-10-CM | POA: Diagnosis not present

## 2024-07-18 MED ORDER — METHYLPREDNISOLONE ACETATE 40 MG/ML IJ SUSP
40.0000 mg | Freq: Once | INTRAMUSCULAR | Status: AC
Start: 2024-07-18 — End: 2024-07-18
  Administered 2024-07-18: 40 mg via INTRA_ARTICULAR

## 2024-07-18 NOTE — Progress Notes (Signed)
   There were no vitals taken for this visit.  There is no height or weight on file to calculate BMI.  Chief Complaint  Patient presents with   Hand Problem    Both     No diagnosis found.  DOI/DOS/ Date: ongoing   Worse  Right hand numbness fingers (states CTS) Right middle finger triggering  Left middle and Left ring fingers trigger  Injections help some

## 2024-07-18 NOTE — Progress Notes (Signed)
   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  He has tingling and some numbness in the right hand all digits  At this point he is not ready to have surgery until the first of the year in terms of the carpal tunnel syndrome  We decided to inject his right long finger and left ring finger  Trigger finger injection  Diagnosis right long finger Procedure injection A1 pulley Medications lidocaine 1% 1 mL and Depo-Medrol 40 mg 1 mL Skin prep alcohol and ethyl chloride Verbal consent was obtained Timeout confirmed the injection site  After cleaning the skin with alcohol and anesthetizing the skin with ethyl chloride the A1 pulley was palpated and the injection was performed without complication   Trigger finger injection  Diagnosis left ring finger Procedure injection A1 pulley Medications lidocaine 1% 1 mL and Depo-Medrol 40 mg 1 mL Skin prep alcohol and ethyl chloride Verbal consent was obtained Timeout confirmed the injection site  After cleaning the skin with alcohol and anesthetizing the skin with ethyl chloride the A1 pulley was palpated and the injection was performed without complication  Return as needed

## 2024-08-31 ENCOUNTER — Other Ambulatory Visit: Payer: Self-pay | Admitting: Orthopedic Surgery

## 2024-09-08 ENCOUNTER — Other Ambulatory Visit: Payer: Self-pay | Admitting: Physician Assistant

## 2024-09-08 DIAGNOSIS — R3912 Poor urinary stream: Secondary | ICD-10-CM

## 2024-09-08 NOTE — Telephone Encounter (Unsigned)
 Copied from CRM #[id number]. Topic: Clinical - Medication Refill >> Sep 08, 2024  9:24 AM Arthur Aguilar wrote: Medication: atorvastatin 20 mg  Has the patient contacted their pharmacy? Yes (Agent: If no, request that the patient contact the pharmacy for the refill. If patient does not wish to contact the pharmacy document the reason why and proceed with request.) (Agent: If yes, when and what did the pharmacy advise?)  This is the patient's preferred pharmacy:  [id number] [place], [place] - [address] AT [facility] [address] [place] [address] Phone: [phone] Fax: [phone]  Is this the correct pharmacy for this prescription? Yes If no, delete pharmacy and type the correct one.   Has the prescription been filled recently? No  Is the patient out of the medication? No  Has the patient been seen for an appointment in the last year OR does the patient have an upcoming appointment? Yes  Can we respond through MyChart? Yes  Agent: Please be advised that Rx refills may take up to 3 business days. We ask that you follow-up with your pharmacy.

## 2024-09-18 ENCOUNTER — Encounter: Payer: Self-pay | Admitting: Orthopedic Surgery

## 2024-09-18 ENCOUNTER — Ambulatory Visit (INDEPENDENT_AMBULATORY_CARE_PROVIDER_SITE_OTHER): Admitting: Orthopedic Surgery

## 2024-09-18 VITALS — BP 169/117 | HR 72 | Ht 70.0 in | Wt 235.0 lb

## 2024-09-18 DIAGNOSIS — M7661 Achilles tendinitis, right leg: Secondary | ICD-10-CM | POA: Diagnosis not present

## 2024-09-18 NOTE — Progress Notes (Signed)
  Intake history:  Chief Complaint  Patient presents with   Foot Pain    Right /achilles area    BP (!) 169/117   Pulse 72   Ht 5' 10 (1.778 m)   Wt 235 lb (106.6 kg)   BMI 33.72 kg/m  Body mass index is 33.72 kg/m.  Chief Complaint  Patient presents with   Foot Pain    Right /achilles area    Hpi:  63 year old male presents with 60-month history of right heel pain over his Achilles.  He has intermittently tried to wear a cam walker, he has taken naproxen , ibuprofen , Tylenol  and used ice on his heel with some relief but not complete relief  No evidence of numbness tingling or plantar pain pain is all in the posterior part of the Achilles associated with some swelling  No history of trauma  Right heel exam shows swelling and tenderness along the actual Achilles tendon itself there is no nodularity or defects normal Thompson test normal strength and pushoff ankle stability normal  Neurovascular exam intact  Plantar aspect of the heel nontender  Encounter Diagnosis  Name Primary?   Right Achilles tendinitis Yes    Plan patient encouraged to wear the cam walker for at least 6 weeks Continue ice and anti-inflammatories    [facility]  WHAT ARE WE SEEING YOU FOR TODAY?   Right foot 2 mo   How long has this bothered you? (DOI?DOS?WS?)  2 months  Was there an injury? No  Anticoag.  No  Diabetes Yes  Heart disease No  Hypertension No  SMOKING HX No  Kidney disease No  Any ALLERGIES ____________No Known Allergies __________________________________   Treatment:  Have you taken:  Tylenol  Yes  Advil  Yes  Had PT No  Had injection No  Other  _______________tried Terrance __________

## 2024-09-18 NOTE — Patient Instructions (Signed)
 Wear the boot for 6 weeks to rest your tendon

## 2024-09-18 NOTE — Progress Notes (Signed)
  Intake history:  Chief Complaint  Patient presents with   Foot Pain    Right /achilles area     BP (!) 169/117   Pulse 72   Ht 5' 10 (1.778 m)   Wt 235 lb (106.6 kg)   BMI 33.72 kg/m  Body mass index is 33.72 kg/m.  [facility]  WHAT ARE WE SEEING YOU FOR TODAY?   Right foot 2 mo   How long has this bothered you? (DOI?DOS?WS?)  2 months  Was there an injury? No  Anticoag.  No  Diabetes Yes  Heart disease No  Hypertension No  SMOKING HX No  Kidney disease No  Any ALLERGIES ____________No Known Allergies __________________________________   Treatment:  Have you taken:  Tylenol  Yes  Advil  Yes  Had PT No  Had injection No  Other  _______________tried Terrance __________

## 2024-10-16 ENCOUNTER — Telehealth: Payer: Self-pay

## 2024-10-16 NOTE — Telephone Encounter (Signed)
 Nurse called and spoke with pt to talk about medication refill and NP appt: pt stating insurance would not cover urgent care visit & pt would be out of medication this coming up Tuesday: nurse offered to schedule pt NP appt at a different location that is further away from home in order to get pt in to be seen and have medication refilled: pt agreed to make closer NP appt however pt did not want to cancel NP in 12/2024 until after he spoke with wife or someone to ensure he has transportation to NP about on 10/17/2024 due to pt does not drive.  Nurse agreed not to cancel appt however instructed pt once pt had everything lined up for one of the appts that it would be the patient's responsibility to call and cancel one of the appts: pt agreed.

## 2024-10-16 NOTE — Telephone Encounter (Signed)
 This RN told pt to either go to an urgent care or the [facility] bus [facility] to refill his prescriptions until his Jan appt. Pt stated frustration with this. Pt states if he can't get it refilled at urgent care he is going to stop taking the medications. This RN advised pt not to do that and to call back if he has any issues with getting his medications refilled.   Copied from CRM [id number]. Topic: Clinical - Prescription Issue >> Oct 16, 2024  7:43 AM Arthur Aguilar wrote: Reason for CRM: pt can't get atorvastatin refilled because [name] stated she cannot refills medication that have not established care. Pt can't get in to an appt until January he wants to know what he should do about his medication.

## 2024-10-16 NOTE — Telephone Encounter (Signed)
 Patient called back stating that he went to urgent care and they do not accept his insurance. Patient stated that he was told to callback if he can't get the refill.

## 2024-10-17 ENCOUNTER — Ambulatory Visit (INDEPENDENT_AMBULATORY_CARE_PROVIDER_SITE_OTHER): Admitting: Medical

## 2024-10-17 VITALS — BP 122/80 | HR 83 | Wt 230.0 lb

## 2024-10-17 DIAGNOSIS — M1990 Unspecified osteoarthritis, unspecified site: Secondary | ICD-10-CM

## 2024-10-17 DIAGNOSIS — D692 Other nonthrombocytopenic purpura: Secondary | ICD-10-CM

## 2024-10-17 DIAGNOSIS — E1165 Type 2 diabetes mellitus with hyperglycemia: Secondary | ICD-10-CM

## 2024-10-17 DIAGNOSIS — E785 Hyperlipidemia, unspecified: Secondary | ICD-10-CM

## 2024-10-17 DIAGNOSIS — M7661 Achilles tendinitis, right leg: Secondary | ICD-10-CM

## 2024-10-17 DIAGNOSIS — Z87891 Personal history of nicotine dependence: Secondary | ICD-10-CM

## 2024-10-17 DIAGNOSIS — G5601 Carpal tunnel syndrome, right upper limb: Secondary | ICD-10-CM | POA: Diagnosis not present

## 2024-10-17 MED ORDER — ATORVASTATIN CALCIUM 20 MG PO TABS: 20.0000 mg | ORAL_TABLET | Freq: Every day | ORAL | 1 refills | Status: DC

## 2024-10-17 NOTE — Progress Notes (Signed)
 Subjective:  Arthur Aguilar is a 63 y.o. male who presents for Chief Complaint  Patient presents with   Medical Management of Chronic Issues    New pt, get established- however has an appt in January at [facility] but will run out of medications and needs refill. Would like A1c checked     Arthur Aguilar is a 63 year old male who presents for medication refills and follow-up care.  He is here primarily for medication refills, specifically for his cholesterol medication, atorvastatin 20 mg daily. His current supply will run out by next Wednesday. He has been taking it regularly and did not take it this morning as he was fasting for potential lab work.  He has a history of hyperlipidemia and type 2 diabetes. He was previously prescribed metformin for diabetes but discontinued it due to stomach cramps. He managed his diabetes through weight loss and dietary changes, reducing his weight from 245-250 lbs to 230-235 lbs. He has not been on diabetes medication for years and does not currently monitor his blood sugar regularly. His last A1c test was over six months ago.  He has a history of kidney stones, which he has passed without significant issues, although he recalls having one lodged once or twice in the past.  His medication regimen includes over-the-counter supplements such as fish oil, flaxseed oil, turmeric, glucosamine, and a daily vitamin. He also takes apple cider vinegar with orange juice in the morning to aid digestion and maintain regular bowel movements. In terms of diet, he eats a lot of vegetables, turkey sausage, and cereal with banana. He avoids junk food and has quit smoking since his left hip surgery at age 30.  His family history includes a grandmother who lived to 34-95 years and a mother who is currently 29. His father passed away at 50 due to smoking-related issues. 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 His wife is concerned about his memory and mood changes, which he attributes to aging and job stress.  No other aggravating or relieving factors.    No other c/o.  Past Medical History:  Diagnosis Date   Arthritis    GERD (gastroesophageal reflux disease)    History of kidney stones    Hypercholesteremia    Scoliosis    Current Outpatient Medications on File Prior to Visit  Medication Sig Dispense Refill   acetaminophen  (TYLENOL ) 500 MG tablet Take 1,000 mg by mouth every 6 (six) hours as needed for moderate pain.      Flaxseed, Linseed, (FLAXSEED OIL) 1200 MG CAPS Take 1,200 mg by mouth 2 (two) times daily.     Glucosamine HCl (GLUCOSAMINE PO) Take 1,000 mg by mouth daily.      ibuprofen  (ADVIL ) 200 MG tablet Take 800 mg by mouth every 8 (eight) hours as needed for moderate pain.     Multiple Vitamins-Minerals (MENS 50+ MULTIVITAMIN PO) Take 1 tablet by mouth daily.     naproxen  (NAPROSYN ) 500 MG tablet TAKE 1 TABLET BY MOUTH 2 TIMES DAILY WITH A MEAL. 60 tablet 2   Omega-3 Fatty Acids (FISH OIL) 1000 MG CAPS Take 2,000 mg by mouth 2 (two) times daily.     Turmeric Curcumin 500 MG CAPS Take 500 mg by mouth daily.     Current Facility-Administered Medications on File Prior to Visit  Medication Dose Route Frequency Provider Last Rate Last Admin   ciprofloxacin  (  CIPRO ) tablet 500 mg  500 mg Oral Once McKenzie, Belvie CROME, MD         The following portions of the patient's history were reviewed and updated as appropriate: allergies, current medications, past family history, past medical history, past social history, past surgical history and problem list.  ROS Otherwise as in subjective above    Objective: BP 122/80   Pulse 83   Wt 230 lb (104.3 kg) Comment: unable to get weight due to being in a boot  SpO2 97%   BMI 33.00 kg/m   Wt Readings from Last 3 Encounters:  10/17/24 230 lb (104.3 kg)  09/18/24 235 lb (106.6 kg)  10/02/22 235 lb (106.6  kg)    General appearance: alert, no distress, well developed, well nourished Neck: supple, no lymphadenopathy, no thyromegaly, no masses Heart: RRR, normal S1, S2, no murmurs Lungs: CTA bilaterally, no wheezes, rhonchi, or rales Pulses: 2+ radial pulses, 2+ pedal pulses, normal cap refill Ext: no edema Boot/orthotic on right foot Thinner skin of bilat forearms, patches of purplish red bruising of both forearms   Assessment: Encounter Diagnoses  Name Primary?   Hyperlipidemia, unspecified hyperlipidemia type Yes   Type 2 diabetes mellitus with hyperglycemia, without long-term current use of insulin (HCC)    Carpal tunnel syndrome of right wrist    Osteoarthritis, unspecified osteoarthritis type, unspecified site    Right Achilles tendinitis    Former smoker    Senile purpura      Plan: Hyperlipidemia Managed with atorvastatin 20 mg daily. Running low on medication. - Ordered lipid panel. - Prescribed atorvastatin 20 mg daily.  Type 2 Diabetes Mellitus, history of History of intolerance to metformin. Last A1c over a year ago. Not on medication. No hyperglycemia symptoms. - Ordered A1c test. - Monitor blood glucose levels if A1c is over 7. - Discussed potential symptoms of hyperglycemia and importance of maintaining blood glucose levels.  Osteoarthritis, CTS, right achilles tendonitis - follow up with orthopedics as planned  Former smoker status  Senile purpura noted - advised daily moisturizing lotion   Taris was seen today for medical management of chronic issues.  Diagnoses and all orders for this visit:  Hyperlipidemia, unspecified hyperlipidemia type -     Comprehensive metabolic panel with GFR -     Lipid panel -     atorvastatin (LIPITOR) 20 MG tablet; Take 1 tablet (20 mg total) by mouth daily.  Type 2 diabetes mellitus with hyperglycemia, without long-term current use of insulin (HCC) -     Comprehensive metabolic panel with GFR -     Hemoglobin A1c -      Microalbumin/Creatinine Ratio, Urine  Carpal tunnel syndrome of right wrist  Osteoarthritis, unspecified osteoarthritis type, unspecified site  Right Achilles tendinitis  Former smoker  Senile purpura    Follow up: with PCP, pending labs

## 2024-10-18 LAB — MICROALBUMIN / CREATININE URINE RATIO
Creatinine, Urine: 102.3 mg/dL
Microalb/Creat Ratio: 166 mg/g{creat} — ABNORMAL HIGH (ref 0–29)
Microalbumin, Urine: 170 ug/mL

## 2024-10-18 LAB — COMPREHENSIVE METABOLIC PANEL WITH GFR
ALT: 31 IU/L (ref 0–44)
AST: 26 IU/L (ref 0–40)
Albumin: 4.6 g/dL (ref 3.9–4.9)
Alkaline Phosphatase: 91 IU/L (ref 47–123)
BUN/Creatinine Ratio: 20 (ref 10–24)
BUN: 13 mg/dL (ref 8–27)
Bilirubin Total: 1.7 mg/dL — ABNORMAL HIGH (ref 0.0–1.2)
CO2: 24 mmol/L (ref 20–29)
Calcium: 9.5 mg/dL (ref 8.6–10.2)
Chloride: 100 mmol/L (ref 96–106)
Creatinine, Ser: 0.66 mg/dL — ABNORMAL LOW (ref 0.76–1.27)
Globulin, Total: 2.2 g/dL (ref 1.5–4.5)
Glucose: 136 mg/dL — ABNORMAL HIGH (ref 70–99)
Potassium: 4.3 mmol/L (ref 3.5–5.2)
Sodium: 139 mmol/L (ref 134–144)
Total Protein: 6.8 g/dL (ref 6.0–8.5)
eGFR: 105 mL/min/1.73 (ref >59)

## 2024-10-18 LAB — HEMOGLOBIN A1C
Est. average glucose Bld gHb Est-mCnc: 169 mg/dL
Hgb A1c MFr Bld: 7.5 % — ABNORMAL HIGH (ref 4.8–5.6)

## 2024-10-18 LAB — LIPID PANEL
Chol/HDL Ratio: 2.7 ratio (ref 0.0–5.0)
Cholesterol, Total: 143 mg/dL (ref 100–199)
HDL: 53 mg/dL (ref >39)
LDL Chol Calc (NIH): 54 mg/dL (ref 0–99)
Triglycerides: 226 mg/dL — ABNORMAL HIGH (ref 0–149)
VLDL Cholesterol Cal: 36 mg/dL (ref 5–40)

## 2024-10-19 ENCOUNTER — Ambulatory Visit: Payer: Self-pay | Admitting: Medical

## 2024-10-19 NOTE — Progress Notes (Signed)
 Labs show diabetes marker 7.5% too high, triglycerides are elevated over 150, microalbumin kidney marker is abnormal.  Liver and electrolytes okay.  I recommend starting medication to help control blood sugars and to protect the kidney.  I would recommend adding a medication called Jardiance if this is covered by insurance.  This could help both the kidney and diabetes.  This is a once a day tablet.  You would see an increase in urination with this medication.  Let me know if agreeable  The plan will be to recheck in 3 months or follow-up as planned with your new provider in [place] at that time

## 2024-10-21 ENCOUNTER — Other Ambulatory Visit: Payer: Self-pay | Admitting: Medical

## 2024-10-21 MED ORDER — EMPAGLIFLOZIN 10 MG PO TABS
10.0000 mg | ORAL_TABLET | Freq: Every day | ORAL | 0 refills | Status: DC
Start: 2024-10-21 — End: 2024-10-22

## 2024-10-22 ENCOUNTER — Other Ambulatory Visit: Payer: Self-pay | Admitting: Medical

## 2024-10-22 ENCOUNTER — Telehealth: Payer: Self-pay | Admitting: Medical

## 2024-10-22 MED ORDER — GLIMEPIRIDE 1 MG PO TABS: 1.0000 mg | ORAL_TABLET | ORAL | 2 refills | Status: DC

## 2024-10-22 NOTE — Telephone Encounter (Unsigned)
 Copied from CRM #[id number]. Topic: Clinical - Medication Question >> Oct 22, 2024  3:49 PM Roselie BROCKS wrote: Reason for CRM: Patient was told by provider if he is unable to afford the cost of the empagliflozin  (JARDIANCE ) 10 MG TABS tablet Then he would try to find a lower cost alternative , patient requesting a return call concerning this

## 2024-10-23 NOTE — Telephone Encounter (Signed)
 Pt was notified of results

## 2024-11-21 ENCOUNTER — Other Ambulatory Visit: Payer: Self-pay | Admitting: Orthopedic Surgery

## 2024-12-08 ENCOUNTER — Encounter: Admitting: Orthopedic Surgery

## 2024-12-08 ENCOUNTER — Other Ambulatory Visit (INDEPENDENT_AMBULATORY_CARE_PROVIDER_SITE_OTHER)

## 2024-12-08 ENCOUNTER — Encounter: Payer: Self-pay | Admitting: Orthopedic Surgery

## 2024-12-08 VITALS — BP 122/80 | Ht 70.0 in | Wt 225.0 lb

## 2024-12-08 DIAGNOSIS — M5136 Other intervertebral disc degeneration, lumbar region with discogenic back pain only: Secondary | ICD-10-CM | POA: Diagnosis not present

## 2024-12-08 DIAGNOSIS — M1611 Unilateral primary osteoarthritis, right hip: Secondary | ICD-10-CM

## 2024-12-08 NOTE — Progress Notes (Signed)
" °  Intake history:  Chief Complaint  Patient presents with   Hip Pain    Right anterior   Back Pain    Right buttock area      BP 122/80 Comment: 10/17/24/ did not register today  Ht 5' 10 (1.778 m)   Wt 225 lb (102.1 kg)   BMI 32.28 kg/m  Body mass index is 32.28 kg/m.  Pharmacy? _____[facility] [address]_________________________________  WHAT ARE WE SEEING YOU FOR TODAY?   Right anterior hip pain / right side low back/ buttock pain  How long has this bothered you? (DOI?DOS?WS?)  Several months/ has pain at night can't get comfortable to sleep   Was there an injury? No  Anticoag.  No   Any ALLERGIES ________Allergies[1]  ____________________________________   Treatment:  Have you taken:  Tylenol  Yes  Advil  Yes  Had PT No  Had injection No  Other  _________________________        [1]  Allergies Allergen Reactions   Metformin And Related     Bad GI upset   "

## 2024-12-08 NOTE — Progress Notes (Signed)
 "  [name]  12/08/2024    ASSESSMENT AND PLAN:     Encounter Diagnoses  Name Primary?   Degeneration of intervertebral disc of lumbar region with discogenic back pain    Arthritis of right hip Yes    Arthur Aguilar 64 years old he has disabling right hip pain he would like to proceed with right total hip arthroplasty.  He prefers to direct anterior approach.  I will set him up with my associate and plan to assist him in surgery.  Chief Complaint  Patient presents with   Hip Pain    Right anterior   Back Pain    Right buttock area    Arthur Aguilar 64 years old he underwent successful left total hip arthroplasty via direct lateral approach approximately 15 years ago and has had no issues.  He takes Tylenol  and ibuprofen  at night for his disabling right hip pain.  His pain is increased with standing and is located in the anterior hip lateral hip and occasionally the buttock area.  He uses a cane at times especially if he is doing a lot of walking    Hip Pain   Back Pain      HISTORY SECTION :  Current Medications[1] Current Outpatient Medications  Medication Instructions   acetaminophen  (TYLENOL ) 1,000 mg, Every 6 hours PRN   atorvastatin  (LIPITOR) 20 mg, Oral, Daily   Fish Oil 2,000 mg, 2 times daily   Flaxseed Oil 1,200 mg, 2 times daily   glimepiride  (AMARYL ) 1 mg, Oral, BH-each morning   Glucosamine HCl (GLUCOSAMINE PO) 1,000 mg, Daily   ibuprofen  (ADVIL ) 800 mg, Every 8 hours PRN   Multiple Vitamins-Minerals (MENS 50+ MULTIVITAMIN PO) 1 tablet, Daily   naproxen  (NAPROSYN ) 500 mg, Oral, 2 times daily with meals   Turmeric Curcumin 500 mg, Daily    Allergies[2]   Review of Systems  Musculoskeletal:  Positive for back pain.     has a past medical history of Arthritis, GERD (gastroesophageal reflux disease), History of kidney stones, Hypercholesteremia, and Scoliosis.   Past Surgical History:  Procedure Laterality Date   CAPSULOTOMY Right 10/09/2018   Procedure: right  second  MPJ CAPSULOTOMY;  Surgeon: Tobie Buckles, DPM;  Location: [facility];  Service: Podiatry;  Laterality: Right;   CARPAL TUNNEL RELEASE Right 04/02/2015   Procedure: RIGHT CARPAL TUNNEL RELEASE;  Surgeon: Taft FORBES Minerva, MD;  Location: [facility];  Service: Orthopedics;  Laterality: Right;   COLONOSCOPY WITH PROPOFOL  N/A 07/21/2022   Procedure: COLONOSCOPY WITH PROPOFOL ;  Surgeon: Eartha Angelia Sieving, MD;  Location: [facility];  Service: Gastroenterology;  Laterality: N/A;  945 ASA 1   dental bone implant     bottom - right jaw.   FLEXOR TENOTOMY  Right 10/09/2018   Procedure: FLEXOR TENOTOMY;  Surgeon: Tobie Buckles, DPM;  Location: [facility];  Service: Podiatry;  Laterality: Right;   FOOT ARTHRODESIS Right 10/09/2018   Procedure: ARTHRODESIS 2ND DIGIT RIGHT FOOT;  Surgeon: Tobie Buckles, DPM;  Location: [facility];  Service: Podiatry;  Laterality: Right;   HEMOSTASIS CLIP PLACEMENT  07/21/2022   Procedure: HEMOSTASIS CLIP PLACEMENT;  Surgeon: Eartha Angelia Sieving, MD;  Location: [facility];  Service: Gastroenterology;;   HIP SURGERY     left hip replacement     POLYPECTOMY  07/21/2022   Procedure: POLYPECTOMY INTESTINAL;  Surgeon: Eartha Angelia Sieving, MD;  Location: [facility];  Service: Gastroenterology;;   SHOULDER ARTHROSCOPY WITH SUBACROMIAL DECOMPRESSION, ROTATOR CUFF REPAIR AND BICEP TENDON REPAIR  Right 01/31/2018   Procedure: Right Shoulder Arthroscopy, Biceps Tenodesis, Mini Open Rotator Cuff Tear Repair, Possible Superior Capsular Reconstruction;  Surgeon: Addie Cordella Hamilton, MD;  Location: [facility];  Service: Orthopedics;  Laterality: Right;   SHOULDER OPEN ROTATOR CUFF REPAIR Left 05/30/2017   Procedure: ROTATOR CUFF REPAIR SHOULDER OPEN;  Surgeon: Margrette Taft BRAVO, MD;  Location: [facility];  Service: Orthopedics;  Laterality: Left;   TENDON LENGTHENING Right 10/09/2018   Procedure: EXTENSOR TENDON LENGTHENING;  Surgeon: Tobie Buckles, DPM;   Location: [facility];  Service: Podiatry;  Laterality: Right;   TOOTH EXTRACTION  01/04/2018    1 tooth    Social History   Socioeconomic History   Marital status: Married    Spouse name: Not on file   Number of children: Not on file   Years of education: 12th grade   Highest education level: Not on file  Occupational History   Occupation: Probation Officer: [employer]  Tobacco Use   Smoking status: Former    Current packs/day: 0.00    Average packs/day: 0.5 packs/day for 25.0 years (12.5 ttl pk-yrs)    Types: Cigarettes    Start date: 03/29/1984    Quit date: 03/29/2009    Years since quitting: 15.7   Smokeless tobacco: Never  Vaping Use   Vaping status: Never Used  Substance and Sexual Activity   Alcohol use: Yes    Alcohol/week: 14.0 standard drinks of alcohol    Types: 14 Cans of beer per week    Comment: 1 beer per night   Drug use: No   Sexual activity: Yes    Birth control/protection: None  Other Topics Concern   Not on file  Social History Narrative   Not on file   Social Drivers of Health   Tobacco Use: Medium Risk (12/08/2024)   Patient History    Smoking Tobacco Use: Former    Smokeless Tobacco Use: Never    Passive Exposure: Not on Actuary Strain: Not on file  Food Insecurity: Not on file  Transportation Needs: Not on file  Physical Activity: Not on file  Stress: Not on file  Social Connections: Not on file  Intimate Partner Violence: Not on file  Depression (PHQ2-9): Low Risk (10/17/2024)   Depression (PHQ2-9)    PHQ-2 Score: 0  Alcohol Screen: Not on file  Housing: Not on file  Utilities: Not on file  Health Literacy: Not on file     Family History  Problem Relation Age of Onset   Heart disease Unknown    Arthritis Unknown    Lung disease Unknown    Diabetes Unknown       PHYSICAL EXAM SECTION: BP 122/80 Comment: 10/17/24/ did not register today  Ht 5' 10 (1.778 m)   Wt 225 lb (102.1 kg)   BMI 32.28  kg/m   Body mass index is 32.28 kg/m.   General appearance: Well-developed well-nourished no gross deformities  Eyes clear normal vision no evidence of conjunctivitis or jaundice, extraocular muscles intact  ENT: ears hearing normal, nasal passages clear, throat clear   Neck is supple without palpable mass, full range of motion   Cardiovascular normal pulse and perfusion in all 4 extremities normal color without edema  Lymph nodes: No lymphadenopathy  Neurologically deep tendon reflexes are equal and normal, no sensation loss or deficits no pathologic reflexes   Skin no lacerations or ulcerations no nodularity no palpable masses, no erythema or nodularity  Psychological: Awake alert and oriented x3 mood and affect normal  Musculoskeletal:   He had no back tenderness  His left hip was functioning normally  The right hip had decreased internal rotation especially at 90 degrees of flexion he had pain with internal rotation his leg lengths appear to be equal  Internal imaging today shows a scoliosis of his lumbar spine and osteoarthritis of his right hip  DG Lumbar Spine 2-3 Views Result Date: 12/08/2024 Buttock pain X-rays lumbar spine X-rays show small curve in the lumbar spine Anterior endplate changes L1-3 4 and 5 disc space narrowing  3 4 and L4-5 Impression Scoliosis lumbar Degenerative disc disease Facet arthritis   DG HIP UNILAT WITH PELVIS 2-3 VIEWS RIGHT Result Date: 12/08/2024 X-ray report Chief complaint anterior thigh pain hip pain buttock pain3 AP pelvis AP lateral right hip Reading: Left total hip noted Pistol-grip deformity noted right hip decreased femoral head-neck offset osteophyte formation and narrowing of the superolateral joint space Mild lumbar scoliosis with degenerative disc changes lumbar spine Impression: OA right hip, left total hip stable, degenerative disc disease and scoliosis    11:31 AM  Taft Minerva, MD        [1]  Current  Outpatient Medications:    acetaminophen  (TYLENOL ) 500 MG tablet, Take 1,000 mg by mouth every 6 (six) hours as needed for moderate pain. , Disp: , Rfl:    atorvastatin  (LIPITOR) 20 MG tablet, Take 1 tablet (20 mg total) by mouth daily., Disp: 90 tablet, Rfl: 1   Flaxseed, Linseed, (FLAXSEED OIL) 1200 MG CAPS, Take 1,200 mg by mouth 2 (two) times daily., Disp: , Rfl:    glimepiride  (AMARYL ) 1 MG tablet, Take 1 tablet (1 mg total) by mouth every morning., Disp: 30 tablet, Rfl: 2   Glucosamine HCl (GLUCOSAMINE PO), Take 1,000 mg by mouth daily. , Disp: , Rfl:    ibuprofen  (ADVIL ) 200 MG tablet, Take 800 mg by mouth every 8 (eight) hours as needed for moderate pain., Disp: , Rfl:    Multiple Vitamins-Minerals (MENS 50+ MULTIVITAMIN PO), Take 1 tablet by mouth daily., Disp: , Rfl:    naproxen  (NAPROSYN ) 500 MG tablet, TAKE 1 TABLET BY MOUTH TWICE A DAY WITH FOOD, Disp: 60 tablet, Rfl: 2   Omega-3 Fatty Acids (FISH OIL) 1000 MG CAPS, Take 2,000 mg by mouth 2 (two) times daily., Disp: , Rfl:    Turmeric Curcumin 500 MG CAPS, Take 500 mg by mouth daily., Disp: , Rfl:   Current Facility-Administered Medications:    ciprofloxacin  (CIPRO ) tablet 500 mg, 500 mg, Oral, Once, McKenzie, Belvie CROME, MD [2]  Allergies Allergen Reactions   Metformin And Related     Bad GI upset   "

## 2024-12-15 ENCOUNTER — Telehealth: Payer: Self-pay | Admitting: *Deleted

## 2024-12-15 NOTE — Telephone Encounter (Unsigned)
 Copied from CRM [id number]. Topic: Clinical - Request for Lab/Test Order >> Dec 15, 2024  8:51 AM Rea BROCKS wrote: Reason for CRM: Patient would like to know if his new pt appt on Jan 16 will be a fasting appt or not? He just wants to be sure before he comes in on Friday.    [phone] (M)

## 2024-12-15 NOTE — Telephone Encounter (Signed)
 Told pt he does not have to fast for his upcoming appointment

## 2024-12-17 ENCOUNTER — Encounter: Payer: Self-pay | Admitting: Orthopedic Surgery

## 2024-12-17 ENCOUNTER — Ambulatory Visit: Admitting: Orthopedic Surgery

## 2024-12-17 DIAGNOSIS — M1611 Unilateral primary osteoarthritis, right hip: Secondary | ICD-10-CM

## 2024-12-17 NOTE — Patient Instructions (Signed)
 Preoperative Instructions  Your surgery will be at [facility], scheduled with Dr Oneil Horde   The hospital will contact you with a preoperative appointment to discuss Anesthesia. The phone number is [phone]   Please bring your medications with you for the appointment.  They will tell you the arrival time and medication instructions when you have your preoperative evaluation.  Do not wear nail polish the day of your surgery and if you take Phentermine you need to stop this medication ONE WEEK prior to your surgery.    If you take an blood thinning medication, we will need to stop this prior to surgery.  Typically, we stop this medicine at least 5 days prior to surgery.  We will need to confirm this with the doctor who prescribes this medication.  If you are taking medications or an injection for diabetes, or for weight management, this medicine will need to be stopped at least 7 days prior to surgery.     Surgery will be scheduled for TBD pending authorization by your insurance company.   Pain medicine policy:  Per [facility] clinic policy, our goal is ensure optimal postoperative pain control with a multimodal pain management strategy.   For all [facility] patients, our goal is to wean post-operative narcotic medications by 6 weeks post-operatively.   If this is not possible due to utilization of pain medication prior to surgery, your [facility] doctor will support your acute post-operative pain control for the first 6 weeks postoperatively, with a plan to transition you back to your primary pain team following that.   Maralee will work to ensure a therapist, occupational.

## 2024-12-17 NOTE — Progress Notes (Signed)
 New Patient Visit  Summary: Arthur Aguilar is a 64 y.o. male with the following: Right hip arthritis  Assessment & Plan Right hip osteoarthritis Chronic right hip osteoarthritis with significant pain and functional limitation. Anterior approach total hip arthroplasty preferred for expedited recovery and reduced muscle disruption. Infection, blood loss, nerve irritation, and leg length discrepancy are risks. Intraoperative x-ray to optimize leg length. - Discussed anterior approach total hip arthroplasty for expedited recovery and minimal muscle disruption. - Reviewed anticipated overnight hospital stay, early mobilization, and minimal post-operative restrictions. - Explained use of intraoperative x-ray to optimize leg length, acknowledging potential for persistent discrepancy. - Discussed risks including infection, blood loss, nerve irritation, and leg length discrepancy. - Coordinated tentative surgery scheduling for February, pending preoperative medical clearance and glycemic assessment. - Explained need for post-operative home health therapy.  Type 2 diabetes mellitus Type 2 diabetes mellitus improved with lifestyle modification and weight loss. Adequate glycemic control required prior to surgery to minimize perioperative risks. Surgery postponed if A1c exceeds 7.5%. - Instructed to obtain repeat A1c prior to surgery; fasting not required. - Coordinated with primary care provider for preoperative clearance and updated A1c. - Reinforced importance of continued glycemic control and healthy lifestyle modifications in preparation for surgery.     Follow-up: Return for After medical clearance for OR.  Subjective:  Chief Complaint  Patient presents with   Hip Pain    R discuss possible surgery     Discussed the use of AI scribe software for clinical note transcription with the patient, who gave verbal consent to proceed.  History of Present Illness Arthur Aguilar is a 64 year  old male with right hip osteoarthritis who presents for evaluation of worsening right hip pain and consideration of right total hip arthroplasty.  He has progressive right hip pain now mainly in the groin, previously radiating to the back and lateral hip. Pain worsened after a recent fall onto his buttocks with increased anterior hip pain. He has marked difficulty walking, especially on uneven ground, and needs a cane or shopping cart for support. Prolonged standing worsens pain and he must lean or sit for relief. He feels right leg fatigue and uses shoe lifts to feel more level.  He had left total hip arthroplasty in January 2010 with good long-term outcome but recalls prolonged hospitalization and recovery. He prefers an anterior approach for the right hip to reduce muscle disruption and recovery time and expects to need home health therapy again after surgery. Prior orthopaedic surgeries include left shoulder, right shoulder, and carpal tunnel procedures. A recent Achilles strain limited activity before his current escalation in right hip symptoms.  He has type 2 diabetes with hemoglobin A1c 7.5% on October 17, 2024. He lost 15 pounds with diet changes and notes improved home glucose values. He cannot tolerate metformin or other diabetes medications due to gastrointestinal side effects and is currently diet controlled. He is arranging primary care follow-up for diabetes management and preoperative clearance.    Review of Systems: No fevers or chills No numbness or tingling No chest pain No shortness of breath No bowel or bladder dysfunction No GI distress No headaches   Medical History:  Past Medical History:  Diagnosis Date   Arthritis    GERD (gastroesophageal reflux disease)    History of kidney stones    Hypercholesteremia    Scoliosis     Past Surgical History:  Procedure Laterality Date   CAPSULOTOMY Right 10/09/2018   Procedure: right second  MPJ CAPSULOTOMY;  Surgeon:  Tobie Buckles, DPM;  Location: [facility];  Service: Podiatry;  Laterality: Right;   CARPAL TUNNEL RELEASE Right 04/02/2015   Procedure: RIGHT CARPAL TUNNEL RELEASE;  Surgeon: Taft FORBES Minerva, MD;  Location: [facility];  Service: Orthopedics;  Laterality: Right;   COLONOSCOPY WITH PROPOFOL  N/A 07/21/2022   Procedure: COLONOSCOPY WITH PROPOFOL ;  Surgeon: Eartha Angelia Sieving, MD;  [facility];  Service: Gastroenterology;  Laterality: N/A;  945 ASA 1   dental bone implant     bottom - right jaw.   FLEXOR TENOTOMY  Right 10/09/2018   Procedure: FLEXOR TENOTOMY;  Surgeon: Tobie Buckles, DPM;  Location: [facility];  Service: Podiatry;  Laterality: Right;   FOOT ARTHRODESIS Right 10/09/2018   Procedure: ARTHRODESIS 2ND DIGIT RIGHT FOOT;  Surgeon: Tobie Buckles, DPM;  Location: [facility];  Service: Podiatry;  Laterality: Right;   HEMOSTASIS CLIP PLACEMENT  07/21/2022   Procedure: HEMOSTASIS CLIP PLACEMENT;  Surgeon: Eartha Angelia Sieving, MD;  [facility];  Service: Gastroenterology;;   HIP SURGERY     left hip replacement     POLYPECTOMY  07/21/2022   Procedure: POLYPECTOMY INTESTINAL;  Surgeon: Eartha Angelia Sieving, MD;  [facility]: [facility];  Service: Gastroenterology;;   SHOULDER ARTHROSCOPY WITH SUBACROMIAL DECOMPRESSION, ROTATOR CUFF REPAIR AND BICEP TENDON REPAIR Right 01/31/2018   Procedure: Right Shoulder Arthroscopy, Biceps Tenodesis, Mini Open Rotator Cuff Tear Repair, Possible Superior Capsular Reconstruction;  Surgeon: Addie Cordella Hamilton, MD;  Location: [facility];  Service: Orthopedics;  Laterality: Right;   SHOULDER OPEN ROTATOR CUFF REPAIR Left 05/30/2017   Procedure: ROTATOR CUFF REPAIR SHOULDER OPEN;  Surgeon: Minerva Taft FORBES, MD;  Location: [facility];  Service: Orthopedics;  Laterality: Left;   TENDON LENGTHENING Right 10/09/2018   Procedure: EXTENSOR TENDON LENGTHENING;  Surgeon: Tobie Buckles, DPM;  Location: [facility];  Service: Podiatry;   Laterality: Right;   TOOTH EXTRACTION  01/04/2018    1 tooth    Family History  Problem Relation Age of Onset   Heart disease Unknown    Arthritis Unknown    Lung disease Unknown    Diabetes Unknown    Social History[1]  Allergies[2]  Active Medications[3]  Objective: There were no vitals taken for this visit.  Physical Exam:    General: Alert and oriented. and No acute distress. Gait: Right sided antalgic gait.  Physical Exam MEASUREMENTS: Weight- 215. MUSCULOSKELETAL: Limited internal rotation of right hip.  Tenderness over the anterior right hip.  Restricted extension.  Toes warm and well-perfused.  Active motion intact EHL/TA. NEUROLOGICAL: Sensation intact in foot.   IMAGING: I personally reviewed images previously obtained in clinic   X-rays of the right hip were previously obtained.  Complete loss of joint space.  Subchondral sclerosis.  There are cysts within the acetabulum as well as the femoral head.     New Medications:  No orders of the defined types were placed in this encounter.     Portions of this note were completed via Scientist, clinical (histocompatibility and immunogenetics).  Oneil DELENA Horde, MD  12/17/2024 9:19 AM      [1]  Social History Tobacco Use   Smoking status: Former    Current packs/day: 0.00    Average packs/day: 0.5 packs/day for 25.0 years (12.5 ttl pk-yrs)    Types: Cigarettes    Start date: 03/29/1984    Quit date: 03/29/2009    Years since quitting: 15.7   Smokeless tobacco: Never  Vaping Use   Vaping  status: Never Used  Substance Use Topics   Alcohol use: Yes    Alcohol/week: 14.0 standard drinks of alcohol    Types: 14 Cans of beer per week    Comment: 1 beer per night   Drug use: No  [2]  Allergies Allergen Reactions   Metformin And Related     Bad GI upset  [3]  Current Meds  Medication Sig   acetaminophen  (TYLENOL ) 500 MG tablet Take 1,000 mg by mouth every 6 (six) hours as needed for moderate pain.    atorvastatin  (LIPITOR)  20 MG tablet Take 1 tablet (20 mg total) by mouth daily.   Flaxseed, Linseed, (FLAXSEED OIL) 1200 MG CAPS Take 1,200 mg by mouth 2 (two) times daily.   glimepiride  (AMARYL ) 1 MG tablet Take 1 tablet (1 mg total) by mouth every morning.   Glucosamine HCl (GLUCOSAMINE PO) Take 1,000 mg by mouth daily.    ibuprofen  (ADVIL ) 200 MG tablet Take 800 mg by mouth every 8 (eight) hours as needed for moderate pain.   Multiple Vitamins-Minerals (MENS 50+ MULTIVITAMIN PO) Take 1 tablet by mouth daily.   naproxen  (NAPROSYN ) 500 MG tablet TAKE 1 TABLET BY MOUTH TWICE A DAY WITH FOOD   Omega-3 Fatty Acids (FISH OIL) 1000 MG CAPS Take 2,000 mg by mouth 2 (two) times daily.   Turmeric Curcumin 500 MG CAPS Take 500 mg by mouth daily.   Current Facility-Administered Medications for the 12/17/24 encounter (Office Visit) with Onesimo Oneil LABOR, MD  Medication   ciprofloxacin  (CIPRO ) tablet 500 mg

## 2024-12-19 ENCOUNTER — Ambulatory Visit: Admitting: Physician Assistant

## 2024-12-19 ENCOUNTER — Encounter: Payer: Self-pay | Admitting: Physician Assistant

## 2024-12-19 VITALS — BP 148/90 | HR 71 | Temp 98.1°F | Ht 70.0 in | Wt 219.8 lb

## 2024-12-19 DIAGNOSIS — I10 Essential (primary) hypertension: Secondary | ICD-10-CM

## 2024-12-19 DIAGNOSIS — R809 Proteinuria, unspecified: Secondary | ICD-10-CM

## 2024-12-19 DIAGNOSIS — E1129 Type 2 diabetes mellitus with other diabetic kidney complication: Secondary | ICD-10-CM

## 2024-12-19 DIAGNOSIS — M1611 Unilateral primary osteoarthritis, right hip: Secondary | ICD-10-CM | POA: Diagnosis not present

## 2024-12-19 DIAGNOSIS — Z7984 Long term (current) use of oral hypoglycemic drugs: Secondary | ICD-10-CM

## 2024-12-19 DIAGNOSIS — E785 Hyperlipidemia, unspecified: Secondary | ICD-10-CM | POA: Diagnosis not present

## 2024-12-19 MED ORDER — LISINOPRIL 10 MG PO TABS: 10.0000 mg | ORAL_TABLET | Freq: Every day | ORAL | 3 refills | Status: AC

## 2024-12-19 MED ORDER — ATORVASTATIN CALCIUM 20 MG PO TABS: 20.0000 mg | ORAL_TABLET | Freq: Every day | ORAL | 1 refills | Status: AC

## 2024-12-19 MED ORDER — LISINOPRIL 5 MG PO TABS: 5.0000 mg | ORAL_TABLET | Freq: Every day | ORAL | 3 refills | Status: DC

## 2024-12-19 NOTE — Assessment & Plan Note (Signed)
 148/90 Uncontrolled. Starting lisinopril  10 mg daily for blood pressure control and management of proteinuria.  Discussed DASH diet and dietary sodium restrictions.  Continue dietary efforts and physical activity. Follow up in one month for re-evaluation. BMP in 10 days.

## 2024-12-19 NOTE — Patient Instructions (Signed)
 A1c on 2/16, follow up with me on 2/17

## 2024-12-19 NOTE — Assessment & Plan Note (Signed)
 Stable. Continue with current management without changes. Discussed healthy diet and lifestyle.

## 2024-12-19 NOTE — Assessment & Plan Note (Addendum)
 Type 2 diabetes mellitus with elevated A1c in November. He has made significant dietary changes, resulting in a 15-pound weight loss. Blood sugar levels are being monitored daily, fasting sugar of 105 this morning. Proteinuria is present on urine ARC in November, likely related to elevated blood sugars and hypertension, indicating potential kidney complications requiring intervention. Blood pressure is elevated, contributing to kidney risk. - Will order A1c test to be on or after 2/14. - Will schedule follow-up visit on February 17th, 2026, for surgical clearance and A1c discussion.  - Prescribed lisinopril  to protect kidneys and manage blood pressure. - Will check electrolytes in one to two weeks. - Encouraged continuation of dietary changes and weight loss efforts. - Recommended yearly eye exam to monitor for diabetic retinopathy.

## 2024-12-19 NOTE — Progress Notes (Signed)
 "  New Patient Office Visit  Subjective    Patient ID: Arthur Aguilar, male    DOB: December 02, 1961  Age: 64 y.o. MRN: [id number]  CC:  Chief Complaint  Patient presents with   Establish Care    PT is requesting A1C recheck to get approved for his surgery - has not fasted   Blood Sugar Problem    Pt has cut out a lot of sugary foods and has lost 10 lbs since November     HPI Arthur Aguilar presents to establish care  Discussed the use of AI scribe software for clinical note transcription with the patient, who gave verbal consent to proceed.  History of Present Illness Arthur Aguilar is a 64 year old male with diabetes and hyperlipidemia who presents to establish care and for medication management.  He was previously seen in [facility] in November and is now seeking to continue his care locally. He is currently taking atorvastatin  for cholesterol management, which was last checked in November and showed good results.  His last A1c was high in November at 7.5, and he has since made significant dietary changes to manage his blood sugar levels. He has stopped consuming sugars, cereals, bananas, sweet tea, pasta, rice, and cookies. Instead, he commonly eats boiled eggs, chicken breast, cheese, vegetables, broccoli, and salads. He has lost 15 pounds since November and monitors his blood sugar daily, reporting improved levels. He previously experienced 'brain fog' when his sugar was high, but this has improved with dietary changes. He is not currently on any medication for his diabetes due to adverse effects from previous medications like metformin and glimepiride . His labs in November were also significant for proteinuria.  He attributes his elevated blood pressure to work stress and caffeine intake. He works a stressful job from home, starting at 5:30 AM, and has been receiving work-related texts during the visit. He quit smoking 15 years ago when he had his left hip replaced in 2010. No recent leg  swelling, chest pain, or shortness of breath. He has not seen an eye doctor in a long time and has never received a flu shot. He is awaiting a right hip replacement and is in need for surgical clearance before this operation.     Outpatient Encounter Medications as of 12/19/2024  Medication Sig   lisinopril  (ZESTRIL ) 10 MG tablet Take 1 tablet (10 mg total) by mouth daily.   [DISCONTINUED] lisinopril  (ZESTRIL ) 5 MG tablet Take 1 tablet (5 mg total) by mouth daily.   acetaminophen  (TYLENOL ) 500 MG tablet Take 1,000 mg by mouth every 6 (six) hours as needed for moderate pain.    atorvastatin  (LIPITOR) 20 MG tablet Take 1 tablet (20 mg total) by mouth daily.   Flaxseed, Linseed, (FLAXSEED OIL) 1200 MG CAPS Take 1,200 mg by mouth 2 (two) times daily.   Glucosamine HCl (GLUCOSAMINE PO) Take 1,000 mg by mouth daily.    ibuprofen  (ADVIL ) 200 MG tablet Take 800 mg by mouth every 8 (eight) hours as needed for moderate pain.   Multiple Vitamins-Minerals (MENS 50+ MULTIVITAMIN PO) Take 1 tablet by mouth daily.   naproxen  (NAPROSYN ) 500 MG tablet TAKE 1 TABLET BY MOUTH TWICE A DAY WITH FOOD   Omega-3 Fatty Acids (FISH OIL) 1000 MG CAPS Take 2,000 mg by mouth 2 (two) times daily.   Turmeric Curcumin 500 MG CAPS Take 500 mg by mouth daily.   [DISCONTINUED] atorvastatin  (LIPITOR) 20 MG tablet Take 1 tablet (20 mg total) by  mouth daily.   [DISCONTINUED] glimepiride  (AMARYL ) 1 MG tablet Take 1 tablet (1 mg total) by mouth every morning.   [DISCONTINUED] ciprofloxacin  (CIPRO ) tablet 500 mg    No facility-administered encounter medications on file as of 12/19/2024.    Past Medical History:  Diagnosis Date   Arthritis    GERD (gastroesophageal reflux disease)    History of kidney stones    Hypercholesteremia    Scoliosis     Past Surgical History:  Procedure Laterality Date   CAPSULOTOMY Right 10/09/2018   Procedure: right second  MPJ CAPSULOTOMY;  Surgeon: Tobie Buckles, DPM;  Location: [facility];   Service: Podiatry;  Laterality: Right;   CARPAL TUNNEL RELEASE Right 04/02/2015   Procedure: RIGHT CARPAL TUNNEL RELEASE;  Surgeon: Taft FORBES Minerva, MD;  Location: [facility];  Service: Orthopedics;  Laterality: Right;   COLONOSCOPY WITH PROPOFOL  N/A 07/21/2022   Procedure: COLONOSCOPY WITH PROPOFOL ;  Surgeon: Eartha Angelia Sieving, MD;  Location: [facility];  Service: Gastroenterology;  Laterality: N/A;  945 ASA 1   dental bone implant     bottom - right jaw.   FLEXOR TENOTOMY  Right 10/09/2018   Procedure: FLEXOR TENOTOMY;  Surgeon: Tobie Buckles, DPM;  Location: [facility];  Service: Podiatry;  Laterality: Right;   FOOT ARTHRODESIS Right 10/09/2018   Procedure: ARTHRODESIS 2ND DIGIT RIGHT FOOT;  Surgeon: Tobie Buckles, DPM;  Location: [facility];  Service: Podiatry;  Laterality: Right;   HEMOSTASIS CLIP PLACEMENT  07/21/2022   Procedure: HEMOSTASIS CLIP PLACEMENT;  Surgeon: Eartha Angelia Sieving, MD;  Location: [facility];  Service: Gastroenterology;;   HIP SURGERY     left hip replacement     POLYPECTOMY  07/21/2022   Procedure: POLYPECTOMY INTESTINAL;  Surgeon: Eartha Angelia Sieving, MD;  Location: [facility];  Service: Gastroenterology;;   SHOULDER ARTHROSCOPY WITH SUBACROMIAL DECOMPRESSION, ROTATOR CUFF REPAIR AND BICEP TENDON REPAIR Right 01/31/2018   Procedure: Right Shoulder Arthroscopy, Biceps Tenodesis, Mini Open Rotator Cuff Tear Repair, Possible Superior Capsular Reconstruction;  Surgeon: Addie Cordella Hamilton, MD;  Location: [facility];  Service: Orthopedics;  Laterality: Right;   SHOULDER OPEN ROTATOR CUFF REPAIR Left 05/30/2017   Procedure: ROTATOR CUFF REPAIR SHOULDER OPEN;  Surgeon: Minerva Taft FORBES, MD;  Location: [facility];  Service: Orthopedics;  Laterality: Left;   TENDON LENGTHENING Right 10/09/2018   Procedure: EXTENSOR TENDON LENGTHENING;  Surgeon: Tobie Buckles, DPM;  Location: [facility];  Service: Podiatry;  Laterality: Right;   TOOTH EXTRACTION   01/04/2018    1 tooth    Family History  Problem Relation Age of Onset   Heart disease Unknown    Arthritis Unknown    Lung disease Unknown    Diabetes Unknown     Social History   Socioeconomic History   Marital status: Married    Spouse name: Not on file   Number of children: Not on file   Years of education: 12th grade   Highest education level: Not on file  Occupational History   Occupation: Probation Officer: [employer]  Tobacco Use   Smoking status: Former    Current packs/day: 0.00    Average packs/day: 0.5 packs/day for 25.0 years (12.5 ttl pk-yrs)    Types: Cigarettes    Start date: 03/29/1984    Quit date: 03/29/2009    Years since quitting: 15.7   Smokeless tobacco: Never  Vaping Use   Vaping status: Never Used  Substance and Sexual Activity   Alcohol use:  Yes    Alcohol/week: 14.0 standard drinks of alcohol    Types: 14 Cans of beer per week    Comment: 1 beer per night   Drug use: No   Sexual activity: Yes    Birth control/protection: None  Other Topics Concern   Not on file  Social History Narrative   Not on file   Social Drivers of Health   Tobacco Use: Medium Risk (12/17/2024)   Patient History    Smoking Tobacco Use: Former    Smokeless Tobacco Use: Never    Passive Exposure: Not on Actuary Strain: Not on file  Food Insecurity: Not on file  Transportation Needs: Not on file  Physical Activity: Not on file  Stress: Not on file  Social Connections: Not on file  Intimate Partner Violence: Not on file  Depression (PHQ2-9): Low Risk (12/19/2024)   Depression (PHQ2-9)    PHQ-2 Score: 0  Alcohol Screen: Not on file  Housing: Not on file  Utilities: Not on file  Health Literacy: Not on file    Review of Systems  Constitutional:  Negative for activity change, appetite change, fatigue and fever.  Eyes:  Negative for visual disturbance.  Respiratory:  Negative for cough and shortness of breath.    Cardiovascular:  Negative for chest pain.  Musculoskeletal:  Positive for arthralgias. Negative for back pain, gait problem and myalgias.  Neurological:  Negative for light-headedness and headaches.         Objective    BP (!) 148/90 (BP Location: Left Arm, Cuff Size: Normal)   Pulse 71   Temp 98.1 F (36.7 C)   Ht 5' 10 (1.778 m)   Wt 219 lb 12.8 oz (99.7 kg)   SpO2 96%   BMI 31.54 kg/m   Physical Exam Constitutional:      General: He is not in acute distress.    Appearance: Normal appearance. He is obese. He is not ill-appearing.  HENT:     Head: Normocephalic and atraumatic.     Mouth/Throat:     Mouth: Mucous membranes are moist.     Pharynx: Oropharynx is clear.  Eyes:     Extraocular Movements: Extraocular movements intact.     Conjunctiva/sclera: Conjunctivae normal.  Cardiovascular:     Rate and Rhythm: Normal rate and regular rhythm.     Heart sounds: Normal heart sounds. No murmur heard. Pulmonary:     Effort: Pulmonary effort is normal.     Breath sounds: Normal breath sounds. No wheezing, rhonchi or rales.  Musculoskeletal:     Right lower leg: No edema.     Left lower leg: No edema.  Skin:    General: Skin is warm and dry.  Neurological:     General: No focal deficit present.     Mental Status: He is alert and oriented to person, place, and time.  Psychiatric:        Mood and Affect: Mood normal.        Behavior: Behavior normal.        Assessment & Plan:  Type 2 diabetes mellitus with diabetic microalbuminuria, without long-term current use of insulin (HCC) Assessment & Plan: Type 2 diabetes mellitus with elevated A1c in November. He has made significant dietary changes, resulting in a 15-pound weight loss. Blood sugar levels are being monitored daily, fasting sugar of 105 this morning. Proteinuria is present on urine ARC in November, likely related to elevated blood sugars and hypertension, indicating potential kidney complications  requiring  intervention. Blood pressure is elevated, contributing to kidney risk. - Will order A1c test to be on or after 2/14. - Will schedule follow-up visit on February 17th, 2026, for surgical clearance and A1c discussion.  - Prescribed lisinopril  to protect kidneys and manage blood pressure. - Will check electrolytes in one to two weeks. - Encouraged continuation of dietary changes and weight loss efforts. - Recommended yearly eye exam to monitor for diabetic retinopathy.  Orders: -     Hemoglobin A1c -     Lisinopril ; Take 1 tablet (10 mg total) by mouth daily.  Dispense: 90 tablet; Refill: 3  Primary hypertension Assessment & Plan: 148/90 Uncontrolled. Starting lisinopril  10 mg daily for blood pressure control and management of proteinuria.  Discussed DASH diet and dietary sodium restrictions.  Continue dietary efforts and physical activity. Follow up in one month for re-evaluation. BMP in 10 days.   Orders: -     Lisinopril ; Take 1 tablet (10 mg total) by mouth daily.  Dispense: 90 tablet; Refill: 3 -     Basic metabolic panel with GFR  Primary osteoarthritis of right hip Assessment & Plan: Primary osteoarthritis of the right hip causing difficulty with ambulation, especially on uneven surfaces. He is planning surgery and is working on weight loss to improve outcomes. - Plan for surgical intervention for right hip osteoarthritis. - Encouraged weight loss and dietary changes to improve surgical outcomes. - Follow up for surgical clearance appointment following A1c in February.     Hyperlipidemia, unspecified hyperlipidemia type Assessment & Plan: Stable. Continue with current management without changes. Discussed healthy diet and lifestyle.   Orders: -     Atorvastatin  Calcium ; Take 1 tablet (20 mg total) by mouth daily.  Dispense: 90 tablet; Refill: 1   Return in about 1 month (around 01/20/2025) for surg clearnace.   Drucilla Cumber, PA-C  "

## 2024-12-19 NOTE — Assessment & Plan Note (Signed)
 Primary osteoarthritis of the right hip causing difficulty with ambulation, especially on uneven surfaces. He is planning surgery and is working on weight loss to improve outcomes. - Plan for surgical intervention for right hip osteoarthritis. - Encouraged weight loss and dietary changes to improve surgical outcomes. - Follow up for surgical clearance appointment following A1c in February.

## 2025-01-20 ENCOUNTER — Ambulatory Visit: Admitting: Physician Assistant
